# Patient Record
Sex: Male | Born: 1960 | Race: White | Hispanic: No | Smoking: Former smoker
Health system: Southern US, Community
[De-identification: ages and names within clinical notes are randomized; demographics above are authoritative.]

## PROBLEM LIST (undated history)

## (undated) DIAGNOSIS — Q8901 Asplenia (congenital): Secondary | ICD-10-CM

## (undated) DIAGNOSIS — C4370 Malignant melanoma of unspecified lower limb, including hip: Secondary | ICD-10-CM

## (undated) DIAGNOSIS — C169 Malignant neoplasm of stomach, unspecified: Secondary | ICD-10-CM

## (undated) DIAGNOSIS — S0292XA Unspecified fracture of facial bones, initial encounter for closed fracture: Secondary | ICD-10-CM

## (undated) DIAGNOSIS — C4491 Basal cell carcinoma of skin, unspecified: Secondary | ICD-10-CM

## (undated) DIAGNOSIS — C44621 Squamous cell carcinoma of skin of unspecified upper limb, including shoulder: Secondary | ICD-10-CM

## (undated) DIAGNOSIS — N529 Male erectile dysfunction, unspecified: Secondary | ICD-10-CM

## (undated) DIAGNOSIS — K219 Gastro-esophageal reflux disease without esophagitis: Secondary | ICD-10-CM

## (undated) DIAGNOSIS — Z8619 Personal history of other infectious and parasitic diseases: Secondary | ICD-10-CM

## (undated) DIAGNOSIS — T7840XA Allergy, unspecified, initial encounter: Secondary | ICD-10-CM

## (undated) DIAGNOSIS — M1990 Unspecified osteoarthritis, unspecified site: Secondary | ICD-10-CM

## (undated) DIAGNOSIS — C799 Secondary malignant neoplasm of unspecified site: Secondary | ICD-10-CM

## (undated) HISTORY — PX: NASAL SINUS SURGERY: SHX719

## (undated) HISTORY — DX: Unspecified fracture of facial bones, initial encounter for closed fracture: S02.92XA

## (undated) HISTORY — DX: Asplenia (congenital): Q89.01

## (undated) HISTORY — DX: Personal history of other infectious and parasitic diseases: Z86.19

## (undated) HISTORY — DX: Squamous cell carcinoma of skin of unspecified upper limb, including shoulder: C44.621

## (undated) HISTORY — DX: Malignant melanoma of unspecified lower limb, including hip: C43.70

## (undated) HISTORY — PX: ORIF ORBITAL FRACTURE: SHX5312

## (undated) HISTORY — PX: PALATE SURGERY: SHX729

## (undated) HISTORY — DX: Gastro-esophageal reflux disease without esophagitis: K21.9

## (undated) HISTORY — PX: MANDIBLE SURGERY: SHX707

## (undated) HISTORY — DX: Male erectile dysfunction, unspecified: N52.9

## (undated) HISTORY — DX: Basal cell carcinoma of skin, unspecified: C44.91

## (undated) HISTORY — DX: Unspecified osteoarthritis, unspecified site: M19.90

## (undated) HISTORY — DX: Allergy, unspecified, initial encounter: T78.40XA

---

## 1997-12-15 HISTORY — PX: MELANOMA EXCISION WITH SENTINEL LYMPH NODE BIOPSY: SHX5267

## 2005-07-15 ENCOUNTER — Ambulatory Visit: Payer: Self-pay | Admitting: Family Medicine

## 2005-08-29 ENCOUNTER — Ambulatory Visit: Payer: Self-pay | Admitting: Family Medicine

## 2005-10-13 ENCOUNTER — Ambulatory Visit: Payer: Self-pay | Admitting: Family Medicine

## 2005-10-27 ENCOUNTER — Encounter: Admission: RE | Admit: 2005-10-27 | Discharge: 2005-10-27 | Payer: Self-pay | Admitting: Family Medicine

## 2005-12-24 ENCOUNTER — Ambulatory Visit: Payer: Self-pay | Admitting: Family Medicine

## 2006-05-23 ENCOUNTER — Emergency Department (HOSPITAL_COMMUNITY): Admission: EM | Admit: 2006-05-23 | Discharge: 2006-05-23 | Payer: Self-pay | Admitting: Emergency Medicine

## 2006-12-15 HISTORY — PX: SPLENECTOMY, TOTAL: SHX788

## 2007-05-03 ENCOUNTER — Ambulatory Visit: Payer: Self-pay | Admitting: Family Medicine

## 2007-05-11 ENCOUNTER — Ambulatory Visit: Payer: Self-pay | Admitting: Family Medicine

## 2007-05-11 LAB — CONVERTED CEMR LAB
ALT: 20 units/L (ref 0–40)
AST: 22 units/L (ref 0–37)
Albumin: 4 g/dL (ref 3.5–5.2)
Alkaline Phosphatase: 60 units/L (ref 39–117)
BUN: 7 mg/dL (ref 6–23)
Basophils Absolute: 0 10*3/uL (ref 0.0–0.1)
Basophils Relative: 0.5 % (ref 0.0–1.0)
Bilirubin, Direct: 0.1 mg/dL (ref 0.0–0.3)
CO2: 33 meq/L — ABNORMAL HIGH (ref 19–32)
Calcium: 8.6 mg/dL (ref 8.4–10.5)
Chloride: 106 meq/L (ref 96–112)
Cholesterol: 191 mg/dL (ref 0–200)
Creatinine, Ser: 1.1 mg/dL (ref 0.4–1.5)
Eosinophils Absolute: 0.1 10*3/uL (ref 0.0–0.6)
Eosinophils Relative: 1.6 % (ref 0.0–5.0)
GFR calc Af Amer: 94 mL/min
GFR calc non Af Amer: 78 mL/min
Glucose, Bld: 91 mg/dL (ref 70–99)
HCT: 43.7 % (ref 39.0–52.0)
HDL: 45.4 mg/dL (ref >39.0)
Hemoglobin: 15.1 g/dL (ref 13.0–17.0)
LDL Cholesterol: 111 mg/dL — ABNORMAL HIGH (ref 0–99)
Lymphocytes Relative: 42.8 % (ref 12.0–46.0)
MCHC: 34.5 g/dL (ref 30.0–36.0)
MCV: 92.2 fL (ref 78.0–100.0)
Monocytes Absolute: 0.8 10*3/uL — ABNORMAL HIGH (ref 0.2–0.7)
Monocytes Relative: 10.3 % (ref 3.0–11.0)
Neutro Abs: 3.3 10*3/uL (ref 1.4–7.7)
Neutrophils Relative %: 44.8 % (ref 43.0–77.0)
Platelets: 205 10*3/uL (ref 150–400)
Potassium: 3.1 meq/L — ABNORMAL LOW (ref 3.5–5.1)
RBC: 4.74 M/uL (ref 4.22–5.81)
RDW: 12.7 % (ref 11.5–14.6)
Sodium: 143 meq/L (ref 135–145)
TSH: 1.75 microintl units/mL (ref 0.35–5.50)
Total Bilirubin: 1 mg/dL (ref 0.3–1.2)
Total CHOL/HDL Ratio: 4.2
Total Protein: 6.4 g/dL (ref 6.0–8.3)
Triglycerides: 171 mg/dL — ABNORMAL HIGH (ref 0–149)
VLDL: 34 mg/dL (ref 0–40)
WBC: 7.4 10*3/uL (ref 4.5–10.5)

## 2007-05-18 ENCOUNTER — Ambulatory Visit: Payer: Self-pay | Admitting: Family Medicine

## 2007-08-25 ENCOUNTER — Observation Stay (HOSPITAL_COMMUNITY): Admission: EM | Admit: 2007-08-25 | Discharge: 2007-08-26 | Payer: Self-pay | Admitting: *Deleted

## 2007-08-25 ENCOUNTER — Ambulatory Visit: Payer: Self-pay | Admitting: Family Medicine

## 2007-08-25 ENCOUNTER — Ambulatory Visit: Payer: Self-pay | Admitting: Internal Medicine

## 2007-08-25 DIAGNOSIS — R079 Chest pain, unspecified: Secondary | ICD-10-CM | POA: Insufficient documentation

## 2007-09-01 ENCOUNTER — Encounter: Payer: Self-pay | Admitting: Family Medicine

## 2007-09-01 ENCOUNTER — Ambulatory Visit: Payer: Self-pay

## 2007-11-04 ENCOUNTER — Encounter: Admission: RE | Admit: 2007-11-04 | Discharge: 2007-11-16 | Payer: Self-pay | Admitting: Neurology

## 2007-11-09 ENCOUNTER — Ambulatory Visit: Payer: Self-pay | Admitting: Family Medicine

## 2007-11-12 ENCOUNTER — Telehealth: Payer: Self-pay | Admitting: Family Medicine

## 2007-11-15 ENCOUNTER — Telehealth: Payer: Self-pay | Admitting: Family Medicine

## 2007-12-16 HISTORY — PX: CHOLECYSTECTOMY: SHX55

## 2008-02-28 ENCOUNTER — Ambulatory Visit: Payer: Self-pay | Admitting: Internal Medicine

## 2008-02-28 DIAGNOSIS — L259 Unspecified contact dermatitis, unspecified cause: Secondary | ICD-10-CM | POA: Insufficient documentation

## 2008-05-05 ENCOUNTER — Ambulatory Visit: Payer: Self-pay | Admitting: Internal Medicine

## 2008-05-05 LAB — CONVERTED CEMR LAB
ALT: 26 units/L (ref 0–53)
AST: 30 units/L (ref 0–37)
Albumin: 4.6 g/dL (ref 3.5–5.2)
Alkaline Phosphatase: 86 units/L (ref 39–117)
BUN: 6 mg/dL (ref 6–23)
Basophils Absolute: 0.1 10*3/uL (ref 0.0–0.1)
Basophils Relative: 0.7 % (ref 0.0–1.0)
Bilirubin, Direct: 0.1 mg/dL (ref 0.0–0.3)
Blood in Urine, dipstick: NEGATIVE
CO2: 31 meq/L (ref 19–32)
Calcium: 9.7 mg/dL (ref 8.4–10.5)
Chloride: 105 meq/L (ref 96–112)
Cholesterol: 190 mg/dL (ref 0–200)
Creatinine, Ser: 1.1 mg/dL (ref 0.4–1.5)
Eosinophils Absolute: 0.3 10*3/uL (ref 0.0–0.7)
Eosinophils Relative: 4.6 % (ref 0.0–5.0)
GFR calc Af Amer: 94 mL/min
GFR calc non Af Amer: 77 mL/min
Glucose, Bld: 78 mg/dL (ref 70–99)
Glucose, Urine, Semiquant: NEGATIVE
HCT: 47.7 % (ref 39.0–52.0)
HDL: 44.5 mg/dL (ref >39.0)
Hemoglobin: 16.1 g/dL (ref 13.0–17.0)
LDL Cholesterol: 118 mg/dL — ABNORMAL HIGH (ref 0–99)
Lymphocytes Relative: 24.9 % (ref 12.0–46.0)
MCHC: 33.8 g/dL (ref 30.0–36.0)
MCV: 93.7 fL (ref 78.0–100.0)
Monocytes Absolute: 1 10*3/uL (ref 0.1–1.0)
Monocytes Relative: 13.2 % — ABNORMAL HIGH (ref 3.0–12.0)
Neutro Abs: 4.2 10*3/uL (ref 1.4–7.7)
Neutrophils Relative %: 56.6 % (ref 43.0–77.0)
Nitrite: NEGATIVE
Platelets: 293 10*3/uL (ref 150–400)
Potassium: 4 meq/L (ref 3.5–5.1)
RBC: 5.09 M/uL (ref 4.22–5.81)
RDW: 13.6 % (ref 11.5–14.6)
Sodium: 138 meq/L (ref 135–145)
Specific Gravity, Urine: >=1.03
TSH: 1.51 microintl units/mL (ref 0.35–5.50)
Total Bilirubin: 2.9 mg/dL — ABNORMAL HIGH (ref 0.3–1.2)
Total CHOL/HDL Ratio: 4.3
Total Protein: 7.5 g/dL (ref 6.0–8.3)
Triglycerides: 137 mg/dL (ref 0–149)
Urobilinogen, UA: 0.2
VLDL: 27 mg/dL (ref 0–40)
WBC Urine, dipstick: NEGATIVE
WBC: 7.4 10*3/uL (ref 4.5–10.5)
pH: 5

## 2008-05-07 DIAGNOSIS — K1120 Sialoadenitis, unspecified: Secondary | ICD-10-CM | POA: Insufficient documentation

## 2008-05-10 ENCOUNTER — Ambulatory Visit: Payer: Self-pay | Admitting: Family Medicine

## 2008-05-10 DIAGNOSIS — J309 Allergic rhinitis, unspecified: Secondary | ICD-10-CM | POA: Insufficient documentation

## 2008-05-10 DIAGNOSIS — C44621 Squamous cell carcinoma of skin of unspecified upper limb, including shoulder: Secondary | ICD-10-CM | POA: Insufficient documentation

## 2008-05-10 DIAGNOSIS — K8020 Calculus of gallbladder without cholecystitis without obstruction: Secondary | ICD-10-CM | POA: Insufficient documentation

## 2008-05-18 ENCOUNTER — Encounter: Payer: Self-pay | Admitting: Family Medicine

## 2008-05-19 ENCOUNTER — Ambulatory Visit: Payer: Self-pay | Admitting: Family Medicine

## 2008-05-19 DIAGNOSIS — F528 Other sexual dysfunction not due to a substance or known physiological condition: Secondary | ICD-10-CM | POA: Insufficient documentation

## 2008-05-23 ENCOUNTER — Telehealth: Payer: Self-pay | Admitting: Family Medicine

## 2008-09-14 HISTORY — PX: OTHER SURGICAL HISTORY: SHX169

## 2009-01-11 ENCOUNTER — Telehealth: Payer: Self-pay | Admitting: *Deleted

## 2009-09-21 ENCOUNTER — Ambulatory Visit: Payer: Self-pay | Admitting: Family Medicine

## 2009-09-21 DIAGNOSIS — L301 Dyshidrosis [pompholyx]: Secondary | ICD-10-CM | POA: Insufficient documentation

## 2009-09-21 DIAGNOSIS — D179 Benign lipomatous neoplasm, unspecified: Secondary | ICD-10-CM | POA: Insufficient documentation

## 2009-09-21 LAB — CONVERTED CEMR LAB
ALT: 35 units/L (ref 0–53)
AST: 32 units/L (ref 0–37)
Albumin: 4.4 g/dL (ref 3.5–5.2)
Alkaline Phosphatase: 89 units/L (ref 39–117)
BUN: 7 mg/dL (ref 6–23)
Basophils Absolute: 0 10*3/uL (ref 0.0–0.1)
Basophils Relative: 0.4 % (ref 0.0–3.0)
Bilirubin Urine: NEGATIVE
Bilirubin, Direct: 0.2 mg/dL (ref 0.0–0.3)
Blood in Urine, dipstick: NEGATIVE
CO2: 22 meq/L (ref 19–32)
CRP, High Sensitivity: 8.3 — ABNORMAL HIGH (ref 0.00–5.00)
Calcium: 9.8 mg/dL (ref 8.4–10.5)
Chloride: 103 meq/L (ref 96–112)
Cholesterol: 180 mg/dL (ref 0–200)
Creatinine, Ser: 1.2 mg/dL (ref 0.4–1.5)
Eosinophils Absolute: 0.2 10*3/uL (ref 0.0–0.7)
Eosinophils Relative: 3.1 % (ref 0.0–5.0)
GFR calc non Af Amer: 69.3 mL/min (ref >60)
Glucose, Bld: 92 mg/dL (ref 70–99)
Glucose, Urine, Semiquant: NEGATIVE
HCT: 44.7 % (ref 39.0–52.0)
HDL: 42.6 mg/dL (ref >39.00)
Hemoglobin: 15.6 g/dL (ref 13.0–17.0)
Ketones, urine, test strip: NEGATIVE
LDL Cholesterol: 119 mg/dL — ABNORMAL HIGH (ref 0–99)
Lymphocytes Relative: 30.4 % (ref 12.0–46.0)
Lymphs Abs: 2.4 10*3/uL (ref 0.7–4.0)
MCHC: 34.8 g/dL (ref 30.0–36.0)
MCV: 93.2 fL (ref 78.0–100.0)
Monocytes Absolute: 0.9 10*3/uL (ref 0.1–1.0)
Monocytes Relative: 11.5 % (ref 3.0–12.0)
Neutro Abs: 4.4 10*3/uL (ref 1.4–7.7)
Neutrophils Relative %: 54.6 % (ref 43.0–77.0)
Nitrite: NEGATIVE
Platelets: 282 10*3/uL (ref 150.0–400.0)
Potassium: 5.2 meq/L — ABNORMAL HIGH (ref 3.5–5.1)
Protein, U semiquant: NEGATIVE
RBC: 4.79 M/uL (ref 4.22–5.81)
RDW: 12.6 % (ref 11.5–14.6)
Sed Rate: 6 mm/hr (ref 0–22)
Sodium: 139 meq/L (ref 135–145)
Specific Gravity, Urine: 1.02
TSH: 1.21 microintl units/mL (ref 0.35–5.50)
Total Bilirubin: 1.3 mg/dL — ABNORMAL HIGH (ref 0.3–1.2)
Total CHOL/HDL Ratio: 4
Total Protein: 7.3 g/dL (ref 6.0–8.3)
Triglycerides: 93 mg/dL (ref 0.0–149.0)
Urobilinogen, UA: 0.2
VLDL: 18.6 mg/dL (ref 0.0–40.0)
WBC Urine, dipstick: NEGATIVE
WBC: 7.9 10*3/uL (ref 4.5–10.5)
pH: 5

## 2009-09-24 ENCOUNTER — Telehealth: Payer: Self-pay | Admitting: Family Medicine

## 2009-09-25 ENCOUNTER — Encounter: Admission: RE | Admit: 2009-09-25 | Discharge: 2009-09-25 | Payer: Self-pay | Admitting: Orthopedic Surgery

## 2009-09-26 ENCOUNTER — Telehealth (INDEPENDENT_AMBULATORY_CARE_PROVIDER_SITE_OTHER): Payer: Self-pay | Admitting: *Deleted

## 2009-10-23 ENCOUNTER — Ambulatory Visit: Payer: Self-pay | Admitting: Family Medicine

## 2009-10-23 DIAGNOSIS — F411 Generalized anxiety disorder: Secondary | ICD-10-CM | POA: Insufficient documentation

## 2009-10-23 DIAGNOSIS — K2100 Gastro-esophageal reflux disease with esophagitis, without bleeding: Secondary | ICD-10-CM | POA: Insufficient documentation

## 2009-10-23 DIAGNOSIS — F17200 Nicotine dependence, unspecified, uncomplicated: Secondary | ICD-10-CM | POA: Insufficient documentation

## 2009-10-24 ENCOUNTER — Encounter (INDEPENDENT_AMBULATORY_CARE_PROVIDER_SITE_OTHER): Payer: Self-pay | Admitting: *Deleted

## 2010-05-17 ENCOUNTER — Telehealth: Payer: Self-pay | Admitting: Family Medicine

## 2011-01-16 NOTE — Progress Notes (Signed)
Summary: prednisone refill  Phone Note From Pharmacy   Summary of Call: patient would like a refill of prednisone is this okay to fill? Initial call taken by: Kern Reap CMA Duncan Dull),  May 17, 2010 2:22 PM  Follow-up for Phone Call        prednisone 20 mg, dispense 30 tablets, use as directed two refills Follow-up by: Roderick Pee MD,  May 17, 2010 2:42 PM  Additional Follow-up for Phone Call Additional follow up Details #1::        rx sent Additional Follow-up by: Kern Reap CMA Duncan Dull),  May 17, 2010 4:57 PM

## 2011-03-11 ENCOUNTER — Other Ambulatory Visit (INDEPENDENT_AMBULATORY_CARE_PROVIDER_SITE_OTHER): Payer: 59 | Admitting: Family Medicine

## 2011-03-11 DIAGNOSIS — E785 Hyperlipidemia, unspecified: Secondary | ICD-10-CM

## 2011-03-11 DIAGNOSIS — Z0000 Encounter for general adult medical examination without abnormal findings: Secondary | ICD-10-CM

## 2011-03-11 LAB — BASIC METABOLIC PANEL
BUN: 13 mg/dL (ref 6–23)
CO2: 32 mEq/L (ref 19–32)
Calcium: 9.3 mg/dL (ref 8.4–10.5)
Chloride: 105 mEq/L (ref 96–112)
Creatinine, Ser: 0.9 mg/dL (ref 0.4–1.5)
GFR: 92.41 mL/min (ref >60.00)
Glucose, Bld: 85 mg/dL (ref 70–99)
Potassium: 4.3 mEq/L (ref 3.5–5.1)
Sodium: 142 mEq/L (ref 135–145)

## 2011-03-11 LAB — HEPATIC FUNCTION PANEL
AST: 30 U/L (ref 0–37)
Albumin: 4 g/dL (ref 3.5–5.2)
Alkaline Phosphatase: 76 U/L (ref 39–117)
Total Bilirubin: 1.3 mg/dL — ABNORMAL HIGH (ref 0.3–1.2)
Total Protein: 6.9 g/dL (ref 6.0–8.3)

## 2011-03-11 LAB — CBC WITH DIFFERENTIAL/PLATELET
Basophils Absolute: 0 10*3/uL (ref 0.0–0.1)
Basophils Relative: 0.2 % (ref 0.0–3.0)
Eosinophils Absolute: 0.1 10*3/uL (ref 0.0–0.7)
HCT: 44.9 % (ref 39.0–52.0)
Hemoglobin: 15.4 g/dL (ref 13.0–17.0)
Lymphocytes Relative: 37.4 % (ref 12.0–46.0)
Lymphs Abs: 3.5 10*3/uL (ref 0.7–4.0)
MCHC: 34.4 g/dL (ref 30.0–36.0)
MCV: 94.9 fl (ref 78.0–100.0)
Neutro Abs: 4.8 10*3/uL (ref 1.4–7.7)
Neutrophils Relative %: 50.9 % (ref 43.0–77.0)
RBC: 4.73 Mil/uL (ref 4.22–5.81)
RDW: 14.1 % (ref 11.5–14.6)

## 2011-03-11 LAB — POCT URINALYSIS DIPSTICK
Bilirubin, UA: NEGATIVE
Blood, UA: NEGATIVE
Glucose, UA: NEGATIVE
Leukocytes, UA: NEGATIVE
Nitrite, UA: NEGATIVE
Spec Grav, UA: 1.03
Urobilinogen, UA: 0.2
pH, UA: 5.5

## 2011-03-11 LAB — LIPID PANEL
Cholesterol: 210 mg/dL — ABNORMAL HIGH (ref 0–200)
HDL: 60.7 mg/dL (ref >39.00)
Total CHOL/HDL Ratio: 3
VLDL: 21.2 mg/dL (ref 0.0–40.0)

## 2011-03-11 LAB — LDL CHOLESTEROL, DIRECT: Direct LDL: 139.4 mg/dL

## 2011-03-18 ENCOUNTER — Encounter: Payer: Self-pay | Admitting: Family Medicine

## 2011-03-18 ENCOUNTER — Ambulatory Visit (INDEPENDENT_AMBULATORY_CARE_PROVIDER_SITE_OTHER): Payer: 59 | Admitting: Family Medicine

## 2011-03-18 VITALS — BP 120/86 | Temp 98.4°F | Ht 69.75 in | Wt 213.0 lb

## 2011-03-18 DIAGNOSIS — L259 Unspecified contact dermatitis, unspecified cause: Secondary | ICD-10-CM

## 2011-03-18 DIAGNOSIS — F411 Generalized anxiety disorder: Secondary | ICD-10-CM

## 2011-03-18 DIAGNOSIS — Z0000 Encounter for general adult medical examination without abnormal findings: Secondary | ICD-10-CM

## 2011-03-18 DIAGNOSIS — Z23 Encounter for immunization: Secondary | ICD-10-CM

## 2011-03-18 DIAGNOSIS — F17200 Nicotine dependence, unspecified, uncomplicated: Secondary | ICD-10-CM

## 2011-03-18 DIAGNOSIS — F528 Other sexual dysfunction not due to a substance or known physiological condition: Secondary | ICD-10-CM

## 2011-03-18 DIAGNOSIS — K2100 Gastro-esophageal reflux disease with esophagitis, without bleeding: Secondary | ICD-10-CM

## 2011-03-18 DIAGNOSIS — Z136 Encounter for screening for cardiovascular disorders: Secondary | ICD-10-CM

## 2011-03-18 MED ORDER — HALOBETASOL PROPIONATE 0.05 % EX OINT: TOPICAL_OINTMENT | Freq: Two times a day (BID) | CUTANEOUS | Status: DC

## 2011-03-18 MED ORDER — PREDNISONE 20 MG PO TABS: ORAL_TABLET | ORAL | Status: DC

## 2011-03-18 MED ORDER — VARENICLINE TARTRATE 1 MG PO TABS: 1.0000 mg | ORAL_TABLET | Freq: Two times a day (BID) | ORAL | Status: AC

## 2011-03-18 MED ORDER — DIAZEPAM 2 MG PO TABS: 2.0000 mg | ORAL_TABLET | Freq: Two times a day (BID) | ORAL | Status: DC

## 2011-03-18 MED ORDER — ESOMEPRAZOLE MAGNESIUM 20 MG PO CPDR: 20.0000 mg | DELAYED_RELEASE_CAPSULE | Freq: Every day | ORAL | Status: DC

## 2011-03-18 MED ORDER — SILDENAFIL CITRATE 100 MG PO TABS: 100.0000 mg | ORAL_TABLET | Freq: Every day | ORAL | Status: DC | PRN
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
## 2011-03-18 NOTE — Patient Instructions (Signed)
Continued the 10 mg of prednisone, Monday, Wednesday, Friday.  Beginning the chantix program one half tablet daily in the morning.  Switch from the Prilosec which doesn't seem to help to the Nexium 20 mg.  Return in one year, sooner if any problems.  Return sometime in the next two to 4 weeks for removal of the lesion under left forearm

## 2011-04-08 ENCOUNTER — Encounter: Payer: Self-pay | Admitting: Family Medicine

## 2011-04-08 ENCOUNTER — Ambulatory Visit (INDEPENDENT_AMBULATORY_CARE_PROVIDER_SITE_OTHER): Payer: 59 | Admitting: Family Medicine

## 2011-04-08 DIAGNOSIS — L989 Disorder of the skin and subcutaneous tissue, unspecified: Secondary | ICD-10-CM

## 2011-04-08 DIAGNOSIS — L570 Actinic keratosis: Secondary | ICD-10-CM

## 2011-04-08 DIAGNOSIS — C44621 Squamous cell carcinoma of skin of unspecified upper limb, including shoulder: Secondary | ICD-10-CM

## 2011-04-08 NOTE — Patient Instructions (Signed)
Remove the Band-Aid tomorrow.  If in two weeks.  We do not call U. The report to you call me

## 2011-04-08 NOTE — Progress Notes (Signed)
  Subjective:    Patient ID: [name], male    DOB: 1960/12/30, 50 y.o.   MRN: [id number]  HPIScott is a 50 year old male, who comes in today for removal of a lesion on his left hand.  He said a history of squamous cell carcinomas and one.  Melanoma.  The lesion on his left hand measures 10-mm times 10 mm  After informed consent, the area was cleaned with alcohol anesthetized with Xylocaine and the lesion was removed in toto with 3-mm margins.  The base was cauterized.  A Band-Aid was applied.  The lesion was sent for pathologic analysis.  He also had two crusted lesion on his right forehand.  There were anesthetized and cauterized.  These are probably some actinic keratosis.  The just wouldn't heal    Review of Systems    General and dermatologic review of systems otherwise negative Objective:   Physical Exam    Procedure as above    Assessment & Plan:  10 mm x 10 mm lesion, left forehand Path pending

## 2011-04-29 NOTE — H&P (Signed)
NAMECHARLES, [name] NO.:  [id number]   MEDICAL RECORD NO.:  [id number]          PATIENT TYPE:  EMS   LOCATION:  MAJO                         FACILITY:  [facility]   PHYSICIAN:  Bevelyn Buckles. Bensimhon, MDDATE OF BIRTH:  10/31/1961   DATE OF ADMISSION:  08/25/2007  DATE OF DISCHARGE:                              HISTORY & PHYSICAL   PRIMARY CARE PHYSICIAN:  Dr. Kelle Darting.   PRIMARY CARDIOLOGIST:  He is new to [facility], being seen by  Dr. Arvilla Meres.   PATIENT PROFILE:  A 50 year old, married, Caucasian male with history of  tobacco abuse who presents to the ED following an episode of chest  pressure and left arm numbness.   PROBLEM LIST:  1. Chest pain and left arm numbness.  2. History of allergic rhinitis.  3. History of malignant melanoma level III, on the left side, status      post excision in 1999.  4. History of erectile dysfunction.  5. Ongoing tobacco abuse.  6. History of small calcified granuloma of the right middle lobe noted      on chest x-ray in November 2006.   HISTORY OF PRESENT ILLNESS:  A 50 year old, married, Caucasian male  without prior history of CAD.  He does have a history of tobacco abuse.  There is no family history of CAD.  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  Today while showering, he developed a very  mild left chest pain with some burning, associated with left arm  numbness.  The left chest tightness lasted just a few minutes, and was  gone prior to him finishing his shower.  The arm/hand numbness persisted  for approximately 3.5 hours, and was slightly better by shaking his arm.  He decided to present to his primary care [name] for additional  evaluation, and prior to arriving to the office, he was completely  asymptomatic.  At the primary care's office an ECG was performed, which  showed T wave inversion in the AVL, but  otherwise a normal ECG without  acute ST or T changes.  He was sent to the ED for cardiac evaluation,  and he is currently completely asymptomatic.   ALLERGIES:  Cefazolin.   HOME MEDICATIONS:  1. Nasonex 50 mcg spray, 2 sprays each nostril.  2. Zyrtec 10 mg daily.   FAMILY HISTORY:  Mother is age 68, and alive and well.  Father is age  10, and alive and well.  He has a sister who is obese, but otherwise  alive and well.   SOCIAL HISTORY:  He lives in [place] with his wife and 3 children.  He works as a Geophysical data processor.  He has a greater than 25-pack-year  history of tobacco abuse, stating that he has always smoked just over a  pack a day for the past 25 years.  He drinks less than a 6-pack of beer  per week.  He denies any drug use.   REVIEW OF SYSTEMS:  Positive for chest pain, and the left arm and  hand  numbness.  Otherwise, all systems reviewed and negative.   PHYSICAL EXAMINATION:  VITAL SIGNS:  Temperature 98.4, heart rate 56,  respirations 18, blood pressure 136/84, pulse ox 98% on 2 liters.  GENERAL:  Pleasant, white male in no acute distress.  Awake, alert and  oriented x3.  HEENT:  Normal.  NEURO:  Grossly intact and nonfocal.  NECK:  No bruits or JVD.  LUNGS:  Respirations are unlabored , clear to auscultation.  CARDIAC:  Regular S1, S2.  No S3, S4 or murmurs.  ABDOMEN:  Soft, nontender, nondistended.  Bowel sounds present x4.  EXTREMITIES:  Warm, dry, pink.  No clubbing, cyanosis or edema.  Dorsalis pedis and posterior tibialis pulses are 2+ bilaterally.   CLINICAL FINDINGS:  A chest x-ray is pending.  EKG shows sinus  bradycardia at a rate of 53, but normal axis and no acute ST or T  changes.  Lab work is pending.   ASSESSMENT AND PLAN:  1. Chest pressure and left arm numbness:  Plan to observe and cycle      cardiac markers.  He is currently asymptomatic.  His enzymes are      negative.  Plan for discharge in the morning with outpatient      Myoview.   If enzymes are positive, we will plan for inpatient      cardiac catheterization.  We will add aspirin, and hold off on      heparin unless enzymes become abnormal.  2. Tobacco abuse:  Smoking cessation strongly advised.  We will obtain      an inpatient tobacco cessation consult.  3. History of allergic rhinitis:  Continue home medications.      [name], ANP      Bevelyn Buckles. Bensimhon, MD  Electronically Signed    CB/MEDQ  D:  08/25/2007  T:  08/25/2007  Job:  [id number]

## 2011-04-29 NOTE — Discharge Summary (Signed)
[name], [name] NO.:  [id number]   MEDICAL RECORD NO.:  [id number]          PATIENT TYPE:  INP   LOCATION:  2038                         FACILITY:  [facility]   PHYSICIAN:  [name], MDDATE OF BIRTH:  1961-11-24   DATE OF ADMISSION:  08/25/2007  DATE OF DISCHARGE:  08/26/2007                               DISCHARGE SUMMARY   PRIMARY CARE PHYSICIAN:  [name], M.D.   PRIMARY CARDIOLOGIST:  [name], M.D.   DISCHARGE FINAL DIAGNOSIS:  Chest pain.   SECONDARY DIAGNOSES:  1. Left arm numbness.  2. Allergic rhinitis.  3. History of small calcified granuloma of the right middle lobe first      noted on chest x-ray November, 2006.  4. History of level III malignant melanoma of the left thigh status      post excision 1999.  5. History of erectile dysfunction.  6. Ongoing tobacco abuse.   ALLERGIES:  CEFAZOLIN.   PROCEDURES:  None.   HISTORY OF PRESENT ILLNESS:  This is a 50 year old married Caucasian  male without prior history of CAD.  He was in his usual state of health  when showering on the morning of August 25, 2007 when he developed  very mild left chest discomfort and burning with numbness down his left  arm.  The chest discomfort lasted just a couple of minutes and resolved  prior to him being done in the shower, while the numbness persisted for  approximately 3-1/2 hours with slight improvement by shaking his arm.  He presented to his primary care [name], Dr. Tawanna Newman, and by the time he  arrived in the office all symptoms had resolved.  He was sent to the ED  for further evaluation.  In the emergency room he had no additional  chest pain. He was admitted for rule out.   HOSPITAL COURSE:  [name] ruled out for myocardial infarction and his  ECG continues to show no acute changes.  He has had no additional chest  discomfort or arm numbness.  Question if cervical radiculopathy could  potentially be playing a role in  his symptoms yesterday.  To further  rule out ischemia we will obtain an outpatient exercise Myoview in our  office on September 01, 2007 at 7:15 A.M.  He is otherwise being  discharged home today in satisfactory condition.   The patient was counseled on the importance of smoking cessation and  shows no motivation to quit.   DISCHARGE LABORATORY DATA:  Hemoglobin 16.2, hematocrit 48.0, white  blood cell count 7.7, platelet count 215,000.  Sodium 139, potassium  4.0, chloride 100, CO2 32, BUN 8, creatinine 0.96, glucose 113.  Total  bilirubin 2.0, alkaline phosphatase 64, AST 31, ALT 21, calcium 9.1.  Cardiac enzymes negative X3.  Albumin 4.2. Total cholesterol 170,  triglycerides 153, HDL 43, LDL 100.  TSH 0.883.  Chest x-ray shows no  active cardiopulmonary process with possible tiny granuloma in the right  lung, which had been previously noted.   DISPOSITION:  The patient is being discharged home today in good  condition.  FOLLOWUP PLANS AND APPOINTMENTS:  He will follow up in our office on  September 01, 2007 at 7:15 A.M. for an exercise Myoview.  He will then  followup with cardiology p.r.n. dependent upon the results of that test.  He is asked to follow up with Dr. Tawanna Newman as previously scheduled.   DISCHARGE MEDICATIONS:  1. Nasonex 50 mcg two sprays each nostril daily.  2. Zyrtec 10 mg daily.   OUTSTANDING LABORATORY STUDIES:  None.   Duration discharge encounter 40 minutes including physician time.      [name], ANP      Jon Buckles. Bensimhon, MD  Electronically Signed    CB/MEDQ  D:  08/26/2007  T:  08/26/2007  Job:  [id number]   cc:   Jon Gens A. Tawanna Cooler, MD

## 2011-09-26 LAB — PROTIME-INR: INR: 1

## 2011-09-26 LAB — CBC
HCT: 48
Hemoglobin: 16.2
MCHC: 33.7
MCV: 92.7
Platelets: 215
RBC: 5.19
RDW: 13.1
WBC: 7.7

## 2011-09-26 LAB — LIPID PANEL
Cholesterol: 170
HDL: 43

## 2011-09-26 LAB — CK TOTAL AND CKMB (NOT AT ARMC)
CK, MB: 1.6
Relative Index: 0.7
Total CK: 217

## 2011-09-26 LAB — COMPREHENSIVE METABOLIC PANEL
ALT: 21
AST: 31
Albumin: 4.2
Alkaline Phosphatase: 64
BUN: 8
CO2: 32
Calcium: 9.1
Chloride: 100
Creatinine, Ser: 0.96
GFR calc Af Amer: >60
GFR calc non Af Amer: >60
Glucose, Bld: 113 — ABNORMAL HIGH
Potassium: 4
Sodium: 139
Total Bilirubin: 2 — ABNORMAL HIGH
Total Protein: 6.8

## 2011-09-26 LAB — DIFFERENTIAL
Basophils Relative: 1
Lymphs Abs: 2.1
Monocytes Relative: 8
Neutro Abs: 4.4
Neutrophils Relative %: 57

## 2011-09-26 LAB — APTT: aPTT: 28

## 2011-09-26 LAB — CARDIAC PANEL(CRET KIN+CKTOT+MB+TROPI)
Relative Index: 0.8
Troponin I: 0.01
Troponin I: 0.01

## 2012-03-15 ENCOUNTER — Other Ambulatory Visit (INDEPENDENT_AMBULATORY_CARE_PROVIDER_SITE_OTHER): Payer: 59

## 2012-03-15 DIAGNOSIS — Z0000 Encounter for general adult medical examination without abnormal findings: Secondary | ICD-10-CM

## 2012-03-15 DIAGNOSIS — F528 Other sexual dysfunction not due to a substance or known physiological condition: Secondary | ICD-10-CM

## 2012-03-15 LAB — BASIC METABOLIC PANEL
BUN: 12 mg/dL (ref 6–23)
CO2: 31 mEq/L (ref 19–32)
Chloride: 103 mEq/L (ref 96–112)
GFR: 84.62 mL/min (ref >60.00)
Glucose, Bld: 88 mg/dL (ref 70–99)
Potassium: 3.6 mEq/L (ref 3.5–5.1)

## 2012-03-15 LAB — CBC WITH DIFFERENTIAL/PLATELET
Basophils Absolute: 0 10*3/uL (ref 0.0–0.1)
Eosinophils Absolute: 0.1 10*3/uL (ref 0.0–0.7)
HCT: 44.7 % (ref 39.0–52.0)
Lymphs Abs: 3.8 10*3/uL (ref 0.7–4.0)
MCHC: 33.1 g/dL (ref 30.0–36.0)
MCV: 94.4 fl (ref 78.0–100.0)
Monocytes Absolute: 1.1 10*3/uL — ABNORMAL HIGH (ref 0.1–1.0)
Platelets: 262 10*3/uL (ref 150.0–400.0)
RDW: 13.6 % (ref 11.5–14.6)

## 2012-03-15 LAB — POCT URINALYSIS DIPSTICK
Bilirubin, UA: NEGATIVE
Blood, UA: NEGATIVE
Glucose, UA: NEGATIVE
Ketones, UA: NEGATIVE
Protein, UA: NEGATIVE
Spec Grav, UA: 1.02
pH, UA: 6

## 2012-03-15 LAB — LIPID PANEL
Cholesterol: 183 mg/dL (ref 0–200)
LDL Cholesterol: 107 mg/dL — ABNORMAL HIGH (ref 0–99)
Total CHOL/HDL Ratio: 3
VLDL: 17 mg/dL (ref 0.0–40.0)

## 2012-03-15 LAB — TSH: TSH: 0.75 u[IU]/mL (ref 0.35–5.50)

## 2012-03-15 LAB — HEPATIC FUNCTION PANEL: Total Bilirubin: 1.5 mg/dL — ABNORMAL HIGH (ref 0.3–1.2)

## 2012-03-15 NOTE — Progress Notes (Signed)
Addended by: Bonnye Fava on: 03/15/2012 09:02 AM   Modules accepted: Orders

## 2012-03-22 ENCOUNTER — Ambulatory Visit (INDEPENDENT_AMBULATORY_CARE_PROVIDER_SITE_OTHER): Payer: 59 | Admitting: Family Medicine

## 2012-03-22 ENCOUNTER — Encounter: Payer: Self-pay | Admitting: Family Medicine

## 2012-03-22 VITALS — BP 140/90 | Temp 97.5°F | Ht 71.0 in | Wt 222.0 lb

## 2012-03-22 DIAGNOSIS — K2100 Gastro-esophageal reflux disease with esophagitis, without bleeding: Secondary | ICD-10-CM

## 2012-03-22 DIAGNOSIS — F528 Other sexual dysfunction not due to a substance or known physiological condition: Secondary | ICD-10-CM

## 2012-03-22 DIAGNOSIS — Z23 Encounter for immunization: Secondary | ICD-10-CM

## 2012-03-22 DIAGNOSIS — J309 Allergic rhinitis, unspecified: Secondary | ICD-10-CM

## 2012-03-22 DIAGNOSIS — F17200 Nicotine dependence, unspecified, uncomplicated: Secondary | ICD-10-CM

## 2012-03-22 DIAGNOSIS — L259 Unspecified contact dermatitis, unspecified cause: Secondary | ICD-10-CM

## 2012-03-22 DIAGNOSIS — Z85828 Personal history of other malignant neoplasm of skin: Secondary | ICD-10-CM

## 2012-03-22 DIAGNOSIS — L301 Dyshidrosis [pompholyx]: Secondary | ICD-10-CM

## 2012-03-22 MED ORDER — HALOBETASOL PROPIONATE 0.05 % EX OINT: TOPICAL_OINTMENT | Freq: Two times a day (BID) | CUTANEOUS | Status: DC

## 2012-03-22 MED ORDER — SILDENAFIL CITRATE 100 MG PO TABS: 100.0000 mg | ORAL_TABLET | Freq: Every day | ORAL | Status: DC | PRN

## 2012-03-22 MED ORDER — PREDNISONE 20 MG PO TABS: ORAL_TABLET | ORAL | Status: DC

## 2012-03-22 MED ORDER — ESOMEPRAZOLE MAGNESIUM 20 MG PO CPDR: 20.0000 mg | DELAYED_RELEASE_CAPSULE | Freq: Every day | ORAL | Status: DC
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
## 2012-03-22 NOTE — Patient Instructions (Signed)
Continuing your current medications  I would recommend you see a dermatologist because of your history of melanoma  Congratulations on not smoking anymore  Return in one year sooner if any problems

## 2012-04-14 ENCOUNTER — Other Ambulatory Visit: Payer: Self-pay | Admitting: Family Medicine

## 2012-04-19 ENCOUNTER — Ambulatory Visit (INDEPENDENT_AMBULATORY_CARE_PROVIDER_SITE_OTHER): Payer: 59 | Admitting: Family Medicine

## 2012-04-19 DIAGNOSIS — Z0000 Encounter for general adult medical examination without abnormal findings: Secondary | ICD-10-CM

## 2012-04-19 DIAGNOSIS — Z23 Encounter for immunization: Secondary | ICD-10-CM

## 2012-05-07 ENCOUNTER — Other Ambulatory Visit: Payer: Self-pay | Admitting: Family Medicine

## 2012-09-08 ENCOUNTER — Telehealth: Payer: Self-pay | Admitting: Family Medicine

## 2012-09-08 NOTE — Telephone Encounter (Signed)
Pt called and is sch for final hep a b vax on 09/20/12. 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 Pls advise.

## 2012-09-09 NOTE — Telephone Encounter (Signed)
Okay to schedule Hep B 1 week early

## 2012-09-10 NOTE — Telephone Encounter (Signed)
Called pt and rsc vax as noted.

## 2012-09-13 ENCOUNTER — Ambulatory Visit (INDEPENDENT_AMBULATORY_CARE_PROVIDER_SITE_OTHER): Payer: 59 | Admitting: Family Medicine

## 2012-09-13 DIAGNOSIS — Z23 Encounter for immunization: Secondary | ICD-10-CM

## 2012-09-13 DIAGNOSIS — Z0000 Encounter for general adult medical examination without abnormal findings: Secondary | ICD-10-CM

## 2012-09-20 ENCOUNTER — Ambulatory Visit: Payer: 59 | Admitting: Family Medicine

## 2013-01-02 ENCOUNTER — Other Ambulatory Visit: Payer: Self-pay | Admitting: Family Medicine

## 2013-03-25 ENCOUNTER — Other Ambulatory Visit (INDEPENDENT_AMBULATORY_CARE_PROVIDER_SITE_OTHER): Payer: 59

## 2013-03-25 DIAGNOSIS — Z0000 Encounter for general adult medical examination without abnormal findings: Secondary | ICD-10-CM

## 2013-03-25 LAB — CBC WITH DIFFERENTIAL/PLATELET
Basophils Absolute: 0 10*3/uL (ref 0.0–0.1)
Hemoglobin: 14.8 g/dL (ref 13.0–17.0)
Lymphocytes Relative: 25.9 % (ref 12.0–46.0)
Monocytes Relative: 14.7 % — ABNORMAL HIGH (ref 3.0–12.0)
Platelets: 271 10*3/uL (ref 150.0–400.0)
RDW: 14.6 % (ref 11.5–14.6)
WBC: 7.2 10*3/uL (ref 4.5–10.5)

## 2013-03-25 LAB — POCT URINALYSIS DIPSTICK
Bilirubin, UA: NEGATIVE
Blood, UA: NEGATIVE
Glucose, UA: NEGATIVE
Spec Grav, UA: >=1.03

## 2013-03-25 LAB — LIPID PANEL
HDL: 56.8 mg/dL (ref >39.00)
LDL Cholesterol: 120 mg/dL — ABNORMAL HIGH (ref 0–99)
Total CHOL/HDL Ratio: 4
Triglycerides: 114 mg/dL (ref 0.0–149.0)

## 2013-03-25 LAB — HEPATIC FUNCTION PANEL
AST: 29 U/L (ref 0–37)
Alkaline Phosphatase: 71 U/L (ref 39–117)
Total Bilirubin: 1.7 mg/dL — ABNORMAL HIGH (ref 0.3–1.2)

## 2013-03-25 LAB — BASIC METABOLIC PANEL
BUN: 10 mg/dL (ref 6–23)
Calcium: 8.7 mg/dL (ref 8.4–10.5)
GFR: 95.16 mL/min (ref >60.00)
Glucose, Bld: 89 mg/dL (ref 70–99)
Sodium: 136 mEq/L (ref 135–145)

## 2013-03-29 ENCOUNTER — Encounter: Payer: Self-pay | Admitting: Family Medicine

## 2013-03-29 ENCOUNTER — Ambulatory Visit (INDEPENDENT_AMBULATORY_CARE_PROVIDER_SITE_OTHER): Payer: 59 | Admitting: Family Medicine

## 2013-03-29 VITALS — BP 120/84 | Temp 98.2°F | Ht 72.0 in | Wt 197.0 lb

## 2013-03-29 DIAGNOSIS — J309 Allergic rhinitis, unspecified: Secondary | ICD-10-CM

## 2013-03-29 DIAGNOSIS — F17200 Nicotine dependence, unspecified, uncomplicated: Secondary | ICD-10-CM

## 2013-03-29 DIAGNOSIS — K2100 Gastro-esophageal reflux disease with esophagitis, without bleeding: Secondary | ICD-10-CM

## 2013-03-29 DIAGNOSIS — R0602 Shortness of breath: Secondary | ICD-10-CM

## 2013-03-29 DIAGNOSIS — F528 Other sexual dysfunction not due to a substance or known physiological condition: Secondary | ICD-10-CM

## 2013-03-29 DIAGNOSIS — L301 Dyshidrosis [pompholyx]: Secondary | ICD-10-CM

## 2013-03-29 DIAGNOSIS — Z85828 Personal history of other malignant neoplasm of skin: Secondary | ICD-10-CM

## 2013-03-29 DIAGNOSIS — L259 Unspecified contact dermatitis, unspecified cause: Secondary | ICD-10-CM

## 2013-03-29 MED ORDER — HALOBETASOL PROPIONATE 0.05 % EX OINT: TOPICAL_OINTMENT | CUTANEOUS | Status: DC

## 2013-03-29 MED ORDER — ALBUTEROL SULFATE HFA 108 (90 BASE) MCG/ACT IN AERS: INHALATION_SPRAY | RESPIRATORY_TRACT | Status: DC

## 2013-03-29 MED ORDER — PREDNISONE 20 MG PO TABS: ORAL_TABLET | ORAL | Status: DC

## 2013-03-29 MED ORDER — ESOMEPRAZOLE MAGNESIUM 20 MG PO CPDR: DELAYED_RELEASE_CAPSULE | ORAL | Status: DC

## 2013-03-29 MED ORDER — SILDENAFIL CITRATE 100 MG PO TABS: 100.0000 mg | ORAL_TABLET | Freq: Every day | ORAL | Status: DC | PRN
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
## 2013-03-29 NOTE — Patient Instructions (Addendum)
Continue previous medicines  Continue diet and exercise program  Albuterol,,,,,,,,,, 1 puff 15 minutes prior to exercise  Return in one year sooner if any problems

## 2013-05-19 ENCOUNTER — Other Ambulatory Visit: Payer: 59

## 2013-05-26 ENCOUNTER — Encounter: Payer: 59 | Admitting: Family Medicine

## 2013-12-15 HISTORY — PX: OTHER SURGICAL HISTORY: SHX169

## 2014-03-23 ENCOUNTER — Other Ambulatory Visit (INDEPENDENT_AMBULATORY_CARE_PROVIDER_SITE_OTHER): Payer: BC Managed Care – PPO

## 2014-03-23 DIAGNOSIS — Z0000 Encounter for general adult medical examination without abnormal findings: Secondary | ICD-10-CM

## 2014-03-23 LAB — POCT URINALYSIS DIPSTICK
Bilirubin, UA: NEGATIVE
Blood, UA: NEGATIVE
Glucose, UA: NEGATIVE
KETONES UA: NEGATIVE
Leukocytes, UA: NEGATIVE
Nitrite, UA: NEGATIVE
PH UA: 6.5
Protein, UA: NEGATIVE
SPEC GRAV UA: 1.02
Urobilinogen, UA: 0.2

## 2014-03-23 LAB — TSH: TSH: 1.17 u[IU]/mL (ref 0.35–5.50)

## 2014-03-23 LAB — LIPID PANEL
Cholesterol: 162 mg/dL (ref 0–200)
HDL: 45.7 mg/dL (ref >39.00)
LDL CALC: 103 mg/dL — AB (ref 0–99)
Total CHOL/HDL Ratio: 4
Triglycerides: 69 mg/dL (ref 0.0–149.0)
VLDL: 13.8 mg/dL (ref 0.0–40.0)

## 2014-03-23 LAB — BASIC METABOLIC PANEL
BUN: 12 mg/dL (ref 6–23)
CHLORIDE: 99 meq/L (ref 96–112)
CO2: 31 mEq/L (ref 19–32)
Calcium: 9.6 mg/dL (ref 8.4–10.5)
Creatinine, Ser: 0.9 mg/dL (ref 0.4–1.5)
GFR: 99.88 mL/min (ref >60.00)
GLUCOSE: 91 mg/dL (ref 70–99)
POTASSIUM: 4.4 meq/L (ref 3.5–5.1)
SODIUM: 139 meq/L (ref 135–145)

## 2014-03-23 LAB — CBC WITH DIFFERENTIAL/PLATELET
Basophils Absolute: 0 10*3/uL (ref 0.0–0.1)
Basophils Relative: 0.7 % (ref 0.0–3.0)
EOS PCT: 2.9 % (ref 0.0–5.0)
Eosinophils Absolute: 0.2 10*3/uL (ref 0.0–0.7)
HEMATOCRIT: 42.2 % (ref 39.0–52.0)
HEMOGLOBIN: 14 g/dL (ref 13.0–17.0)
LYMPHS ABS: 2.2 10*3/uL (ref 0.7–4.0)
Lymphocytes Relative: 36 % (ref 12.0–46.0)
MCHC: 33.2 g/dL (ref 30.0–36.0)
MCV: 93.3 fl (ref 78.0–100.0)
MONO ABS: 1 10*3/uL (ref 0.1–1.0)
MONOS PCT: 16.1 % — AB (ref 3.0–12.0)
NEUTROS ABS: 2.8 10*3/uL (ref 1.4–7.7)
Neutrophils Relative %: 44.3 % (ref 43.0–77.0)
PLATELETS: 423 10*3/uL — AB (ref 150.0–400.0)
RBC: 4.52 Mil/uL (ref 4.22–5.81)
RDW: 13.6 % (ref 11.5–14.6)
WBC: 6.2 10*3/uL (ref 4.5–10.5)

## 2014-03-23 LAB — HEPATIC FUNCTION PANEL
ALBUMIN: 4 g/dL (ref 3.5–5.2)
ALK PHOS: 94 U/L (ref 39–117)
ALT: 19 U/L (ref 0–53)
AST: 27 U/L (ref 0–37)
BILIRUBIN DIRECT: 0.2 mg/dL (ref 0.0–0.3)
Total Bilirubin: 1.1 mg/dL (ref 0.3–1.2)
Total Protein: 6.8 g/dL (ref 6.0–8.3)

## 2014-03-23 LAB — PSA: PSA: 0.91 ng/mL (ref 0.10–4.00)

## 2014-03-30 ENCOUNTER — Encounter: Payer: Self-pay | Admitting: Family Medicine

## 2014-03-30 ENCOUNTER — Ambulatory Visit (INDEPENDENT_AMBULATORY_CARE_PROVIDER_SITE_OTHER): Payer: BC Managed Care – PPO | Admitting: Family Medicine

## 2014-03-30 VITALS — BP 110/80 | Temp 97.9°F | Ht 70.0 in | Wt 194.0 lb

## 2014-03-30 DIAGNOSIS — K2100 Gastro-esophageal reflux disease with esophagitis, without bleeding: Secondary | ICD-10-CM

## 2014-03-30 DIAGNOSIS — Z85828 Personal history of other malignant neoplasm of skin: Secondary | ICD-10-CM

## 2014-03-30 DIAGNOSIS — F528 Other sexual dysfunction not due to a substance or known physiological condition: Secondary | ICD-10-CM

## 2014-03-30 DIAGNOSIS — Z0000 Encounter for general adult medical examination without abnormal findings: Secondary | ICD-10-CM

## 2014-03-30 DIAGNOSIS — Z7189 Other specified counseling: Secondary | ICD-10-CM | POA: Insufficient documentation

## 2014-03-30 DIAGNOSIS — L259 Unspecified contact dermatitis, unspecified cause: Secondary | ICD-10-CM

## 2014-03-30 MED ORDER — ESOMEPRAZOLE MAGNESIUM 20 MG PO CPDR: DELAYED_RELEASE_CAPSULE | ORAL | Status: DC

## 2014-03-30 MED ORDER — HALOBETASOL PROPIONATE 0.05 % EX OINT: TOPICAL_OINTMENT | CUTANEOUS | Status: DC

## 2014-03-30 MED ORDER — SILDENAFIL CITRATE 100 MG PO TABS: 100.0000 mg | ORAL_TABLET | Freq: Every day | ORAL | Status: DC | PRN

## 2014-03-30 MED ORDER — PREDNISONE 20 MG PO TABS: ORAL_TABLET | ORAL | Status: DC

## 2014-03-30 NOTE — Progress Notes (Signed)
Pre visit review using our clinic review tool, if applicable. No additional management support is needed unless otherwise documented below in the visit note. 

## 2014-03-30 NOTE — Patient Instructions (Signed)
I would recommend you motor round on something that has 4 wheels in the future  Zyrtec 10 mg each bedtime Nexium 20 mg daily  Prednisone 10 mg Monday Wednesday Friday along with Ultravate and udder cream small amounts twice daily for your eczema  Viagra 100 mg.......... [email or web address].
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
## 2014-04-07 ENCOUNTER — Telehealth: Payer: Self-pay | Admitting: Family Medicine

## 2014-04-07 ENCOUNTER — Ambulatory Visit (INDEPENDENT_AMBULATORY_CARE_PROVIDER_SITE_OTHER): Payer: BC Managed Care – PPO | Admitting: Family Medicine

## 2014-04-07 ENCOUNTER — Encounter: Payer: Self-pay | Admitting: Family Medicine

## 2014-04-07 VITALS — BP 126/80 | HR 60 | Temp 98.5°F | Wt 192.0 lb

## 2014-04-07 DIAGNOSIS — W57XXXA Bitten or stung by nonvenomous insect and other nonvenomous arthropods, initial encounter: Secondary | ICD-10-CM

## 2014-04-07 DIAGNOSIS — M2550 Pain in unspecified joint: Secondary | ICD-10-CM

## 2014-04-07 DIAGNOSIS — T148 Other injury of unspecified body region: Secondary | ICD-10-CM

## 2014-04-07 LAB — SEDIMENTATION RATE: Sed Rate: 6 mm/hr (ref 0–22)

## 2014-04-07 MED ORDER — DOXYCYCLINE HYCLATE 100 MG PO CAPS: 100.0000 mg | ORAL_CAPSULE | Freq: Two times a day (BID) | ORAL | Status: DC
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
## 2014-04-07 NOTE — Patient Instructions (Signed)
If you have fever, confusion, neck stiffness, extreme headache, loss of coordination or focal weakness seek medical treatment immediately.   Lyme Disease You may have been bitten by a tick and are to watch for the development of Lyme Disease. Lyme Disease is an infection that is caused by a bacteria The bacteria causing this disease is named Borreilia burgdorferi. If a tick is infected with this bacteria and then bites you, then Lyme Disease may occur. These ticks are carried by deer and rodents such as rabbits and mice and infest grassy as well as forested areas. Fortunately most tick bites do not cause Lyme Disease.  Lyme Disease is easier to prevent than to treat. First, covering your legs with clothing when walking in areas where ticks are possibly abundant will prevent their attachment because ticks tend to stay within inches of the ground. Second, using insecticides containing DEET can be applied on skin or clothing. Last, because it takes about 12 to 24 hours for the tick to transmit the disease after attachment to the human host, you should inspect your body for ticks twice a day when you are in areas where Lyme Disease is common. You must look thoroughly when searching for ticks. The Ixodes tick that carries Lyme Disease is very small. It is around the size of a sesame seed (picture of tick is not actual size). Removal is best done by grasping the tick by the head and pulling it out. Do not to squeeze the body of the tick. This could inject the infecting bacteria into the bite site. Wash the area of the bite with an antiseptic solution after removal.  Lyme Disease is a disease that may affect many body systems. Because of the small size of the biting tick, most people do not notice being bitten. The first sign of an infection is usually a round red rash that extends out from the center of the tick bite. The center of the lesion may be blood colored (hemorrhagic) or have tiny blisters (vesicular). Most  lesions have bright red outer borders and partial central clearing. This rash may extend out many inches in diameter, and multiple lesions may be present. Other symptoms such as fatigue, headaches, chills and fever, general achiness and swelling of lymph glands may also occur. If this first stage of the disease is left untreated, these symptoms may gradually resolve by themselves, or progressive symptoms may occur because of spread of infection to other areas of the body.  Follow up with your caregiver to have testing and treatment if you have a tick bite and you develop any of the above complaints. Your caregiver may recommend preventative (prophylactic) medications which kill bacteria (antibiotics). Once a diagnosis of Lyme Disease is made, antibiotic treatment is highly likely to cure the disease. Effective treatment of late stage Lyme Disease may require longer courses of antibiotic therapy.  MAKE SURE YOU:   Understand these instructions.  Will watch your condition.  Will get help right away if you are not doing well or get worse. Document Released: 03/09/2001 Document Revised: 02/23/2012 Document Reviewed: 05/11/2009 [facility] Patient Information 2014 [place], [place].

## 2014-04-07 NOTE — Telephone Encounter (Signed)
Noted  

## 2014-04-07 NOTE — Progress Notes (Signed)
Pre visit review using our clinic review tool, if applicable. No additional management support is needed unless otherwise documented below in the visit note. 

## 2014-04-10 LAB — B. BURGDORFI ANTIBODIES: B burgdorferi Ab IgG+IgM: 0.13 {ISR}

## 2014-04-10 LAB — CYCLIC CITRUL PEPTIDE ANTIBODY, IGG: Cyclic Citrullin Peptide Ab: <2 U/mL (ref 0.0–5.0)

## 2014-04-10 LAB — ANA: Anti Nuclear Antibody(ANA): NEGATIVE

## 2014-05-01 ENCOUNTER — Encounter: Payer: Self-pay | Admitting: Family Medicine

## 2014-07-25 ENCOUNTER — Encounter: Payer: Self-pay | Admitting: Family Medicine

## 2014-07-25 ENCOUNTER — Telehealth: Payer: Self-pay | Admitting: Family Medicine

## 2014-07-25 ENCOUNTER — Ambulatory Visit (INDEPENDENT_AMBULATORY_CARE_PROVIDER_SITE_OTHER): Payer: BC Managed Care – PPO | Admitting: Family Medicine

## 2014-07-25 VITALS — BP 110/80 | Temp 97.9°F | Wt 204.0 lb

## 2014-07-25 DIAGNOSIS — T7840XA Allergy, unspecified, initial encounter: Secondary | ICD-10-CM

## 2014-07-25 NOTE — Telephone Encounter (Signed)
Patient Information:  Caller Name: Jatavis  Phone: [phone]  Patient: [name]  Gender: Male  DOB: 31-Aug-1961  Age: 53 Years  PCP: Stevie Kern [facility])  Office Follow Up:  Does the office need to follow up with this patient?: No  Instructions For The Office: N/A  RN Note:  Stung on Saturday, fine on Sunday through out the day with some swelling Sunday evening,and today it is swollen shut. Upper eyelid is swollen, and corner of eye next to nose is swollen from bee sting to eyebrow and rt temporal area. Improvement in itching with Benadryl, no improvement noted with swelling.  Is using eyepack to assist with swelling.  No appointments available, Spoke with office staff and appointment scheduled for patient to be seen at 14:30 for evaluation by Dr. Sherren Mocha.  Patient made aware.  Also noted to staff that patient has another appointment to be at at 4:00 and will need to be out of the office at 15:30 in order to arrive on time.  Symptoms  Reason For Call & Symptoms: Stung by yellow jackets to right forearm, right eyebrow, and right temporal  Reviewed Health History In EMR: Yes  Reviewed Medications In EMR: Yes  Reviewed Allergies In EMR: Yes  Reviewed Surgeries / Procedures: Yes  Date of Onset of Symptoms: 07/22/2014  Treatments Tried: Beandryl 50 mg last taken at 0400 07/25/14  Treatments Tried Worked: No  Guideline(s) Used:  Clinical biochemist  Disposition Per Guideline:   See Today in Office  Reason For Disposition Reached:   Red or very tender (to touch) area, and started over 24 hours after the sting  Advice Given:  Apply Cold to the Area for Pain - Cold Pack Method:  Wrap a bag of ice in a towel (or use a bag of frozen vegetables such as peas).  Apply this cold pack to the area of the sting for 10-20 minutes.  Hydrocortisone Cream for Itching:  Hydrocortisone cream applied to the sting area 4 times a day can also help reduce itching. Use it for a couple days until the  itch is mild.  Call Back If:  You become worse.  Patient Will Follow Care Advice:  YES  Appointment Scheduled:  07/25/2014 14:30:00 Appointment Scheduled Provider:  Stevie Kern [facility])

## 2014-07-25 NOTE — Progress Notes (Signed)
   Subjective:    Patient ID: [name], male    DOB: 07/23/1961, 53 y.o.   MRN: [id number]  HPI [name] is a 53 year old male who comes in today for evaluation of allergic reaction from bee stings  On Saturday he gets on the right side of his face by yellow jackets x3. Did maintain usual that day her Sunday Monday woke up and his right eye was swollen. Since that time she's been taking Benadryl and using ice. He took some prednisone last week because of his severe hand eczema   Review of Systems Review of systems negative no airway problems    Objective:   Physical Exam  Well-developed well-nourished male no acute distress vital signs stable he is afebrile examination he shows other eczema of the upper and lower eyelids consistent with a local allergic reaction      Assessment & Plan:  Localized allergic reaction........... Ice.......... Benadryl each bedtime......... short course of prednisone

## 2014-07-25 NOTE — Progress Notes (Signed)
Pre visit review using our clinic review tool, if applicable. No additional management support is needed unless otherwise documented below in the visit note. 

## 2014-07-25 NOTE — Patient Instructions (Signed)
Prednisone 20 mg........Marland Kitchen 1 tab x3 days, a half a tab x3 days, then stop  Ice when necessary  1 Benadryl.......... only at bedtime ...Marland KitchenMarland KitchenMarland Kitchen

## 2014-07-25 NOTE — Telephone Encounter (Signed)
Rachel--FYI.

## 2015-03-23 ENCOUNTER — Telehealth: Payer: Self-pay | Admitting: Family Medicine

## 2015-03-23 NOTE — Telephone Encounter (Signed)
Pt need some paperwork filled out and need to have a physical before 04/15/15 . Can he be scheduled some where

## 2015-03-29 NOTE — Telephone Encounter (Signed)
Okay to schedule patient for forms only on an acute/same day

## 2015-03-29 NOTE — Telephone Encounter (Signed)
lmovm to c/b and schedule appt for forms

## 2015-04-03 ENCOUNTER — Other Ambulatory Visit (INDEPENDENT_AMBULATORY_CARE_PROVIDER_SITE_OTHER): Payer: BLUE CROSS/BLUE SHIELD

## 2015-04-03 DIAGNOSIS — Z0000 Encounter for general adult medical examination without abnormal findings: Secondary | ICD-10-CM | POA: Diagnosis not present

## 2015-04-03 LAB — POCT URINALYSIS DIPSTICK
Bilirubin, UA: NEGATIVE
Blood, UA: NEGATIVE
GLUCOSE UA: NEGATIVE
KETONES UA: NEGATIVE
Leukocytes, UA: NEGATIVE
Nitrite, UA: NEGATIVE
Protein, UA: NEGATIVE
Spec Grav, UA: 1.02
Urobilinogen, UA: 0.2
pH, UA: 5.5

## 2015-04-03 LAB — CBC WITH DIFFERENTIAL/PLATELET
Basophils Absolute: 0 10*3/uL (ref 0.0–0.1)
Basophils Relative: 0.2 % (ref 0.0–3.0)
EOS PCT: 0.1 % (ref 0.0–5.0)
Eosinophils Absolute: 0 10*3/uL (ref 0.0–0.7)
HCT: 44.5 % (ref 39.0–52.0)
HEMOGLOBIN: 15.2 g/dL (ref 13.0–17.0)
LYMPHS ABS: 2.1 10*3/uL (ref 0.7–4.0)
Lymphocytes Relative: 24.9 % (ref 12.0–46.0)
MCHC: 34.2 g/dL (ref 30.0–36.0)
MCV: 91.7 fl (ref 78.0–100.0)
MONO ABS: 0.7 10*3/uL (ref 0.1–1.0)
MONOS PCT: 8.8 % (ref 3.0–12.0)
NEUTROS ABS: 5.4 10*3/uL (ref 1.4–7.7)
Neutrophils Relative %: 66 % (ref 43.0–77.0)
Platelets: 255 10*3/uL (ref 150.0–400.0)
RBC: 4.86 Mil/uL (ref 4.22–5.81)
RDW: 14 % (ref 11.5–15.5)
WBC: 8.2 10*3/uL (ref 4.0–10.5)

## 2015-04-03 LAB — BASIC METABOLIC PANEL
BUN: 11 mg/dL (ref 6–23)
CHLORIDE: 105 meq/L (ref 96–112)
CO2: 31 meq/L (ref 19–32)
Calcium: 9.6 mg/dL (ref 8.4–10.5)
Creatinine, Ser: 0.99 mg/dL (ref 0.40–1.50)
GFR: 84.55 mL/min (ref >60.00)
Glucose, Bld: 101 mg/dL — ABNORMAL HIGH (ref 70–99)
POTASSIUM: 4.9 meq/L (ref 3.5–5.1)
SODIUM: 142 meq/L (ref 135–145)

## 2015-04-03 LAB — TSH: TSH: 0.46 u[IU]/mL (ref 0.35–4.50)

## 2015-04-03 LAB — HEPATIC FUNCTION PANEL
ALK PHOS: 80 U/L (ref 39–117)
ALT: 17 U/L (ref 0–53)
AST: 20 U/L (ref 0–37)
Albumin: 4.3 g/dL (ref 3.5–5.2)
BILIRUBIN DIRECT: 0.3 mg/dL (ref 0.0–0.3)
TOTAL PROTEIN: 6.7 g/dL (ref 6.0–8.3)
Total Bilirubin: 1.7 mg/dL — ABNORMAL HIGH (ref 0.2–1.2)

## 2015-04-03 LAB — LIPID PANEL
Cholesterol: 175 mg/dL (ref 0–200)
HDL: 54.5 mg/dL (ref >39.00)
LDL CALC: 107 mg/dL — AB (ref 0–99)
NONHDL: 120.5
Total CHOL/HDL Ratio: 3
Triglycerides: 69 mg/dL (ref 0.0–149.0)
VLDL: 13.8 mg/dL (ref 0.0–40.0)

## 2015-04-03 LAB — PSA: PSA: 0.78 ng/mL (ref 0.10–4.00)

## 2015-04-10 ENCOUNTER — Ambulatory Visit (INDEPENDENT_AMBULATORY_CARE_PROVIDER_SITE_OTHER): Payer: BLUE CROSS/BLUE SHIELD | Admitting: Family Medicine

## 2015-04-10 VITALS — BP 120/84 | Temp 98.3°F | Ht 70.5 in | Wt 204.0 lb

## 2015-04-10 DIAGNOSIS — Z0000 Encounter for general adult medical examination without abnormal findings: Secondary | ICD-10-CM

## 2015-04-10 DIAGNOSIS — L301 Dyshidrosis [pompholyx]: Secondary | ICD-10-CM

## 2015-04-10 DIAGNOSIS — Z85828 Personal history of other malignant neoplasm of skin: Secondary | ICD-10-CM | POA: Diagnosis not present

## 2015-04-10 DIAGNOSIS — F528 Other sexual dysfunction not due to a substance or known physiological condition: Secondary | ICD-10-CM | POA: Diagnosis not present

## 2015-04-10 DIAGNOSIS — K2100 Gastro-esophageal reflux disease with esophagitis, without bleeding: Secondary | ICD-10-CM

## 2015-04-10 DIAGNOSIS — H01139 Eczematous dermatitis of unspecified eye, unspecified eyelid: Secondary | ICD-10-CM

## 2015-04-10 MED ORDER — SILDENAFIL CITRATE 100 MG PO TABS: 100.0000 mg | ORAL_TABLET | Freq: Every day | ORAL | Status: DC | PRN

## 2015-04-10 MED ORDER — ESOMEPRAZOLE MAGNESIUM 20 MG PO CPDR: DELAYED_RELEASE_CAPSULE | ORAL | Status: AC

## 2015-04-10 MED ORDER — PREDNISONE 20 MG PO TABS: ORAL_TABLET | ORAL | Status: DC

## 2015-04-10 MED ORDER — HALOBETASOL PROPIONATE 0.05 % EX OINT: TOPICAL_OINTMENT | CUTANEOUS | Status: DC

## 2015-04-10 NOTE — Patient Instructions (Signed)
Continue current medications  Follow-up in one year for general physical examination sooner if any problems  Rachel's extension is [phone]  [employer].......... is our new adult Designer, jewellery

## 2015-04-10 NOTE — Progress Notes (Signed)
Pre visit review using our clinic review tool, if applicable. No additional management support is needed unless otherwise documented below in the visit note. 
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
## 2015-05-03 ENCOUNTER — Ambulatory Visit: Payer: BLUE CROSS/BLUE SHIELD | Admitting: Adult Health

## 2015-05-03 ENCOUNTER — Ambulatory Visit: Payer: BLUE CROSS/BLUE SHIELD | Admitting: Family Medicine

## 2015-05-03 ENCOUNTER — Encounter: Payer: Self-pay | Admitting: *Deleted

## 2015-05-03 ENCOUNTER — Emergency Department
Admission: EM | Admit: 2015-05-03 | Discharge: 2015-05-03 | Disposition: A | Discharge status: Home / Self Care | Payer: BLUE CROSS/BLUE SHIELD | Source: Home / Self Care | Attending: Emergency Medicine | Admitting: Emergency Medicine

## 2015-05-03 ENCOUNTER — Emergency Department (INDEPENDENT_AMBULATORY_CARE_PROVIDER_SITE_OTHER): Payer: BLUE CROSS/BLUE SHIELD

## 2015-05-03 DIAGNOSIS — M25531 Pain in right wrist: Secondary | ICD-10-CM

## 2015-05-03 DIAGNOSIS — M778 Other enthesopathies, not elsewhere classified: Secondary | ICD-10-CM

## 2015-05-03 NOTE — ED Notes (Signed)
Pt c/o 5-6 days of medial right wrist pain. H/o surgery with blots and screws.

## 2015-05-03 NOTE — ED Provider Notes (Signed)
CSN: [id number]     Arrival date & time 05/03/15  1104 History   First MD Initiated Contact with Patient 05/03/15 1215     Chief Complaint  Patient presents with  . Wrist Pain   (Consider location/radiation/quality/duration/timing/severity/associated sxs/prior Treatment) Patient is a 54 y.o. male presenting with wrist pain. The history is provided by the patient. No language interpreter was used.  Wrist Pain This is a new problem. The current episode started in the past 7 days. The problem occurs constantly. The problem has been gradually worsening. Associated symptoms include joint swelling and myalgias. Nothing aggravates the symptoms. He has tried nothing for the symptoms. The treatment provided moderate relief.    Past Medical History  Diagnosis Date  . Squamous cell carcinoma, arm     left  . Melanoma of thigh     left  . Facial fracture    Past Surgical History  Procedure Laterality Date  . Fractured jaw Bilateral   . Splenectomy, total    . Cholecystectomy    . Right arm  09/14/08    x2  . Lymph removal      groin  . Mandible surgery    . Orif orbital fracture    . Zygomtic    . Nasal sinus surgery    . Palate surgery     History reviewed. No pertinent family history. History  Substance Use Topics  . Smoking status: Former Smoker -- 1.00 packs/day    Quit date: 12/07/2011  . Smokeless tobacco: Not on file  . Alcohol Use: No    Review of Systems  Musculoskeletal: Positive for myalgias and joint swelling.  All other systems reviewed and are negative.   Allergies  Cephalexin; Other; Latex; and Tape  Home Medications   Prior to Admission medications   Medication Sig Start Date End Date Taking? Authorizing Provider  albuterol (PROVENTIL HFA;VENTOLIN HFA) 108 (90 BASE) MCG/ACT inhaler 1 puff 15 minutes prior to exercise Patient not taking: Reported on 04/10/2015 03/29/13   Dorena Cookey, MD  cetirizine (ZYRTEC) 10 MG tablet Take 10 mg by mouth daily.       Historical Provider, MD  esomeprazole (NEXIUM) 20 MG capsule TAKE ONE CAPSULE BY MOUTH EVERY DAY 04/10/15   Dorena Cookey, MD  flintstones complete (FLINTSTONES) 60 MG chewable tablet Chew 1 tablet by mouth daily.    Historical Provider, MD  halobetasol (ULTRAVATE) 0.05 % ointment APPLY TO AFFECTED AREA TWICE A DAY 04/10/15   Dorena Cookey, MD  predniSONE (DELTASONE) 20 MG tablet ONE HALF TABLET Laurine Blazer, FRIDAY 04/10/15   Dorena Cookey, MD  sildenafil (VIAGRA) 100 MG tablet Take 1 tablet (100 mg total) by mouth daily as needed. 04/10/15   Dorena Cookey, MD   BP 123/75 mmHg  Pulse 65  Resp 14  Ht 5' 10.5" (1.791 m)  Wt 206 lb (93.441 kg)  BMI 29.13 kg/m2  SpO2 97% Physical Exam  Constitutional: He is oriented to person, place, and time. He appears well-developed and well-nourished.  Musculoskeletal: He exhibits tenderness.  Tender approx 2 inches above wrist at incision line nv and ns intact  Neurological: He is alert and oriented to person, place, and time. He has normal reflexes.  Skin: Skin is warm.  Psychiatric: He has a normal mood and affect.  Nursing note and vitals reviewed. Pt is worried about his screws backing out.  Pt had surgery by an Orthopaedist in [place].    ED Course  Procedures (including  critical care time) Labs Review Labs Reviewed - No data to display  Imaging Review No results found.   MDM  Pt wants xrays with bone stressed like wood to see if screws are stripped.   (I advised pt that he needs to see his Orthopaedist for recheck of his concerns.  Screws and plate appear flush with the bone)   1. Tendonitis of wrist, right    Wear splint Ibuprofen AVS    Fransico Meadow, PA-C 05/04/15 1039

## 2015-05-03 NOTE — Discharge Instructions (Signed)
Wrist Sprain with Rehab A sprain is an injury in which a ligament that maintains the proper alignment of a joint is partially or completely torn. The ligaments of the wrist are susceptible to sprains. Sprains are classified into three categories. Grade 1 sprains cause pain, but the tendon is not lengthened. Grade 2 sprains include a lengthened ligament because the ligament is stretched or partially ruptured. With grade 2 sprains there is still function, although the function may be diminished. Grade 3 sprains are characterized by a complete tear of the tendon or muscle, and function is usually impaired. SYMPTOMS   Pain tenderness, inflammation, and/or bruising (contusion) of the injury.  A "pop" or tear felt and/or heard at the time of injury.  Decreased wrist function. CAUSES  A wrist sprain occurs when a force is placed on one or more ligaments that is greater than it/they can withstand. Common mechanisms of injury include:  Catching a ball with you hands.  Repetitive and/ or strenuous extension or flexion of the wrist. RISK INCREASES WITH:  Previous wrist injury.  Contact sports (boxing or wrestling).  Activities in which falling is common.  Poor strength and flexibility.  Improperly fitted or padded protective equipment. PREVENTION  Warm up and stretch properly before activity.  Allow for adequate recovery between workouts.  Maintain physical fitness:  Strength, flexibility, and endurance.  Cardiovascular fitness.  Protect the wrist joint by limiting its motion with the use of taping, braces, or splints.  Protect the wrist after injury for 6 to 12 months. PROGNOSIS  The prognosis for wrist sprains depends on the degree of injury. Grade 1 sprains require 2 to 6 weeks of treatment. Grade 2 sprains require 6 to 8 weeks of treatment, and grade 3 sprains require up to 12 weeks.  RELATED COMPLICATIONS   Prolonged healing time, if improperly treated or  re-injured.  Recurrent symptoms that result in a chronic problem.  Injury to nearby structures (bone, cartilage, nerves, or tendons).  Arthritis of the wrist.  Inability to compete in athletics at a high level.  Wrist stiffness or weakness.  Progression to a complete rupture of the ligament. TREATMENT  Treatment initially involves resting from any activities that aggravate the symptoms, and the use of ice and medications to help reduce pain and inflammation. Your caregiver may recommend immobilizing the wrist for a period of time in order to reduce stress on the ligament and allow for healing. After immobilization it is important to perform strengthening and stretching exercises to help regain strength and a full range of motion. These exercises may be completed at home or with a therapist. Surgery is not usually required for wrist sprains, unless the ligament has been ruptured (grade 3 sprain). MEDICATION   If pain medication is necessary, then nonsteroidal anti-inflammatory medications, such as aspirin and ibuprofen, or other minor pain relievers, such as acetaminophen, are often recommended.  Do not take pain medication for 7 days before surgery.  Prescription pain relievers may be given if deemed necessary by your caregiver. Use only as directed and only as much as you need. HEAT AND COLD  Cold treatment (icing) relieves pain and reduces inflammation. Cold treatment should be applied for 10 to 15 minutes every 2 to 3 hours for inflammation and pain and immediately after any activity that aggravates your symptoms. Use ice packs or massage the area with a piece of ice (ice massage).  Heat treatment may be used prior to performing the stretching and strengthening activities prescribed by your  caregiver, physical therapist, or athletic trainer. Use a heat pack or soak your injury in warm water. SEEK MEDICAL CARE IF:  Treatment seems to offer no benefit, or the condition worsens.  Any  medications produce adverse side effects. EXERCISES RANGE OF MOTION (ROM) AND STRETCHING EXERCISES - Wrist Sprain  These exercises may help you when beginning to rehabilitate your injury. Your symptoms may resolve with or without further involvement from your physician, physical therapist or athletic trainer. While completing these exercises, remember:   Restoring tissue flexibility helps normal motion to return to the joints. This allows healthier, less painful movement and activity.  An effective stretch should be held for at least 30 seconds.  A stretch should never be painful. You should only feel a gentle lengthening or release in the stretched tissue. RANGE OF MOTION - Wrist Flexion, Active-Assisted  Extend your right / left elbow with your fingers pointing down.*  Gently pull the back of your hand towards you until you feel a gentle stretch on the top of your forearm.  Hold this position for __________ seconds. Repeat __________ times. Complete this exercise __________ times per day.  *If directed by your physician, physical therapist or athletic trainer, complete this stretch with your elbow bent rather than extended. RANGE OF MOTION - Wrist Extension, Active-Assisted  Extend your right / left elbow and turn your palm upwards.*  Gently pull your palm/fingertips back so your wrist extends and your fingers point more toward the ground.  You should feel a gentle stretch on the inside of your forearm.  Hold this position for __________ seconds. Repeat __________ times. Complete this exercise __________ times per day. *If directed by your physician, physical therapist or athletic trainer, complete this stretch with your elbow bent, rather than extended. RANGE OF MOTION - Supination, Active  Stand or sit with your elbows at your side. Bend your right / left elbow to 90 degrees.  Turn your palm upward until you feel a gentle stretch on the inside of your forearm.  Hold this  position for __________ seconds. Slowly release and return to the starting position. Repeat __________ times. Complete this stretch __________ times per day.  RANGE OF MOTION - Pronation, Active  Stand or sit with your elbows at your side. Bend your right / left elbow to 90 degrees.  Turn your palm downward until you feel a gentle stretch on the top of your forearm.  Hold this position for __________ seconds. Slowly release and return to the starting position. Repeat __________ times. Complete this stretch __________ times per day.  STRETCH - Wrist Flexion  Place the back of your right / left hand on a tabletop leaving your elbow slightly bent. Your fingers should point away from your body.  Gently press the back of your hand down onto the table by straightening your elbow. You should feel a stretch on the top of your forearm.  Hold this position for __________ seconds. Repeat __________ times. Complete this stretch __________ times per day.  STRETCH - Wrist Extension  Place your right / left fingertips on a tabletop leaving your elbow slightly bent. Your fingers should point backwards.  Gently press your fingers and palm down onto the table by straightening your elbow. You should feel a stretch on the inside of your forearm.  Hold this position for __________ seconds. Repeat __________ times. Complete this stretch __________ times per day.  STRENGTHENING EXERCISES - Wrist Sprain These exercises may help you when beginning to rehabilitate your injury.  They may resolve your symptoms with or without further involvement from your physician, physical therapist or athletic trainer. While completing these exercises, remember:   Muscles can gain both the endurance and the strength needed for everyday activities through controlled exercises.  Complete these exercises as instructed by your physician, physical therapist or athletic trainer. Progress with the resistance and repetition exercises  only as your caregiver advises. STRENGTH - Wrist Flexors  Sit with your right / left forearm palm-up and fully supported. Your elbow should be resting below the height of your shoulder. Allow your wrist to extend over the edge of the surface.  Loosely holding a __________ weight or a piece of rubber exercise band/tubing, slowly curl your hand up toward your forearm.  Hold this position for __________ seconds. Slowly lower the wrist back to the starting position in a controlled manner. Repeat __________ times. Complete this exercise __________ times per day.  STRENGTH - Wrist Extensors  Sit with your right / left forearm palm-down and fully supported. Your elbow should be resting below the height of your shoulder. Allow your wrist to extend over the edge of the surface.  Loosely holding a __________ weight or a piece of rubber exercise band/tubing, slowly curl your hand up toward your forearm.  Hold this position for __________ seconds. Slowly lower the wrist back to the starting position in a controlled manner. Repeat __________ times. Complete this exercise __________ times per day.  STRENGTH - Ulnar Deviators  Stand with a ____________________ weight in your right / left hand, or sit holding on to the rubber exercise band/tubing with your opposite arm supported.  Move your wrist so that your pinkie travels toward your forearm and your thumb moves away from your forearm.  Hold this position for __________ seconds and then slowly lower the wrist back to the starting position. Repeat __________ times. Complete this exercise __________ times per day STRENGTH - Radial Deviators  Stand with a ____________________ weight in your  right / left hand, or sit holding on to the rubber exercise band/tubing with your arm supported.  Raise your hand upward in front of you or pull up on the rubber tubing.  Hold this position for __________ seconds and then slowly lower the wrist back to the  starting position. Repeat __________ times. Complete this exercise __________ times per day. STRENGTH - Forearm Supinators  Sit with your right / left forearm supported on a table, keeping your elbow below shoulder height. Rest your hand over the edge, palm down.  Gently grip a hammer or a soup ladle.  Without moving your elbow, slowly turn your palm and hand upward to a "thumbs-up" position.  Hold this position for __________ seconds. Slowly return to the starting position. Repeat __________ times. Complete this exercise __________ times per day.  STRENGTH - Forearm Pronators  Sit with your right / left forearm supported on a table, keeping your elbow below shoulder height. Rest your hand over the edge, palm up.  Gently grip a hammer or a soup ladle.  Without moving your elbow, slowly turn your palm and hand upward to a "thumbs-up" position.  Hold this position for __________ seconds. Slowly return to the starting position. Repeat __________ times. Complete this exercise __________ times per day.  STRENGTH - Grip  Grasp a tennis ball, a dense sponge, or a large, rolled sock in your hand.  Squeeze as hard as you can without increasing any pain.  Hold this position for __________ seconds. Release your grip slowly.  Repeat __________ times. Complete this exercise __________ times per day.  Document Released: 12/01/2005 Document Revised: 02/23/2012 Document Reviewed: 03/15/2009 [facility] Patient Information 2015 [place], [place]. This information is not intended to replace advice given to you by your health care provider. Make sure you discuss any questions you have with your health care provider.

## 2015-07-15 ENCOUNTER — Other Ambulatory Visit: Payer: Self-pay | Admitting: Family Medicine

## 2016-04-09 ENCOUNTER — Telehealth: Payer: Self-pay | Admitting: Family Medicine

## 2016-04-09 ENCOUNTER — Encounter: Payer: Self-pay | Admitting: Family Medicine

## 2016-04-09 ENCOUNTER — Ambulatory Visit (INDEPENDENT_AMBULATORY_CARE_PROVIDER_SITE_OTHER): Payer: BLUE CROSS/BLUE SHIELD | Admitting: Family Medicine

## 2016-04-09 VITALS — BP 112/76 | HR 63 | Temp 98.3°F | Resp 20 | Ht 71.0 in | Wt 191.8 lb

## 2016-04-09 DIAGNOSIS — Z23 Encounter for immunization: Secondary | ICD-10-CM

## 2016-04-09 DIAGNOSIS — K2100 Gastro-esophageal reflux disease with esophagitis, without bleeding: Secondary | ICD-10-CM

## 2016-04-09 DIAGNOSIS — Z9081 Acquired absence of spleen: Secondary | ICD-10-CM | POA: Insufficient documentation

## 2016-04-09 DIAGNOSIS — C44629 Squamous cell carcinoma of skin of left upper limb, including shoulder: Secondary | ICD-10-CM

## 2016-04-09 DIAGNOSIS — F528 Other sexual dysfunction not due to a substance or known physiological condition: Secondary | ICD-10-CM

## 2016-04-09 DIAGNOSIS — Z13220 Encounter for screening for lipoid disorders: Secondary | ICD-10-CM

## 2016-04-09 DIAGNOSIS — H01139 Eczematous dermatitis of unspecified eye, unspecified eyelid: Secondary | ICD-10-CM | POA: Diagnosis not present

## 2016-04-09 DIAGNOSIS — E663 Overweight: Secondary | ICD-10-CM | POA: Insufficient documentation

## 2016-04-09 DIAGNOSIS — Z1329 Encounter for screening for other suspected endocrine disorder: Secondary | ICD-10-CM

## 2016-04-09 DIAGNOSIS — Z130 Encounter for screening for diseases of the blood and blood-forming organs and certain disorders involving the immune mechanism: Secondary | ICD-10-CM

## 2016-04-09 DIAGNOSIS — Z0000 Encounter for general adult medical examination without abnormal findings: Secondary | ICD-10-CM | POA: Diagnosis not present

## 2016-04-09 DIAGNOSIS — C439 Malignant melanoma of skin, unspecified: Secondary | ICD-10-CM | POA: Insufficient documentation

## 2016-04-09 DIAGNOSIS — C4372 Malignant melanoma of left lower limb, including hip: Secondary | ICD-10-CM

## 2016-04-09 DIAGNOSIS — Z6826 Body mass index (BMI) 26.0-26.9, adult: Secondary | ICD-10-CM

## 2016-04-09 DIAGNOSIS — Z1211 Encounter for screening for malignant neoplasm of colon: Secondary | ICD-10-CM

## 2016-04-09 LAB — CBC WITH DIFFERENTIAL/PLATELET
Basophils Absolute: 0 10*3/uL (ref 0.0–0.1)
Basophils Relative: 0.4 % (ref 0.0–3.0)
Eosinophils Absolute: 0.2 10*3/uL (ref 0.0–0.7)
Eosinophils Relative: 2.5 % (ref 0.0–5.0)
HCT: 45.9 % (ref 39.0–52.0)
HEMOGLOBIN: 15.6 g/dL (ref 13.0–17.0)
LYMPHS ABS: 1.8 10*3/uL (ref 0.7–4.0)
Lymphocytes Relative: 23.3 % (ref 12.0–46.0)
MCHC: 33.9 g/dL (ref 30.0–36.0)
MCV: 93 fl (ref 78.0–100.0)
MONO ABS: 0.8 10*3/uL (ref 0.1–1.0)
MONOS PCT: 11 % (ref 3.0–12.0)
NEUTROS ABS: 4.8 10*3/uL (ref 1.4–7.7)
Neutrophils Relative %: 62.8 % (ref 43.0–77.0)
Platelets: 293 10*3/uL (ref 150.0–400.0)
RBC: 4.94 Mil/uL (ref 4.22–5.81)
RDW: 14.2 % (ref 11.5–15.5)
WBC: 7.7 10*3/uL (ref 4.0–10.5)

## 2016-04-09 LAB — COMPREHENSIVE METABOLIC PANEL
ALBUMIN: 4.3 g/dL (ref 3.5–5.2)
ALT: 20 U/L (ref 0–53)
AST: 19 U/L (ref 0–37)
Alkaline Phosphatase: 76 U/L (ref 39–117)
BILIRUBIN TOTAL: 1.1 mg/dL (ref 0.2–1.2)
BUN: 12 mg/dL (ref 6–23)
CHLORIDE: 103 meq/L (ref 96–112)
CO2: 32 meq/L (ref 19–32)
CREATININE: 0.85 mg/dL (ref 0.40–1.50)
Calcium: 9.3 mg/dL (ref 8.4–10.5)
GFR: 100.42 mL/min (ref >60.00)
Glucose, Bld: 91 mg/dL (ref 70–99)
Potassium: 4.5 mEq/L (ref 3.5–5.1)
SODIUM: 142 meq/L (ref 135–145)
Total Protein: 6.9 g/dL (ref 6.0–8.3)

## 2016-04-09 LAB — LIPID PANEL
CHOL/HDL RATIO: 3
Cholesterol: 178 mg/dL (ref 0–200)
HDL: 64.7 mg/dL (ref >39.00)
LDL CALC: 97 mg/dL (ref 0–99)
NonHDL: 113.69
Triglycerides: 82 mg/dL (ref 0.0–149.0)
VLDL: 16.4 mg/dL (ref 0.0–40.0)

## 2016-04-09 LAB — TSH: TSH: 0.56 u[IU]/mL (ref 0.35–4.50)

## 2016-04-09 MED ORDER — SILDENAFIL CITRATE 100 MG PO TABS: 100.0000 mg | ORAL_TABLET | Freq: Every day | ORAL | Status: DC | PRN

## 2016-04-09 MED ORDER — HALOBETASOL PROPIONATE 0.05 % EX OINT: TOPICAL_OINTMENT | CUTANEOUS | Status: DC

## 2016-04-09 MED ORDER — PREDNISONE 20 MG PO TABS: ORAL_TABLET | ORAL | Status: DC

## 2016-04-09 NOTE — Patient Instructions (Addendum)

## 2016-04-09 NOTE — Telephone Encounter (Signed)
Please call pt: all his labs are normal.  Results for orders placed or performed in visit on 04/09/16 (from the past 72 hour(s))  CBC w/Diff     Status: None   Collection Time: 04/09/16  9:54 AM  Result Value Ref Range   WBC 7.7 4.0 - 10.5 K/uL   RBC 4.94 4.22 - 5.81 Mil/uL   Hemoglobin 15.6 13.0 - 17.0 g/dL   HCT 45.9 39.0 - 52.0 %   MCV 93.0 78.0 - 100.0 fl   MCHC 33.9 30.0 - 36.0 g/dL   RDW 14.2 11.5 - 15.5 %   Platelets 293.0 150.0 - 400.0 K/uL   Neutrophils Relative % 62.8 43.0 - 77.0 %   Lymphocytes Relative 23.3 12.0 - 46.0 %   Monocytes Relative 11.0 3.0 - 12.0 %   Eosinophils Relative 2.5 0.0 - 5.0 %   Basophils Relative 0.4 0.0 - 3.0 %   Neutro Abs 4.8 1.4 - 7.7 K/uL   Lymphs Abs 1.8 0.7 - 4.0 K/uL   Monocytes Absolute 0.8 0.1 - 1.0 K/uL   Eosinophils Absolute 0.2 0.0 - 0.7 K/uL   Basophils Absolute 0.0 0.0 - 0.1 K/uL  Comprehensive metabolic panel     Status: None   Collection Time: 04/09/16  9:54 AM  Result Value Ref Range   Sodium 142 135 - 145 mEq/L   Potassium 4.5 3.5 - 5.1 mEq/L   Chloride 103 96 - 112 mEq/L   CO2 32 19 - 32 mEq/L   Glucose, Bld 91 70 - 99 mg/dL   BUN 12 6 - 23 mg/dL   Creatinine, Ser 0.85 0.40 - 1.50 mg/dL   Total Bilirubin 1.1 0.2 - 1.2 mg/dL   Alkaline Phosphatase 76 39 - 117 U/L   AST 19 0 - 37 U/L   ALT 20 0 - 53 U/L   Total Protein 6.9 6.0 - 8.3 g/dL   Albumin 4.3 3.5 - 5.2 g/dL   Calcium 9.3 8.4 - 10.5 mg/dL   GFR 100.42 >60.00 mL/min  Lipid panel     Status: None   Collection Time: 04/09/16  9:54 AM  Result Value Ref Range   Cholesterol 178 0 - 200 mg/dL    Comment: ATP III Classification       Desirable:  < 200 mg/dL               Borderline High:  200 - 239 mg/dL          High:  > = 240 mg/dL   Triglycerides 82.0 0.0 - 149.0 mg/dL    Comment: Normal:  <150 mg/dLBorderline High:  150 - 199 mg/dL   HDL 64.70 >39.00 mg/dL   VLDL 16.4 0.0 - 40.0 mg/dL   LDL Cholesterol 97 0 - 99 mg/dL   Total CHOL/HDL Ratio 3     Comment:                 Men          Women1/2 Average Risk     3.4          3.3Average Risk          5.0          4.42X Average Risk          9.6          7.13X Average Risk          15.0          11.0  NonHDL 113.69     Comment: NOTE:  Non-HDL goal should be 30 mg/dL higher than patient's LDL goal (i.e. LDL goal of < 70 mg/dL, would have non-HDL goal of < 100 mg/dL)  TSH     Status: None   Collection Time: 04/09/16  9:54 AM  Result Value Ref Range   TSH 0.56 0.35 - 4.50 uIU/mL

## 2016-04-09 NOTE — Progress Notes (Signed)
Patient ID: [name], male   DOB: 1961/10/03, 55 y.o.   MRN: [id number]      Patient ID: [name], male  DOB: Feb 07, 1961, 55 y.o.   MRN: [id number]  Subjective:  [name] is a 55 y.o. male present for establishment of care with need of CPE. All past medical history, surgical history, allergies, family history, immunizations, medications and social history were obtained and updated  in the electronic medical record today. All recent labs, ED visits and hospitalizations within the last year were reviewed.  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 PSA: (0.78) 04/03/2015. Pt declines screening this year after discussion. Offer yearly.  Assistive device: None Oxygen use: None  Patient has a Dental home. Hospitalizations/ED visits: reviewed visit 05/03/2015   Immunization History  Administered Date(s) Administered  . Hep A / Hep B 03/22/2012, 04/19/2012, 09/13/2012  . Influenza Split 06/08/2012  . Influenza Whole 09/01/2012  . Meningococcal Polysaccharide 10/23/2009  . Pneumococcal Polysaccharide-23 03/18/2011, 04/09/2016  . Td 05/18/2007     Melanoma: pt has a history of melanoma with sentinel node biopsy and possible interferon??? In 1999. Of note, he also has had squamous and basal cell skin cancers removed. He has not had a follow up with dermatology in over 2 years. He has never had an ophthalmology exam. Her dermatologist at that time were Dr. Leilani Merl 1999- 2000 Dr. Layla Barter (surgeon). He also say Dr. Beather Arbour?, which was an melanoma specialist at that time at [facility].   Past Medical History  Diagnosis Date  . Squamous cell carcinoma, arm     left  . Melanoma of thigh ([place])     left  . Facial  fracture ([place])   . Allergy   . Arthritis   . History of chicken pox   . Basal cell carcinoma   . Erectile dysfunction   . GERD (gastroesophageal reflux disease)    Allergies  Allergen Reactions  . Cephalexin Other (See Comments)    "Sore throat" REACTION: SORE THROAT  . Other     Bee sting  . Latex Rash  . Tape Rash   Past Surgical History  Procedure Laterality Date  . 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 Cholecystectomy  2009  . Right arm  09/14/08    x2 plates/screws  . Mandible surgery    . Orif orbital fracture    . Zygomtic Left 2015    fracture/plate-screw  . Nasal sinus surgery    . Palate surgery    . Melanoma excision with sentinel lymph node biopsy  1999   Family History  Problem Relation Age of Onset  . Arthritis Mother   . Arthritis Father   . Hearing loss Father   . Pancreatic cancer Maternal Grandfather   . Pancreatic cancer Paternal Grandfather    Social History   Social History  . Marital Status: Divorced    Spouse Name: N/A  . Number of Children: N/A  . Years of Education: N/A   Occupational History  . Not on file.   Social History Main Topics  . Smoking status: Former Smoker -- 1.00 packs/day    Quit date: 12/07/2011  . Smokeless tobacco: Never Used  . Alcohol Use: Yes  . Drug Use:  No  . Sexual Activity: Yes    Birth Control/ Protection: Condom   Other Topics Concern  . Not on file   Social History Narrative   Divorced, 1 son (Rylan).   2 years of college.   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    Former smoker (quit 12/07/2011), no drug use, 1-2 drinks a week.   Drinks caffeine, takes a daily vitamin   Wears his seatbelt, smoke detector in the home, firearms in the home.    Exercises > 3x a day   Feels safe in relationships     Medication List       This list is accurate as of: 04/09/16  1:43 PM.  Always use your most recent med list.               albuterol 108 (90 Base) MCG/ACT inhaler  Commonly  known as:  PROVENTIL HFA;VENTOLIN HFA  1 puff 15 minutes prior to exercise     cetirizine 10 MG tablet  Commonly known as:  ZYRTEC  Take 10 mg by mouth daily.     esomeprazole 20 MG capsule  Commonly known as:  NEXIUM  TAKE ONE CAPSULE BY MOUTH EVERY DAY     flintstones complete 60 MG chewable tablet  Chew 1 tablet by mouth daily.     halobetasol 0.05 % ointment  Commonly known as:  ULTRAVATE  APPLY TO AFFECTED AREA TWICE A DAY     predniSONE 20 MG tablet  Commonly known as:  DELTASONE  TAKE 1/2 TABLET ON MONDAY, WEDNESDAY, AND FRIDAY     sildenafil 100 MG tablet  Commonly known as:  VIAGRA  Take 1 tablet (100 mg total) by mouth daily as needed.        ROS: Negative, with the exception of above mentioned in HPI  Objective: BP 112/76 mmHg  Pulse 63  Temp(Src) 98.3 F (36.8 C) (Oral)  Resp 20  Ht 5\' 11"  (1.803 m)  Wt 191 lb 12 oz (86.977 kg)  BMI 26.76 kg/m2  SpO2 98% Gen: Afebrile. No acute distress. Nontoxic in appearance, well-developed, well-nourished, male, very pleasnt male.  HENT: AT. [place]. Bilateral TM visualized and normal in appearance, normal external auditory canal. MMM, no oral lesions, Good dentition. Bilateral nares without erythema or swelling. Throat without  erythema, ulcerations or exudates. No Cough on exam, No hoarseness on exam. Eyes:Pupils Equal Round Reactive to light, Extraocular movements intact,  Conjunctiva without redness, discharge or icterus. Neck/lymp/endocrine: Supple,No lymphadenopathy, No thyromegaly CV: RRR no murmur appreciated, no edema, +2/4 P posterior tibialis pulses. no carotid bruits. No JVD. Chest: CTAB, no wheeze, rhonchi or crackles. Normal Respiratory effort. Good Air movement. Abd: Soft. flat. NTND. BS present. No  Masses palpated. No hepatosplenomegaly. No rebound tenderness or guarding. Skin: no rashes, purpura or petechiae. Warm and well-perfused. Skin intact. Neuro/Msk: Normal gait. PERLA. EOMi. Alert. Oriented x3.   Cranial nerves II through XII intact. Muscle strength 5/5 UE/LE extremity. DTRs equal bilaterally. Psych: Normal affect, dress and demeanor. Normal speech. Normal thought content and judgment.  Assessment/plan: [name] is a 55 y.o. male present for annual exam and establish with a new doctor (within [place]). Encounter for Preventive health care examination/Blood tests for routine general physical examination:  Body mass index is 26.76 kg/(m^2). Patient was encouraged to exercise greater than 150 minutes a week. Patient was encouraged to choose a diet filled with fresh fruits and vegetables, and lean meats. AVS provided to patient today for  education/recommendation on gender specific health and safety maintenance. - CBC w/Diff - Comprehensive metabolic panel - Lipid panel - TSH - Ambulatory referral to Dermatology - halobetasol (ULTRAVATE) 0.05 % ointment; APPLY TO AFFECTED AREA TWICE A DAY  Dispense: 50 g; Refill: 3 - sildenafil (VIAGRA) 100 MG tablet; Take 1 tablet (100 mg total) by mouth daily as needed.  Dispense: 10 tablet; Refill: 6 - predniSONE (DELTASONE) 20 MG tablet; TAKE 1/2 TABLET ON MONDAY, WEDNESDAY, AND FRIDAY  Dispense: 50 tablet; Refill: 1 - Ambulatory referral to Gastroenterology - Pneumococcal polysaccharide vaccine 23-valent greater than or equal to 2yo subcutaneous/IM  Lipid screening/Screening cholesterol level - Lipid panel  Screening for thyroid disorder - TSH  Eczematous dermatitis of eyelid, unspecified laterality - halobetasol (ULTRAVATE) 0.05 % ointment; APPLY TO AFFECTED AREA TWICE A DAY  Dispense: 50 g; Refill: 3 - predniSONE (DELTASONE) 20 MG tablet; TAKE 1/2 TABLET ON MONDAY, WEDNESDAY, AND FRIDAY  Dispense: 50 tablet; Refill: 1  ERECTILE DYSFUNCTION, MILD - sildenafil (VIAGRA) 100 MG tablet; Take 1 tablet (100 mg total) by mouth daily as needed.  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  - requested all records pertaining to his skin cancers: melanoma-sentinel node/interferon?, squamous cell, basal cell.   Screening for deficiency anemia - CBC w/Diff  H/O splenectomy - Pneumococcal polysaccharide vaccine 23-valent greater than or equal to 2yo subcutaneous/IM  ESOPHAGITIS, REFLUX - Continue nexium PRN    Return in about 1 year (around 04/09/2017) for CPE. Unless labs warrant closer follow up.   Electronically signed by: Howard Pouch, DO [place]

## 2016-04-10 ENCOUNTER — Telehealth: Payer: Self-pay | Admitting: Family Medicine

## 2016-04-10 ENCOUNTER — Other Ambulatory Visit: Payer: Self-pay | Admitting: Family Medicine

## 2016-04-10 DIAGNOSIS — Z9081 Acquired absence of spleen: Secondary | ICD-10-CM

## 2016-04-10 DIAGNOSIS — Z13220 Encounter for screening for lipoid disorders: Secondary | ICD-10-CM

## 2016-04-10 DIAGNOSIS — Z1329 Encounter for screening for other suspected endocrine disorder: Secondary | ICD-10-CM

## 2016-04-10 DIAGNOSIS — Z6826 Body mass index (BMI) 26.0-26.9, adult: Secondary | ICD-10-CM

## 2016-04-10 DIAGNOSIS — Z130 Encounter for screening for diseases of the blood and blood-forming organs and certain disorders involving the immune mechanism: Secondary | ICD-10-CM

## 2016-04-10 DIAGNOSIS — Z125 Encounter for screening for malignant neoplasm of prostate: Secondary | ICD-10-CM | POA: Insufficient documentation

## 2016-04-10 DIAGNOSIS — Z0000 Encounter for general adult medical examination without abnormal findings: Secondary | ICD-10-CM

## 2016-04-10 DIAGNOSIS — C4372 Malignant melanoma of left lower limb, including hip: Secondary | ICD-10-CM

## 2016-04-10 NOTE — Telephone Encounter (Signed)
Left message for patient to return call.

## 2016-04-10 NOTE — Telephone Encounter (Signed)
Please call pt: - I have placed future orders in for his physical next year. Please have him schedule a lab appt a few days prior to his OV for this, so we will have all results. - There are two other issues I need to discuss:   1. CDC recommends we offer HIV and Hep C screening once in a life time. He has not had these completed. If he would like to be tested I can add this to next years labs as well, but needed to ask prior to collection.     - yes, he had immunizations to A and C... No immunization for C    - Some insurance do charge a small fee, most cover completely 2. In review of records his meningococcal was completed in 2010. 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 He should nake a nurse appt to have this vaccination completed at his earliest convenience.    - Just FYI: It is also recommended he have the PNA vaccination (series) every 5 years. We did update one of these yesterday for him. He will still need the second (prevnar) next year. And his flu shot yearly.   If he desires the HIV/Hep C screen, please ordered under HIV and HEP C screening dx code;

## 2016-04-10 NOTE — Telephone Encounter (Signed)
-----   Message from [name] sent at 04/09/2016 11:12 AM EDT ----- Can patient come in the week before his physical in 2018 and have his blood drawn so that he can talk to you about his results at his appt? Thanks

## 2016-04-10 NOTE — Telephone Encounter (Signed)
Spoke with patient reviewed lab results. 

## 2016-04-15 NOTE — Telephone Encounter (Signed)
Spoke with patient he declines testing for HIV/Hep C. He states he will call next week to set up Nurse visit for meningococcal vaccine. He will call prior to his CPE appt to schedule lab visit.

## 2016-07-30 ENCOUNTER — Encounter: Payer: Self-pay | Admitting: Family Medicine

## 2016-08-19 ENCOUNTER — Encounter: Payer: Self-pay | Admitting: Family Medicine

## 2017-04-09 ENCOUNTER — Encounter: Payer: Self-pay | Admitting: Family Medicine

## 2017-04-09 ENCOUNTER — Encounter: Payer: BLUE CROSS/BLUE SHIELD | Admitting: Family Medicine

## 2017-04-09 DIAGNOSIS — Q8901 Asplenia (congenital): Secondary | ICD-10-CM | POA: Insufficient documentation

## 2017-04-17 ENCOUNTER — Ambulatory Visit (INDEPENDENT_AMBULATORY_CARE_PROVIDER_SITE_OTHER): Payer: 59 | Admitting: Family Medicine

## 2017-04-17 ENCOUNTER — Encounter: Payer: Self-pay | Admitting: Family Medicine

## 2017-04-17 VITALS — BP 112/76 | HR 56 | Temp 98.1°F | Resp 20 | Ht 71.0 in | Wt 207.0 lb

## 2017-04-17 DIAGNOSIS — Z6828 Body mass index (BMI) 28.0-28.9, adult: Secondary | ICD-10-CM | POA: Diagnosis not present

## 2017-04-17 DIAGNOSIS — Z1211 Encounter for screening for malignant neoplasm of colon: Secondary | ICD-10-CM | POA: Diagnosis not present

## 2017-04-17 DIAGNOSIS — K2100 Gastro-esophageal reflux disease with esophagitis, without bleeding: Secondary | ICD-10-CM

## 2017-04-17 DIAGNOSIS — Z13220 Encounter for screening for lipoid disorders: Secondary | ICD-10-CM

## 2017-04-17 DIAGNOSIS — Z23 Encounter for immunization: Secondary | ICD-10-CM | POA: Diagnosis not present

## 2017-04-17 DIAGNOSIS — Z131 Encounter for screening for diabetes mellitus: Secondary | ICD-10-CM | POA: Diagnosis not present

## 2017-04-17 DIAGNOSIS — F528 Other sexual dysfunction not due to a substance or known physiological condition: Secondary | ICD-10-CM | POA: Diagnosis not present

## 2017-04-17 DIAGNOSIS — Q8901 Asplenia (congenital): Secondary | ICD-10-CM | POA: Diagnosis not present

## 2017-04-17 DIAGNOSIS — Z1159 Encounter for screening for other viral diseases: Secondary | ICD-10-CM | POA: Diagnosis not present

## 2017-04-17 DIAGNOSIS — Z0000 Encounter for general adult medical examination without abnormal findings: Secondary | ICD-10-CM | POA: Diagnosis not present

## 2017-04-17 DIAGNOSIS — Z114 Encounter for screening for human immunodeficiency virus [HIV]: Secondary | ICD-10-CM

## 2017-04-17 DIAGNOSIS — H01139 Eczematous dermatitis of unspecified eye, unspecified eyelid: Secondary | ICD-10-CM

## 2017-04-17 DIAGNOSIS — Z125 Encounter for screening for malignant neoplasm of prostate: Secondary | ICD-10-CM

## 2017-04-17 DIAGNOSIS — Z130 Encounter for screening for diseases of the blood and blood-forming organs and certain disorders involving the immune mechanism: Secondary | ICD-10-CM | POA: Diagnosis not present

## 2017-04-17 LAB — CBC WITH DIFFERENTIAL/PLATELET
BASOS PCT: 1.2 % (ref 0.0–3.0)
Basophils Absolute: 0.1 10*3/uL (ref 0.0–0.1)
EOS PCT: 3.2 % (ref 0.0–5.0)
Eosinophils Absolute: 0.2 10*3/uL (ref 0.0–0.7)
HEMATOCRIT: 45.1 % (ref 39.0–52.0)
HEMOGLOBIN: 15.2 g/dL (ref 13.0–17.0)
LYMPHS PCT: 30.1 % (ref 12.0–46.0)
Lymphs Abs: 1.7 10*3/uL (ref 0.7–4.0)
MCHC: 33.7 g/dL (ref 30.0–36.0)
MCV: 92.9 fl (ref 78.0–100.0)
MONOS PCT: 12.8 % — AB (ref 3.0–12.0)
Monocytes Absolute: 0.7 10*3/uL (ref 0.1–1.0)
NEUTROS ABS: 3 10*3/uL (ref 1.4–7.7)
Neutrophils Relative %: 52.7 % (ref 43.0–77.0)
PLATELETS: 318 10*3/uL (ref 150.0–400.0)
RBC: 4.86 Mil/uL (ref 4.22–5.81)
RDW: 13.8 % (ref 11.5–15.5)
WBC: 5.7 10*3/uL (ref 4.0–10.5)

## 2017-04-17 LAB — LIPID PANEL
CHOL/HDL RATIO: 3
CHOLESTEROL: 183 mg/dL (ref 0–200)
HDL: 53.6 mg/dL (ref >39.00)
LDL CALC: 109 mg/dL — AB (ref 0–99)
NonHDL: 129.04
TRIGLYCERIDES: 101 mg/dL (ref 0.0–149.0)
VLDL: 20.2 mg/dL (ref 0.0–40.0)

## 2017-04-17 LAB — COMPREHENSIVE METABOLIC PANEL
ALBUMIN: 4.4 g/dL (ref 3.5–5.2)
ALT: 20 U/L (ref 0–53)
AST: 24 U/L (ref 0–37)
Alkaline Phosphatase: 78 U/L (ref 39–117)
BILIRUBIN TOTAL: 1.5 mg/dL — AB (ref 0.2–1.2)
BUN: 10 mg/dL (ref 6–23)
CALCIUM: 9.8 mg/dL (ref 8.4–10.5)
CHLORIDE: 103 meq/L (ref 96–112)
CO2: 32 meq/L (ref 19–32)
Creatinine, Ser: 0.95 mg/dL (ref 0.40–1.50)
GFR: 87.98 mL/min (ref >60.00)
Glucose, Bld: 103 mg/dL — ABNORMAL HIGH (ref 70–99)
Potassium: 5.3 mEq/L — ABNORMAL HIGH (ref 3.5–5.1)
Sodium: 140 mEq/L (ref 135–145)
Total Protein: 7.3 g/dL (ref 6.0–8.3)

## 2017-04-17 LAB — HEMOGLOBIN A1C: Hgb A1c MFr Bld: 5.6 % (ref 4.6–6.5)

## 2017-04-17 LAB — PSA: PSA: 2.87 ng/mL (ref 0.10–4.00)

## 2017-04-17 MED ORDER — SILDENAFIL CITRATE 100 MG PO TABS: 100.0000 mg | ORAL_TABLET | Freq: Every day | ORAL | 6 refills | Status: DC | PRN

## 2017-04-17 NOTE — Patient Instructions (Signed)
 Health Maintenance, Male A healthy lifestyle and preventive care is important for your health and wellness. Ask your health care provider about what schedule of regular examinations is right for you. What should I know about weight and diet?  Eat a Healthy Diet  Eat plenty of vegetables, fruits, whole grains, low-fat dairy products, and lean protein.  Do not eat a lot of foods high in solid fats, added sugars, or salt. Maintain a Healthy Weight  Regular exercise can help you achieve or maintain a healthy weight. You should:  Do at least 150 minutes of exercise each week. The exercise should increase your heart rate and make you sweat (moderate-intensity exercise).  Do strength-training exercises at least twice a week. Watch Your Levels of Cholesterol and Blood Lipids  Have your blood tested for lipids and cholesterol every 5 years starting at 54 years of age. If you are at high risk for heart disease, you should start having your blood tested when you are 54 years old. You may need to have your cholesterol levels checked more often if:  Your lipid or cholesterol levels are high.  You are older than 54 years of age.  You are at high risk for heart disease. What should I know about cancer screening? Many types of cancers can be detected early and may often be prevented. Lung Cancer  You should be screened every year for lung cancer if:  You are a current smoker who has smoked for at least 30 years.  You are a former smoker who has quit within the past 15 years.  Talk to your health care provider about your screening options, when you should start screening, and how often you should be screened. Colorectal Cancer  Routine colorectal cancer screening usually begins at 54 years of age and should be repeated every 5-10 years until you are 54 years old. You may need to be screened more often if early forms of precancerous polyps or small growths are found. Your health care provider  may recommend screening at an earlier age if you have risk factors for colon cancer.  Your health care provider may recommend using home test kits to check for hidden blood in the stool.  A small camera at the end of a tube can be used to examine your colon (sigmoidoscopy or colonoscopy). This checks for the earliest forms of colorectal cancer. Prostate and Testicular Cancer  Depending on your age and overall health, your health care provider may do certain tests to screen for prostate and testicular cancer.  Talk to your health care provider about any symptoms or concerns you have about testicular or prostate cancer. Skin Cancer  Check your skin from head to toe regularly.  Tell your health care provider about any new moles or changes in moles, especially if:  There is a change in a mole's size, shape, or color.  You have a mole that is larger than a pencil eraser.  Always use sunscreen. Apply sunscreen liberally and repeat throughout the day.  Protect yourself by wearing long sleeves, pants, a wide-brimmed hat, and sunglasses when outside. What should I know about heart disease, diabetes, and high blood pressure?  If you are 18-39 years of age, have your blood pressure checked every 3-5 years. If you are 40 years of age or older, have your blood pressure checked every year. You should have your blood pressure measured twice-once when you are at a hospital or clinic, and once when you are not at   hospital or clinic. Record the average of the two measurements. To check your blood pressure when you are not at a hospital or clinic, you can use:  An automated blood pressure machine at a pharmacy.  A home blood pressure monitor.  Talk to your health care provider about your target blood pressure.  If you are between 56-54 years old, ask your health care provider if you should take aspirin to prevent heart disease.  Have regular diabetes screenings by checking your fasting blood sugar  level.  If you are at a normal weight and have a low risk for diabetes, have this test once every three years after the age of 82.  If you are overweight and have a high risk for diabetes, consider being tested at a younger age or more often.  A one-time screening for abdominal aortic aneurysm (AAA) by ultrasound is recommended for men aged 8-75 years who are current or former smokers. What should I know about preventing infection? Hepatitis B  If you have a higher risk for hepatitis B, you should be screened for this virus. Talk with your health care provider to find out if you are at risk for hepatitis B infection. Hepatitis C  Blood testing is recommended for:  Everyone born from [birth year] through 1963.  Anyone with known risk factors for hepatitis C. Sexually Transmitted Diseases (STDs)  You should be screened each year for STDs including gonorrhea and chlamydia if:  You are sexually active and are younger than 54 years of age.  You are older than 54 years of age and your health care provider tells you that you are at risk for this type of infection.  Your sexual activity has changed since you were last screened and you are at an increased risk for chlamydia or gonorrhea. Ask your health care provider if you are at risk.  Talk with your health care provider about whether you are at high risk of being infected with HIV. Your health care provider may recommend a prescription medicine to help prevent HIV infection. What else can I do?  Schedule regular health, dental, and eye exams.  Stay current with your vaccines (immunizations).  Do not use any tobacco products, such as cigarettes, chewing tobacco, and e-cigarettes. If you need help quitting, ask your health care provider.  Limit alcohol intake to no more than 2 drinks per day. One drink equals 12 ounces of beer, 5 ounces of wine, or 1 ounces of hard liquor.  Do not use street drugs.  Do not share needles.  Ask your health  care provider for help if you need support or information about quitting drugs.  Tell your health care provider if you often feel depressed.  Tell your health care provider if you have ever been abused or do not feel safe at home. This information is not intended to replace advice given to you by your health care provider. Make sure you discuss any questions you have with your health care provider. Document Released: 05/29/2008 Document Revised: 07/30/2016 Document Reviewed: 09/04/2015 Elsevier Interactive Patient Education  2017 [place].   Please help [place] help you:  We are honored you have chosen [facility] for your Primary Care home. Below you will find basic instructions that you may need to access in the future. Please help [place] help you by reading the instructions, which cover many of the frequent questions we experience.   Prescription refills and request:  -In order to allow more efficient response time,  please call your pharmacy for all refills. They will forward the request electronically to [place]. This allows for the quickest possible response. Request left on a nurse line can take longer to refill, since these are checked as time allows between office patients and other phone calls.  - refill request can take up to 3-5 working days to complete.  - If request is sent electronically and request is appropiate, it is usually completed in 1-2 business days.  - all patients will need to be seen routinely for all chronic medical conditions requiring prescription medications (see follow-up below). If you are overdue for follow up on your condition, you will be asked to make an appointment and we will call in enough medication to cover you until your appointment (up to 30 days).  - all controlled substances will require a face to face visit to request/refill.  - if you desire your prescriptions to go through a new pharmacy, and have an active script at original pharmacy, you will need to  call your pharmacy and have scripts transferred to new pharmacy. This is completed between the pharmacy locations and not by your provider.    Results: If any images or labs were ordered, it can take up to 1 week to get results depending on the test ordered and the lab/facility running and resulting the test. - Normal or stable results, which do not need further discussion, may be released to your mychart immediately with attached note to you. A call may not be generated for normal results. Please make certain to sign up for mychart. If you have questions on how to activate your mychart you can call the front office.  - If your results need further discussion, our office will attempt to contact you via phone, and if unable to reach you after 2 attempts, we will release your abnormal result to your mychart with instructions.  - All results will be automatically released in mychart after 1 week.  - Your provider will provide you with explanation and instruction on all relevant material in your results. Please keep in mind, results and labs may appear confusing or abnormal to the untrained eye, but it does not mean they are actually abnormal for you personally. If you have any questions about your results that are not covered, or you desire more detailed explanation than what was provided, you should make an appointment with your provider to do so.   Our office handles many outgoing and incoming calls daily. If we have not contacted you within 1 week about your results, please check your mychart to see if there is a message first and if not, then contact our office.  In helping with this matter, you help decrease call volume, and therefore allow [place] to be able to respond to patients needs more efficiently.   Acute office visits (sick visit):  An acute visit is intended for a new problem and are scheduled in shorter time slots to allow schedule openings for patients with new problems. This is the appropriate  visit to discuss a new problem. In order to provide you with excellent quality medical care with proper time for you to explain your problem, have an exam and receive treatment with instructions, these appointments should be limited to one new problem per visit. If you experience a new problem, in which you desire to be addressed, please make an acute office visit, we save openings on the schedule to accommodate you. Please do not save your new problem  for any other type of visit, let us take care of it properly and quickly for you.   Follow up visits:  Depending on your condition(s) your provider will need to see you routinely in order to provide you with quality care and prescribe medication(s). Most chronic conditions (Example: hypertension, Diabetes, depression/anxiety... etc), require visits a couple times a year. Your provider will instruct you on proper follow up for your personal medical conditions and history. Please make certain to make follow up appointments for your condition as instructed. Failing to do so could result in lapse in your medication treatment/refills. If you request a refill, and are overdue to be seen on a condition, we will always provide you with a 30 day script (once) to allow you time to schedule.    Medicare wellness (well visit): - we have a wonderful Nurse Maudie Mercury), that will meet with you and provide you will yearly medicare wellness visits. These visits should occur yearly (can not be scheduled less than 1 calendar year apart) and cover preventive health, immunizations, advance directives and screenings you are entitled to yearly through your medicare benefits. Do not miss out on your entitled benefits, this is when medicare will pay for these benefits to be ordered for you.  These are strongly encouraged by your provider and is the appropriate type of visit to make certain you are up to date with all preventive health benefits. If you have not had your medicare wellness exam  in the last 12 months, please make certain to schedule one by calling the office and schedule your medicare wellness with Maudie Mercury as soon as possible.   Yearly physical (well visit):  - Adults are recommended to be seen yearly for physicals. Check with your insurance and date of your last physical, most insurances require one calendar year between physicals. Physicals include all preventive health topics, screenings, medical exam and labs that are appropriate for gender/age and history. You may have fasting labs needed at this visit. This is a well visit (not a sick visit), new problems should not be covered during this visit (see acute visit).  - Pediatric patients are seen more frequently when they are younger. Your provider will advise you on well child visit timing that is appropriate for your their age. - This is not a medicare wellness visit. Medicare wellness exams do not have an exam portion to the visit. Some medicare companies allow for a physical, some do not allow a yearly physical. If your medicare allows a yearly physical you can schedule the medicare wellness with our nurse Maudie Mercury and have your physical with your provider after, on the same day. Please check with insurance for your full benefits.   Late Policy/No Shows:  - all new patients should arrive 15-30 minutes earlier than appointment to allow [place] time  to  obtain all personal demographics,  insurance information and for you to complete office paperwork. - All established patients should arrive 10-15 minutes earlier than appointment time to update all information and be checked in .  - In our best efforts to run on time, if you are late for your appointment you will be asked to either reschedule or if able, we will work you back into the schedule. There will be a wait time to work you back in the schedule,  depending on availability.  - If you are unable to make it to your appointment as scheduled, please call 24 hours ahead of time to allow  [place] to fill  the time slot with someone else who needs to be seen. If you do not cancel your appointment ahead of time, you may be charged a no show fee.

## 2017-04-17 NOTE — Progress Notes (Signed)
Patient ID: [name], male  DOB: 1961/03/02, 56 y.o.   MRN: [id number] Patient Care Team    Relationship Specialty Notifications Start End  Ma Hillock, DO PCP - General Family Medicine  04/09/16     Chief Complaint  Patient presents with  . Annual Exam    Subjective:  [name] is a 56 y.o. male present for CPE. All past medical history, surgical history, allergies, family history, immunizations, medications and social history were updated in the electronic medical record today. All recent labs, ED visits and hospitalizations within the last year were reviewed.  ED: would like refills of Viagra, unchanged urinary stream. Tolerating medicine without side effects, denies chest pain, SOB or dizziness with use.  GERD: he has restarted nexium 20 mg OTC. He reports he noticed when he gained back a few pounds the GERD returned. He is ok with using OTC.   Health maintenance:  Colonoscopy: No screening as of yet, he is ok with trying this year now. Immunizations: tdap 12/15/2013, Flu 2015, PNA23 (2012 and 2017), meningococcal 2010-splenectomy : repeat meningococcal today last time able to get menveo 2/2 to age (we do not carry other Menatrca) Infectious disease screening: HIV and Hep C indicated, Pt agreeable to testing today DEXA:Had been on prednisone long term at one time, consider early screen.  PSA: (0.78) 04/03/2015. Pt declines screening this year after discussion. Offer yearly.  PSA:  Lab Results  Component Value Date   PSA 0.78 04/03/2015   PSA 0.91 03/23/2014  , pt was counseled on prostate cancer screenings.  Assistive device: None Oxygen use: None Patient has a Dental home. Hospitalizations/ED visits: reviewed  Depression screen [facility] 2/9 04/17/2017  Decreased Interest 0  Down, Depressed, Hopeless 0  PHQ - 2 Score 0   No flowsheet data found.   Current Exercise Habits: The patient does not participate in regular exercise at present Exercise limited by:  None identified Fall Risk  04/17/2017  Falls in the past year? No    Immunization History  Administered Date(s) Administered  . Hep A / Hep B 03/22/2012, 04/19/2012, 09/13/2012  . Influenza Split 06/08/2012  . Influenza Whole 09/01/2012  . Meningococcal Mcv4o 04/17/2017  . Meningococcal Polysaccharide 10/23/2009  . Pneumococcal Conjugate-13 04/17/2017  . Pneumococcal Polysaccharide-23 03/18/2011, 04/09/2016  . Td 05/18/2007     Past Medical History:  Diagnosis Date  . Allergy   . Arthritis   . Asplenia   . Basal cell carcinoma   . Erectile dysfunction   . Facial fracture ([place])   . GERD (gastroesophageal reflux disease)   . History of chicken pox   . Melanoma of thigh ([place])    left  . Squamous cell carcinoma, arm    left   Allergies  Allergen Reactions  . Cephalexin Other (See Comments)    "Sore throat" REACTION: SORE THROAT  . Other     Bee sting  . Latex Rash  . Tape Rash   Past Surgical History:  Procedure Laterality Date  . CHOLECYSTECTOMY  2009  . MANDIBLE SURGERY    . MELANOMA EXCISION WITH SENTINEL LYMPH NODE BIOPSY  1999  . NASAL SINUS SURGERY    . ORIF ORBITAL FRACTURE    . PALATE SURGERY    . right arm  09/14/08   x2 plates/screws  . SPLENECTOMY, TOTAL  2008   trauma  . zygomtic Left 2015   fracture/plate-screw   Family History  Problem Relation Age of Onset  .  Arthritis Mother   . Arthritis Father   . Hearing loss Father   . Pancreatic cancer Maternal Grandfather   . Pancreatic cancer Paternal Grandfather    Social History   Social History  . Marital status: Divorced    Spouse name: N/A  . Number of children: N/A  . Years of education: N/A   Occupational History  . Not on file.   Social History Main Topics  . Smoking status: Former Smoker    Packs/day: 1.00    Quit date: 12/07/2011  . Smokeless tobacco: Never Used  . Alcohol use Yes  . Drug use: No  . Sexual activity: Yes    Birth control/ protection: Condom   Other Topics  Concern  . Not on file   Social History Narrative   Divorced, 1 son (Rylan).   2 years of college.   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    Former smoker (quit 12/07/2011), no drug use, 1-2 drinks a week.   Drinks caffeine, takes a daily vitamin   Wears his seatbelt, smoke detector in the home, firearms in the home.    Exercises > 3x a day   Feels safe in relationships   Allergies as of 04/17/2017      Reactions   Cephalexin Other (See Comments)   "Sore throat" REACTION: SORE THROAT   Other    Bee sting   Latex Rash   Tape Rash      Medication List       Accurate as of 04/17/17 10:17 AM. Always use your most recent med list.          albuterol 108 (90 Base) MCG/ACT inhaler Commonly known as:  PROVENTIL HFA;VENTOLIN HFA 1 puff 15 minutes prior to exercise   cetirizine 10 MG tablet Commonly known as:  ZYRTEC Take 10 mg by mouth daily.   esomeprazole 20 MG capsule Commonly known as:  NEXIUM TAKE ONE CAPSULE BY MOUTH EVERY DAY   flintstones complete 60 MG chewable tablet Chew 1 tablet by mouth daily.   halobetasol 0.05 % ointment Commonly known as:  ULTRAVATE APPLY TO AFFECTED AREA TWICE A DAY   predniSONE 20 MG tablet Commonly known as:  DELTASONE TAKE 1/2 TABLET ON MONDAY, WEDNESDAY, AND FRIDAY   sildenafil 100 MG tablet Commonly known as:  VIAGRA Take 1 tablet (100 mg total) by mouth daily as needed.      All past medical history, surgical history, allergies, family history, immunizations andmedications were updated in the EMR today and reviewed under the history and medication portions of their EMR.     No results found for this or any previous visit (from the past 2160 hour(s)).  Dg Wrist Complete Right  Result Date: 05/03/2015 CLINICAL DATA:  56 year old male with a history of right medial wrist pain five days after lifting weights. EXAM: RIGHT WRIST - COMPLETE 3+ VIEW COMPARISON:  None. FINDINGS: Surgical changes of prior plate screw  fixation of distal ulnar fracture. No perihardware fracture. No acute fracture line identified. Carpal bones maintain alignment. No significant soft tissue swelling.  No radiopaque foreign body. Degenerative changes of the first carpometacarpal joint. Vascular calcifications are present. IMPRESSION: Negative for acute bony abnormality. Surgical changes of prior plate screw fixation of distal ulna, incompletely imaged. Degenerative changes of the first carpometacarpal joint. Signed, Dulcy Fanny. Earleen Newport, DO [facility] Electronically Signed   By: Corrie Mckusick D.O.   On: 05/03/2015 12:38  ROS: 14 pt review of systems performed and negative (unless mentioned in an HPI)  Objective: BP 112/76 (BP Location: Left Arm, Patient Position: Sitting, Cuff Size: Large)   Pulse (!) 56   Temp 98.1 F (36.7 C)   Resp 20   Ht '5\' 11"'$  (1.803 m)   Wt 207 lb (93.9 kg)   SpO2 97%   BMI 28.87 kg/m  Gen: Afebrile. No acute distress. Nontoxic in appearance, well-developed, well-nourished,  Mildly overweight male.  HENT: AT. [place]. Bilateral TM visualized and normal in appearance, normal external auditory canal. MMM, no oral lesions, adequate dentition. Bilateral nares within normal limits. Throat without erythema, ulcerations or exudates. no Cough on exam, no hoarseness on exam. Eyes:Pupils Equal Round Reactive to light, Extraocular movements intact,  Conjunctiva without redness, discharge or icterus. Neck/lymp/endocrine: Supple,no lymphadenopathy, no thyromegaly CV: RRR no murmur, no edema, +2/4 P posterior tibialis pulses. no carotid bruits. No JVD. Chest: CTAB, no wheeze, rhonchi or crackles. normal Respiratory effort. good Air movement. Abd: Soft. flat. NTND. BS present. no Masses palpated. No hepatosplenomegaly. No rebound tenderness or guarding. Skin: no rashes, purpura or petechiae. Warm and well-perfused. Skin intact. Neuro/Msk:  Normal gait. PERLA. EOMi.  Alert. Oriented x3.  Cranial nerves II through XII intact. Muscle strength 5/5 upper/lower extremity. DTRs equal bilaterally. Psych: Normal affect, dress and demeanor. Normal speech. Normal thought content and judgment.  No exam data present  Assessment/plan: [name] is a 56 y.o. male present for CPE Screening for deficiency anemia - CBC w/Diff Prostate cancer screening - PSA Screening for diabetes mellitus - HgB A1c Lipid screening - Lipid panel ESOPHAGITIS, REFLUX - cot OTC nexium PRN. Lowest dose possible. Weight loss. Avoid food triggers.  Colon cancer screening - Ambulatory referral to Gastroenterology Asplenia - Pneumococcal conjugate vaccine 13-valent IM - MENINGOCOCCAL MCV4O BMI 28.0-28.9,adult Diet and exercise counseling provided.  - Comp Met (CMET) - Lipid panel - HgB A1c ERECTILE DYSFUNCTION, MILD - Continue sildenafil (VIAGRA) 100 MG tablet; Take 1 tablet (100 mg total) by mouth daily as needed.  Dispense: 10 tablet; Refill: 6 Encounter for screening for HIV Pt agreeable to testing today - HIV antibody Encounter for hepatitis C screening test for low risk patient Pt agreeable to testing today - Hepatitis C Antibody Need for 23-polyvalent pneumococcal polysaccharide vaccine - Pneumococcal conjugate vaccine 13-valent  Immunization due - Pneumococcal conjugate vaccine 13-valent IM - MENINGOCOCCAL MCV4O Encounter for preventive health examination Patient was encouraged to exercise greater than 150 minutes a week. Patient was encouraged to choose a diet filled with fresh fruits and vegetables, and lean meats. AVS provided to patient today for education/recommendation on gender specific health and safety maintenance. Colonoscopy: No screening as of yet, he is ok with trying this year now. Referral placed again.   Immunizations: tdap 12/15/2013, Flu 2015, PNA23 (2012 and 2017), meningococcal 2010-splenectomy : repeat meningococcal today last time able to get  menveo 2/2 to age (we do not carry other Menatrca)- prevnar 13 today Infectious disease screening: HIV and Hep C indicated, Pt agreeable to testing today DEXA:Had been on prednisone long term at one time, consider early screen.  Return in about 1 year (around 04/17/2018) for CPE.  Note is dictated utilizing voice recognition software. Although note has been proof read prior to signing, occasional typographical errors still can be missed. If any questions arise, please do not hesitate to call for verification.  Electronically signed by: Howard Pouch, DO [place]

## 2017-04-18 LAB — HEPATITIS C ANTIBODY: HCV AB: NEGATIVE

## 2017-04-18 LAB — HIV ANTIBODY (ROUTINE TESTING W REFLEX): HIV: NONREACTIVE

## 2017-04-20 ENCOUNTER — Telehealth: Payer: Self-pay | Admitting: Family Medicine

## 2017-04-20 NOTE — Telephone Encounter (Signed)
Detailed message left on voice mail. Okay per DPR. 

## 2017-04-20 NOTE — Telephone Encounter (Signed)
Please call pt: - his labs are stable.  - CPE in one year unless needed sooner.

## 2017-06-11 ENCOUNTER — Telehealth: Payer: Self-pay | Admitting: Family Medicine

## 2017-06-11 ENCOUNTER — Other Ambulatory Visit: Payer: Self-pay | Admitting: *Deleted

## 2017-06-11 DIAGNOSIS — F528 Other sexual dysfunction not due to a substance or known physiological condition: Secondary | ICD-10-CM

## 2017-06-11 MED ORDER — SILDENAFIL CITRATE 100 MG PO TABS
100.0000 mg | ORAL_TABLET | Freq: Every day | ORAL | 4 refills | Status: DC | PRN
Start: 2017-06-11 — End: 2017-06-12

## 2017-06-11 NOTE — Telephone Encounter (Signed)
Patient states that he has to have a new RX for viagra but it has to only be the generic.  Can this be sent to [facility] in [place].

## 2017-06-11 NOTE — Telephone Encounter (Signed)
Patient called requesting his Viagra Rx be sent to [facility] in [facility] . His current Rx was sent to [facility] . Reprinted Rx for Dr Raoul Pitch to sign . Called and cancelled Rx at [facility]. Rx faxed to [facility].

## 2017-06-11 NOTE — Telephone Encounter (Signed)
New Rx sent . Called [facility] and cancelled previous Rx.

## 2017-06-12 ENCOUNTER — Other Ambulatory Visit: Payer: Self-pay | Admitting: *Deleted

## 2017-06-12 DIAGNOSIS — F528 Other sexual dysfunction not due to a substance or known physiological condition: Secondary | ICD-10-CM

## 2017-06-12 MED ORDER — SILDENAFIL CITRATE 20 MG PO TABS: ORAL_TABLET | ORAL | 4 refills | Status: DC

## 2017-06-12 NOTE — Addendum Note (Signed)
Addended by: Leota Jacobsen on: 06/12/2017 01:51 PM   Modules accepted: Orders

## 2017-06-18 ENCOUNTER — Encounter: Payer: Self-pay | Admitting: Family Medicine

## 2018-01-15 ENCOUNTER — Ambulatory Visit: Payer: 59 | Admitting: Family Medicine

## 2018-01-15 ENCOUNTER — Encounter: Payer: Self-pay | Admitting: Family Medicine

## 2018-01-15 VITALS — BP 125/80 | HR 64 | Temp 98.0°F | Resp 20 | Wt 206.8 lb

## 2018-01-15 DIAGNOSIS — L989 Disorder of the skin and subcutaneous tissue, unspecified: Secondary | ICD-10-CM | POA: Diagnosis not present

## 2018-01-15 DIAGNOSIS — Z85820 Personal history of malignant melanoma of skin: Secondary | ICD-10-CM | POA: Diagnosis not present

## 2018-01-15 NOTE — Patient Instructions (Signed)
I have placed a urgent referral to dermatology to have this taken care of correctly. They will call to schedule you.    Melanoma Melanoma is a form of skin cancer that begins in melanocytes. Melanocytes are the skin cells that produce pigment. Melanoma starts as a mole on the skin and can spread to other parts of the body. If found early, many cases of melanoma are curable. What are the causes? The exact cause is unknown. What increases the risk?  Spending a lot of time in the sun, under a sunlamp, or in a tanning booth.  Having sunburn that blisters. The more blistering sunburns you have, the higher your risk of melanoma.  Spending time in parts of the world with more intense sunlight.  Living in a hot, sunny climate.  Having fair skin that does not tan easily.  Having had melanoma before.  Having a family history of melanoma.  Having more than 100 skin moles. What are the signs or symptoms?  ABCDE changes in a mole. ABCDE stands for: ? Asymmetry. This means the mole has an irregular shape. It is not round or oval. ? Border. This means the mole has an irregular or bumpy border. ? Color. This means the mole has multiple colors in it, including brown, black, blue, red, or tan. ? Diameter. This means the mole is more than 0.2 in (6 mm) across. ? Evolving. This refers to any unusual changes or symptoms in the mole, such as pain, itching, stinging, sensitivity, or bleeding.  A new mole.  Swollen lymph nodes.  Shortness of breath.  Bone pain.  Headache.  Seizures.  Visual problems. How is this diagnosed? Your health care provider will take a tissue sample from the mole to examine under a microscope (biopsy). The biopsy will reveal whether melanoma has spread to deeper layers of the skin. Your health care provider will also order tests, including:  Blood tests.  Chest X-rays.  A CT scan.  A bone scan.  How is this treated? You will have surgery to remove the  cancer. Your lymph nodes may also be removed during the surgery. If melanoma has spread to other organs, such as the liver, lungs, bone, or brain, you will need additional treatment. How is this prevented? Melanoma may come back (recur) after it has been treated. Your risk of recurrence is higher if you had thick or ulcerated tumors or patches of tumors. Generally, the more advanced your melanoma, the more likely it will recur. You can help prevent melanoma from recurring by staying out of the sun, especially during peak midafternoon hours. When you are outdoors or in the sun:  Wear long sleeves, a hat, and sunglasses that block UV light when possible.  Apply a sunscreen with an SPF of 30 or higher regularly.  Take these precautions on cloudy days and in the winter, even if you will be outdoors for only a short period of time. Contact a health care provider if:  You notice any new growths or changes in your skin.  You have had melanoma removed and you notice a new growth in the same location. This information is not intended to replace advice given to you by your health care provider. Make sure you discuss any questions you have with your health care provider. Document Released: 12/01/2005 Document Revised: 05/08/2016 Document Reviewed: 01/25/2014 Elsevier Interactive Patient Education  [employer].

## 2018-01-15 NOTE — Progress Notes (Signed)
[name] , [birth year], 57 y.o., male MRN: [id number] Patient Care Team    Relationship Specialty Notifications Start End  Ma Hillock, DO PCP - General Family Medicine  04/09/16     Chief Complaint  Patient presents with  . finger lesion    left middle finger     Subjective: Pt presents for an OV with complaints of lesion left middle finger of 6 months duration. Significant medical h/o melanoma of thigh with wide excision and interferon treatment.  Associated symptoms include dark pigmented, irregular bordered lesion. Last skin check over a year ago with Dr. Ronnald Ramp dermatology.   Depression screen [facility] 2/9 01/15/2018 04/17/2017  Decreased Interest 0 0  Down, Depressed, Hopeless 0 0  PHQ - 2 Score 0 0    Allergies  Allergen Reactions  . Cephalexin Other (See Comments)    "Sore throat" REACTION: SORE THROAT  . Other     Bee sting  . Latex Rash  . Tape Rash   Social History   Tobacco Use  . Smoking status: Former Smoker    Packs/day: 1.00    Last attempt to quit: 12/07/2011    Years since quitting: 6.1  . Smokeless tobacco: Never Used  Substance Use Topics  . Alcohol use: Yes   Past Medical History:  Diagnosis Date  . Allergy   . Arthritis   . Asplenia   . Basal cell carcinoma   . Erectile dysfunction   . Facial fracture ([place])   . GERD (gastroesophageal reflux disease)   . History of chicken pox   . Melanoma of thigh ([place])    left  . Squamous cell carcinoma, arm    left   Past Surgical History:  Procedure Laterality Date  . CHOLECYSTECTOMY  2009  . MANDIBLE SURGERY    . MELANOMA EXCISION WITH SENTINEL LYMPH NODE BIOPSY  1999  . NASAL SINUS SURGERY    . ORIF ORBITAL FRACTURE    . PALATE SURGERY    . right arm  09/14/08   x2 plates/screws  . SPLENECTOMY, TOTAL  2008   trauma  . zygomtic Left 2015   fracture/plate-screw   Family History  Problem Relation Age of Onset  . Arthritis Mother   . Arthritis Father   . Hearing loss Father   .  Pancreatic cancer Maternal Grandfather   . Pancreatic cancer Paternal Grandfather    Allergies as of 01/15/2018      Reactions   Cephalexin Other (See Comments)   "Sore throat" REACTION: SORE THROAT   Other    Bee sting   Latex Rash   Tape Rash      Medication List        Accurate as of 01/15/18  8:32 AM. Always use your most recent med list.          cetirizine 10 MG tablet Commonly known as:  ZYRTEC Take 10 mg by mouth daily.   esomeprazole 20 MG capsule Commonly known as:  NEXIUM TAKE ONE CAPSULE BY MOUTH EVERY DAY   flintstones complete 60 MG chewable tablet Chew 1 tablet by mouth daily.   halobetasol 0.05 % ointment Commonly known as:  ULTRAVATE APPLY TO AFFECTED AREA TWICE A DAY   predniSONE 20 MG tablet Commonly known as:  DELTASONE TAKE 1/2 TABLET ON MONDAY, WEDNESDAY, AND FRIDAY   sildenafil 20 MG tablet Commonly known as:  REVATIO 1-5 tabs PO prior to sexually activity.       All past medical  history, surgical history, allergies, family history, immunizations andmedications were updated in the EMR today and reviewed under the history and medication portions of their EMR.     ROS: Negative, with the exception of above mentioned in HPI   Objective:  BP 125/80 (BP Location: Right Arm, Patient Position: Sitting, Cuff Size: Large)   Pulse 64   Temp 98 F (36.7 C)   Resp 20   Wt 206 lb 12 oz (93.8 kg)   SpO2 97%   BMI 28.84 kg/m  Body mass index is 28.84 kg/m. Gen: Afebrile. No acute distress. Nontoxic in appearance, well developed, well nourished.  Skin: ~ 5x4 mm dark raised irregular border lesion left 3rd proximal phalange.  No rashes, purpura or petechiae.    No exam data present No results found. No results found for this or any previous visit (from the past 24 hour(s)).  Assessment/Plan: [name] is a 57 y.o. male present for OV for  Skin lesion Personal  history of melanoma - pt reports lesion is consistent to prior look of  his thigh melanoma. Current lesion present for at least 6 months.  - He is established with derm, will refer urgently to get biopsy completed.  - Ambulatory referral to Dermatology   Reviewed expectations re: course of current medical issues.  Discussed self-management of symptoms.  Outlined signs and symptoms indicating need for more acute intervention.  Patient verbalized understanding and all questions were answered.  Patient received an After-Visit Summary.    No orders of the defined types were placed in this encounter.    Note is dictated utilizing voice recognition software. Although note has been proof read prior to signing, occasional typographical errors still can be missed. If any questions arise, please do not hesitate to call for verification.   electronically signed by:  Howard Pouch, DO  [place]

## 2018-04-20 ENCOUNTER — Ambulatory Visit (INDEPENDENT_AMBULATORY_CARE_PROVIDER_SITE_OTHER): Payer: 59 | Admitting: Family Medicine

## 2018-04-20 ENCOUNTER — Encounter: Payer: Self-pay | Admitting: Family Medicine

## 2018-04-20 VITALS — BP 116/73 | HR 55 | Temp 98.4°F | Resp 20 | Ht 71.0 in | Wt 212.0 lb

## 2018-04-20 DIAGNOSIS — Z1211 Encounter for screening for malignant neoplasm of colon: Secondary | ICD-10-CM | POA: Diagnosis not present

## 2018-04-20 DIAGNOSIS — Z9081 Acquired absence of spleen: Secondary | ICD-10-CM

## 2018-04-20 DIAGNOSIS — Z79899 Other long term (current) drug therapy: Secondary | ICD-10-CM

## 2018-04-20 DIAGNOSIS — K5901 Slow transit constipation: Secondary | ICD-10-CM

## 2018-04-20 DIAGNOSIS — Z131 Encounter for screening for diabetes mellitus: Secondary | ICD-10-CM | POA: Diagnosis not present

## 2018-04-20 DIAGNOSIS — Q8901 Asplenia (congenital): Secondary | ICD-10-CM | POA: Diagnosis not present

## 2018-04-20 DIAGNOSIS — Z125 Encounter for screening for malignant neoplasm of prostate: Secondary | ICD-10-CM

## 2018-04-20 DIAGNOSIS — H01139 Eczematous dermatitis of unspecified eye, unspecified eyelid: Secondary | ICD-10-CM

## 2018-04-20 DIAGNOSIS — E663 Overweight: Secondary | ICD-10-CM | POA: Diagnosis not present

## 2018-04-20 DIAGNOSIS — F528 Other sexual dysfunction not due to a substance or known physiological condition: Secondary | ICD-10-CM | POA: Diagnosis not present

## 2018-04-20 DIAGNOSIS — Z0000 Encounter for general adult medical examination without abnormal findings: Secondary | ICD-10-CM | POA: Diagnosis not present

## 2018-04-20 DIAGNOSIS — Z130 Encounter for screening for diseases of the blood and blood-forming organs and certain disorders involving the immune mechanism: Secondary | ICD-10-CM | POA: Diagnosis not present

## 2018-04-20 LAB — COMPREHENSIVE METABOLIC PANEL
ALK PHOS: 89 U/L (ref 39–117)
ALT: 27 U/L (ref 0–53)
AST: 26 U/L (ref 0–37)
Albumin: 4 g/dL (ref 3.5–5.2)
BILIRUBIN TOTAL: 1.2 mg/dL (ref 0.2–1.2)
BUN: 12 mg/dL (ref 6–23)
CALCIUM: 9.2 mg/dL (ref 8.4–10.5)
CO2: 32 mEq/L (ref 19–32)
CREATININE: 0.83 mg/dL (ref 0.40–1.50)
Chloride: 103 mEq/L (ref 96–112)
GFR: 102.43 mL/min (ref >60.00)
Glucose, Bld: 106 mg/dL — ABNORMAL HIGH (ref 70–99)
Potassium: 4.9 mEq/L (ref 3.5–5.1)
SODIUM: 141 meq/L (ref 135–145)
Total Protein: 6.8 g/dL (ref 6.0–8.3)

## 2018-04-20 LAB — CBC WITH DIFFERENTIAL/PLATELET
Basophils Absolute: 0.1 10*3/uL (ref 0.0–0.1)
Basophils Relative: 1.4 % (ref 0.0–3.0)
Eosinophils Absolute: 0.4 10*3/uL (ref 0.0–0.7)
Eosinophils Relative: 4.9 % (ref 0.0–5.0)
HEMATOCRIT: 43.1 % (ref 39.0–52.0)
Hemoglobin: 14.5 g/dL (ref 13.0–17.0)
LYMPHS PCT: 22.2 % (ref 12.0–46.0)
Lymphs Abs: 1.8 10*3/uL (ref 0.7–4.0)
MCHC: 33.7 g/dL (ref 30.0–36.0)
MCV: 92.6 fl (ref 78.0–100.0)
Monocytes Absolute: 1 10*3/uL (ref 0.1–1.0)
Monocytes Relative: 12.2 % — ABNORMAL HIGH (ref 3.0–12.0)
NEUTROS ABS: 4.8 10*3/uL (ref 1.4–7.7)
Neutrophils Relative %: 59.3 % (ref 43.0–77.0)
PLATELETS: 309 10*3/uL (ref 150.0–400.0)
RBC: 4.66 Mil/uL (ref 4.22–5.81)
RDW: 13.9 % (ref 11.5–15.5)
WBC: 8.1 10*3/uL (ref 4.0–10.5)

## 2018-04-20 LAB — LIPID PANEL
CHOLESTEROL: 152 mg/dL (ref 0–200)
HDL: 42.5 mg/dL (ref >39.00)
LDL Cholesterol: 89 mg/dL (ref 0–99)
NONHDL: 109.61
Total CHOL/HDL Ratio: 4
Triglycerides: 101 mg/dL (ref 0.0–149.0)
VLDL: 20.2 mg/dL (ref 0.0–40.0)

## 2018-04-20 LAB — HEMOGLOBIN A1C: HEMOGLOBIN A1C: 5.7 % (ref 4.6–6.5)

## 2018-04-20 LAB — TSH: TSH: 0.83 u[IU]/mL (ref 0.35–4.50)

## 2018-04-20 LAB — PSA: PSA: 0.91 ng/mL (ref 0.10–4.00)

## 2018-04-20 MED ORDER — HALOBETASOL PROPIONATE 0.05 % EX OINT: TOPICAL_OINTMENT | CUTANEOUS | 3 refills | Status: DC

## 2018-04-20 MED ORDER — SILDENAFIL CITRATE 20 MG PO TABS: ORAL_TABLET | ORAL | 4 refills | Status: DC

## 2018-04-20 NOTE — Progress Notes (Signed)
Patient ID: [name], male  DOB: 20-Jun-1961, 57 y.o.   MRN: [id number] Patient Care Team    Relationship Specialty Notifications Start End  Ma Hillock, DO PCP - General Family Medicine  04/09/16     Chief Complaint  Patient presents with  . Annual Exam    Subjective:  [name] is a 57 y.o. male present for CPE. All past medical history, surgical history, allergies, family history, immunizations, medications and social history were updated in the electronic medical record today. All recent labs, ED visits and hospitalizations within the last year were reviewed.  ED: wants refills of Viagra, unchanged urinary stream. Tolerating medicine without side effects, denies chest pain, SOB or dizziness with use.   Health maintenance:  Colonoscopy: No screening as of yet, ordered placed last year and he did not schedule, he is willing to try again.    Immunizations: tdap 12/15/2013, Flu 2015, PNA23 (2012 and 2017), meningococcal 2010-splenectomy : repeat meningococcal today last time able to get menveo 2/2 to age (we do not carry other Menatrca) Infectious disease screening: HIV and Hep C completed DEXA:Had been on prednisone long term at one time, consider early screen.  PSA: (0.78) 04/03/2015. Lab Results  Component Value Date   PSA 2.87 04/17/2017   PSA 0.78 04/03/2015   PSA 0.91 03/23/2014  , pt was counseled on prostate cancer screenings.  Assistive device: none Oxygen XLK:GMWN Patient has a Dental home. Hospitalizations/ED visits: reviewed  Depression screen [facility] 2/9 04/20/2018 01/15/2018 04/17/2017  Decreased Interest 0 0 0  Down, Depressed, Hopeless 0 0 0  PHQ - 2 Score 0 0 0   No flowsheet data found.   Current Exercise Habits: Home exercise routine, Type of exercise: strength training/weights, Time (Minutes): 30, Frequency (Times/Week): 3, Weekly Exercise (Minutes/Week): 90, Intensity: Mild   Fall Risk  04/20/2018 04/17/2017  Falls in the past year? No No     Immunization History  Administered Date(s) Administered  . Hep A / Hep B 03/22/2012, 04/19/2012, 09/13/2012  . Influenza Split 06/08/2012  . Influenza Whole 09/01/2012  . Meningococcal Mcv4o 04/17/2017  . Meningococcal Polysaccharide 10/23/2009  . Pneumococcal Conjugate-13 04/17/2017  . Pneumococcal Polysaccharide-23 03/18/2011, 04/09/2016  . Td 05/18/2007     Past Medical History:  Diagnosis Date  . Allergy   . Arthritis   . Asplenia   . Basal cell carcinoma   . Erectile dysfunction   . Facial fracture ([place])   . GERD (gastroesophageal reflux disease)   . History of chicken pox   . Melanoma of thigh ([place])    left  . Squamous cell carcinoma, arm    left   Allergies  Allergen Reactions  . Cephalexin Other (See Comments)    "Sore throat" REACTION: SORE THROAT  . Other     Bee sting  . Latex Rash  . Tape Rash   Past Surgical History:  Procedure Laterality Date  . CHOLECYSTECTOMY  2009  . MANDIBLE SURGERY    . MELANOMA EXCISION WITH SENTINEL LYMPH NODE BIOPSY  1999  . NASAL SINUS SURGERY    . ORIF ORBITAL FRACTURE    . PALATE SURGERY    . right arm  09/14/08   x2 plates/screws  . SPLENECTOMY, TOTAL  2008   trauma  . zygomtic Left 2015   fracture/plate-screw   Family History  Problem Relation Age of Onset  . Arthritis Mother   . Arthritis Father   . Hearing loss Father   .  Pancreatic cancer Maternal Grandfather   . Pancreatic cancer Paternal Grandfather    Social History   Socioeconomic History  . Marital status: Divorced    Spouse name: Not on file  . Number of children: Not on file  . Years of education: Not on file  . Highest education level: Not on file  Occupational History  . Not on file  Social Needs  . Financial resource strain: Not on file  . Food insecurity:    Worry: Not on file    Inability: Not on file  . Transportation needs:    Medical: Not on file    Non-medical: Not on file  Tobacco Use  . Smoking status: Former Smoker     Packs/day: 1.00    Last attempt to quit: 12/07/2011    Years since quitting: 6.3  . Smokeless tobacco: Never Used  Substance and Sexual Activity  . Alcohol use: Yes  . Drug use: No  . Sexual activity: Yes    Birth control/protection: Condom  Lifestyle  . Physical activity:    Days per week: Not on file    Minutes per session: Not on file  . Stress: Not on file  Relationships  . Social connections:    Talks on phone: Not on file    Gets together: Not on file    Attends religious service: Not on file    Active member of club or organization: Not on file    Attends meetings of clubs or organizations: Not on file    Relationship status: Not on file  . Intimate partner violence:    Fear of current or ex partner: Not on file    Emotionally abused: Not on file    Physically abused: Not on file    Forced sexual activity: Not on file  Other Topics Concern  . Not on file  Social History Narrative   Divorced, 1 son (Rylan).   2 years of college.   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    Former smoker (quit 12/07/2011), no drug use, 1-2 drinks a week.   Drinks caffeine, takes a daily vitamin   Wears his seatbelt, smoke detector in the home, firearms in the home.    Exercises > 3x a day   Feels safe in relationships   Allergies as of 04/20/2018      Reactions   Cephalexin Other (See Comments)   "Sore throat" REACTION: SORE THROAT   Other    Bee sting   Latex Rash   Tape Rash      Medication List        Accurate as of 04/20/18  8:27 AM. Always use your most recent med list.          cetirizine 10 MG tablet Commonly known as:  ZYRTEC Take 10 mg by mouth daily.   esomeprazole 20 MG capsule Commonly known as:  NEXIUM TAKE ONE CAPSULE BY MOUTH EVERY DAY   flintstones complete 60 MG chewable tablet Chew 1 tablet by mouth daily.   halobetasol 0.05 % ointment Commonly known as:  ULTRAVATE APPLY TO AFFECTED AREA TWICE A DAY   predniSONE 20 MG  tablet Commonly known as:  DELTASONE TAKE 1/2 TABLET ON MONDAY, WEDNESDAY, AND FRIDAY   sildenafil 20 MG tablet Commonly known as:  REVATIO 1-5 tabs PO prior to sexually activity.      All past medical history, surgical history, allergies, family history, immunizations andmedications were updated in the EMR today and reviewed  under the history and medication portions of their EMR.     No results found for this or any previous visit (from the past 2160 hour(s)).  ROS: 14 pt review of systems performed and negative (unless mentioned in an HPI)  Objective: BP 116/73 (BP Location: Right Arm, Patient Position: Sitting, Cuff Size: Large)   Pulse (!) 55   Temp 98.4 F (36.9 C)   Resp 20   Ht '5\' 11"'$  (1.803 m)   Wt 212 lb (96.2 kg)   SpO2 97%   BMI 29.57 kg/m  Gen: Afebrile. No acute distress. Nontoxic in appearance, well-developed, well-nourished,  Overweight, pleasant caucasian male.  HENT: AT. [place]. Bilateral TM visualized and normal in appearance, normal external auditory canal. MMM, no oral lesions, adequate dentition. Bilateral nares within normal limits. Throat without erythema, ulcerations or exudates. no Cough on exam, no hoarseness on exam. Eyes:Pupils Equal Round Reactive to light, Extraocular movements intact,  Conjunctiva without redness, discharge or icterus. Neck/lymp/endocrine: Supple,no lymphadenopathy, no thyromegaly CV: RRR no murmur, no edema, +2/4 P posterior tibialis pulses. no carotid bruits. No JVD. Chest: CTAB, no wheeze, rhonchi or crackles. normal Respiratory effort. good Air movement. Abd: Soft. flat. NTND. BS present. no Masses palpated. No hepatosplenomegaly. No rebound tenderness or guarding. Skin: no rashes, purpura or petechiae. Warm and well-perfused. Skin intact. Neuro/Msk:  Normal gait. PERLA. EOMi. Alert. Oriented x3.  Cranial nerves II through XII intact. Muscle strength 5/5 upper/lower extremity. DTRs equal bilaterally. Psych: Normal affect, dress and  demeanor. Normal speech. Normal thought content and judgment.  No exam data present  Assessment/plan: WILLIA LAMPERT is a 57 y.o. male present for CPE Encounter for preventive health examination Patient was encouraged to exercise greater than 150 minutes a week. Patient was encouraged to choose a diet filled with fresh fruits and vegetables, and lean meats. AVS provided to patient today for education/recommendation on gender specific health and safety maintenance. Colonoscopy: No screening as of yet, ordered placed last year and he did not schedule, he is willing to try again.    Immunizations: tdap 12/15/2013, Flu 2015, PNA23 (2012 and 2017), meningococcal 2010-splenectomy : repeat meningococcal today last time able to get menveo 2/2 to age (we do not carry other Menatrca) Infectious disease screening: HIV and Hep C completed DEXA:Had been on prednisone long term at one time, consider early screen.  Prostate cancer screening - PSA Encounter for long-term current use of medication - Comp Met (CMET) Screening for diabetes mellitus - HgB A1c Screening for deficiency anemia - CBC w/Diff - halobetasol (ULTRAVATE) 0.05 % ointment; APPLY TO AFFECTED AREA TWICE A DAY  Dispense: 50 g; Refill: 3 Overweight (BMI 25.0-29.9) Diet and routine exercise.  - Lipid panel Colon cancer screening - Ambulatory referral to Gastroenterology Eczematous dermatitis of eyelid, unspecified laterality - halobetasol (ULTRAVATE) 0.05 % ointment; APPLY TO AFFECTED AREA TWICE A DAY  Dispense: 50 g; Refill: 3 ERECTILE DYSFUNCTION, MILD Refills on revatio today. H/O slenectomy/Asplenia - immunizations UTD.  - PNA23 every 5 years (due 2023) - menveo up until 26 only (last 2018)--> then ? Slow transit constipation - probiotics and miralax daily discussed.  - TSH   No follow-ups on file.  Note is dictated utilizing voice recognition software. Although note has been proof read prior to signing, occasional  typographical errors still can be missed. If any questions arise, please do not hesitate to call for verification.  Electronically signed by: Howard Pouch, DO [place]

## 2018-04-20 NOTE — Patient Instructions (Addendum)
Try probiotics (ALIGN) daily.  Miralax 1/1-1 cap a day in 8 ounces of water until stools are normal.   Health Maintenance, Male A healthy lifestyle and preventive care is important for your health and wellness. Ask your health care provider about what schedule of regular examinations is right for you. What should I know about weight and diet? Eat a Healthy Diet  Eat plenty of vegetables, fruits, whole grains, low-fat dairy products, and lean protein.  Do not eat a lot of foods high in solid fats, added sugars, or salt.  Maintain a Healthy Weight Regular exercise can help you achieve or maintain a healthy weight. You should:  Do at least 150 minutes of exercise each week. The exercise should increase your heart rate and make you sweat (moderate-intensity exercise).  Do strength-training exercises at least twice a week.  Watch Your Levels of Cholesterol and Blood Lipids  Have your blood tested for lipids and cholesterol every 5 years starting at 55 years of age. If you are at high risk for heart disease, you should start having your blood tested when you are 55 years old. You may need to have your cholesterol levels checked more often if: ? Your lipid or cholesterol levels are high. ? You are older than 55 years of age. ? You are at high risk for heart disease.  What should I know about cancer screening? Many types of cancers can be detected early and may often be prevented. Lung Cancer  You should be screened every year for lung cancer if: ? You are a current smoker who has smoked for at least 30 years. ? You are a former smoker who has quit within the past 15 years.  Talk to your health care provider about your screening options, when you should start screening, and how often you should be screened.  Colorectal Cancer  Routine colorectal cancer screening usually begins at 55 years of age and should be repeated every 5-10 years until you are 55 years old. You may need to be  screened more often if early forms of precancerous polyps or small growths are found. Your health care provider may recommend screening at an earlier age if you have risk factors for colon cancer.  Your health care provider may recommend using home test kits to check for hidden blood in the stool.  A small camera at the end of a tube can be used to examine your colon (sigmoidoscopy or colonoscopy). This checks for the earliest forms of colorectal cancer.  Prostate and Testicular Cancer  Depending on your age and overall health, your health care provider may do certain tests to screen for prostate and testicular cancer.  Talk to your health care provider about any symptoms or concerns you have about testicular or prostate cancer.  Skin Cancer  Check your skin from head to toe regularly.  Tell your health care provider about any new moles or changes in moles, especially if: ? There is a change in a mole's size, shape, or color. ? You have a mole that is larger than a pencil eraser.  Always use sunscreen. Apply sunscreen liberally and repeat throughout the day.  Protect yourself by wearing long sleeves, pants, a wide-brimmed hat, and sunglasses when outside.  What should I know about heart disease, diabetes, and high blood pressure?  If you are 76-21 years of age, have your blood pressure checked every 3-5 years. If you are 43 years of age or older, have your blood pressure checked  every year. You should have your blood pressure measured twice-once when you are at a hospital or clinic, and once when you are not at a hospital or clinic. Record the average of the two measurements. To check your blood pressure when you are not at a hospital or clinic, you can use: ? An automated blood pressure machine at a pharmacy. ? A home blood pressure monitor.  Talk to your health care provider about your target blood pressure.  If you are between 40-29 years old, ask your health care provider if you  should take aspirin to prevent heart disease.  Have regular diabetes screenings by checking your fasting blood sugar level. ? If you are at a normal weight and have a low risk for diabetes, have this test once every three years after the age of 53. ? If you are overweight and have a high risk for diabetes, consider being tested at a younger age or more often.  A one-time screening for abdominal aortic aneurysm (AAA) by ultrasound is recommended for men aged 11-75 years who are current or former smokers. What should I know about preventing infection? Hepatitis B If you have a higher risk for hepatitis B, you should be screened for this virus. Talk with your health care provider to find out if you are at risk for hepatitis B infection. Hepatitis C Blood testing is recommended for:  Everyone born from [birth year] through 1963.  Anyone with known risk factors for hepatitis C.  Sexually Transmitted Diseases (STDs)  You should be screened each year for STDs including gonorrhea and chlamydia if: ? You are sexually active and are younger than 55 years of age. ? You are older than 55 years of age and your health care provider tells you that you are at risk for this type of infection. ? Your sexual activity has changed since you were last screened and you are at an increased risk for chlamydia or gonorrhea. Ask your health care provider if you are at risk.  Talk with your health care provider about whether you are at high risk of being infected with HIV. Your health care provider may recommend a prescription medicine to help prevent HIV infection.  What else can I do?  Schedule regular health, dental, and eye exams.  Stay current with your vaccines (immunizations).  Do not use any tobacco products, such as cigarettes, chewing tobacco, and e-cigarettes. If you need help quitting, ask your health care provider.  Limit alcohol intake to no more than 2 drinks per day. One drink equals 12 ounces of beer,  5 ounces of wine, or 1 ounces of hard liquor.  Do not use street drugs.  Do not share needles.  Ask your health care provider for help if you need support or information about quitting drugs.  Tell your health care provider if you often feel depressed.  Tell your health care provider if you have ever been abused or do not feel safe at home. This information is not intended to replace advice given to you by your health care provider. Make sure you discuss any questions you have with your health care provider. Document Released: 05/29/2008 Document Revised: 07/30/2016 Document Reviewed: 09/04/2015 Elsevier Interactive Patient Education  [employer].

## 2018-05-17 ENCOUNTER — Telehealth: Payer: Self-pay | Admitting: Family Medicine

## 2018-05-17 NOTE — Telephone Encounter (Signed)
The referral was already placed.  If it is still open. It can go to whoever he desires

## 2018-05-17 NOTE — Telephone Encounter (Signed)
Copied from [place] [id number]. Topic: Referral - Request >> May 17, 2018 11:58 AM Boyd Kerbs wrote: Reason for CRM: .  Pt. Is requesting referral for Dr. Carlean Purl - GI at [facility] - regarding stomach issues.  He spoke to Dr. Raoul Pitch about this the first of May

## 2018-05-17 NOTE — Telephone Encounter (Signed)
Called patient back & gave him the phone # to schedule appt

## 2018-05-19 ENCOUNTER — Emergency Department (HOSPITAL_BASED_OUTPATIENT_CLINIC_OR_DEPARTMENT_OTHER): Payer: 59

## 2018-05-19 ENCOUNTER — Emergency Department (HOSPITAL_BASED_OUTPATIENT_CLINIC_OR_DEPARTMENT_OTHER)
Admission: EM | Admit: 2018-05-19 | Discharge: 2018-05-19 | Disposition: A | Discharge status: Home / Self Care | Payer: 59 | Attending: Emergency Medicine | Admitting: Emergency Medicine

## 2018-05-19 ENCOUNTER — Telehealth: Payer: Self-pay | Admitting: Gastroenterology

## 2018-05-19 ENCOUNTER — Other Ambulatory Visit: Payer: Self-pay

## 2018-05-19 ENCOUNTER — Encounter (HOSPITAL_BASED_OUTPATIENT_CLINIC_OR_DEPARTMENT_OTHER): Payer: Self-pay | Admitting: *Deleted

## 2018-05-19 DIAGNOSIS — R1900 Intra-abdominal and pelvic swelling, mass and lump, unspecified site: Secondary | ICD-10-CM | POA: Diagnosis not present

## 2018-05-19 DIAGNOSIS — Z79899 Other long term (current) drug therapy: Secondary | ICD-10-CM | POA: Insufficient documentation

## 2018-05-19 DIAGNOSIS — Z91040 Latex allergy status: Secondary | ICD-10-CM | POA: Insufficient documentation

## 2018-05-19 DIAGNOSIS — Z87891 Personal history of nicotine dependence: Secondary | ICD-10-CM | POA: Diagnosis not present

## 2018-05-19 DIAGNOSIS — R1030 Lower abdominal pain, unspecified: Secondary | ICD-10-CM | POA: Diagnosis present

## 2018-05-19 DIAGNOSIS — Z9049 Acquired absence of other specified parts of digestive tract: Secondary | ICD-10-CM | POA: Insufficient documentation

## 2018-05-19 DIAGNOSIS — R112 Nausea with vomiting, unspecified: Secondary | ICD-10-CM | POA: Diagnosis not present

## 2018-05-19 DIAGNOSIS — Z85828 Personal history of other malignant neoplasm of skin: Secondary | ICD-10-CM | POA: Diagnosis not present

## 2018-05-19 LAB — CBC WITH DIFFERENTIAL/PLATELET
BASOS ABS: 0 10*3/uL (ref 0.0–0.1)
Basophils Relative: 0 %
EOS PCT: 0 %
Eosinophils Absolute: 0 10*3/uL (ref 0.0–0.7)
HEMATOCRIT: 42.9 % (ref 39.0–52.0)
Hemoglobin: 14.7 g/dL (ref 13.0–17.0)
LYMPHS ABS: 0.9 10*3/uL (ref 0.7–4.0)
LYMPHS PCT: 7 %
MCH: 30.6 pg (ref 26.0–34.0)
MCHC: 34.3 g/dL (ref 30.0–36.0)
MCV: 89.4 fL (ref 78.0–100.0)
MONOS PCT: 8 %
Monocytes Absolute: 0.9 10*3/uL (ref 0.1–1.0)
Neutro Abs: 10.3 10*3/uL — ABNORMAL HIGH (ref 1.7–7.7)
Neutrophils Relative %: 85 %
PLATELETS: 378 10*3/uL (ref 150–400)
RBC: 4.8 MIL/uL (ref 4.22–5.81)
RDW: 13 % (ref 11.5–15.5)
WBC: 12.2 10*3/uL — ABNORMAL HIGH (ref 4.0–10.5)

## 2018-05-19 LAB — URINALYSIS, ROUTINE W REFLEX MICROSCOPIC
Bilirubin Urine: NEGATIVE
GLUCOSE, UA: NEGATIVE mg/dL
HGB URINE DIPSTICK: NEGATIVE
KETONES UR: 15 mg/dL — AB
LEUKOCYTES UA: NEGATIVE
Nitrite: NEGATIVE
PH: 7 (ref 5.0–8.0)
Protein, ur: NEGATIVE mg/dL
Specific Gravity, Urine: <1.005 — ABNORMAL LOW (ref 1.005–1.030)

## 2018-05-19 LAB — COMPREHENSIVE METABOLIC PANEL
ALBUMIN: 4.2 g/dL (ref 3.5–5.0)
ALT: 48 U/L (ref 17–63)
ANION GAP: 11 (ref 5–15)
AST: 49 U/L — AB (ref 15–41)
Alkaline Phosphatase: 123 U/L (ref 38–126)
BILIRUBIN TOTAL: 1.4 mg/dL — AB (ref 0.3–1.2)
BUN: 15 mg/dL (ref 6–20)
CHLORIDE: 100 mmol/L — AB (ref 101–111)
CO2: 28 mmol/L (ref 22–32)
Calcium: 8.9 mg/dL (ref 8.9–10.3)
Creatinine, Ser: 0.92 mg/dL (ref 0.61–1.24)
GFR calc Af Amer: >60 mL/min (ref >60)
GFR calc non Af Amer: >60 mL/min (ref >60)
GLUCOSE: 131 mg/dL — AB (ref 65–99)
Potassium: 3.9 mmol/L (ref 3.5–5.1)
SODIUM: 139 mmol/L (ref 135–145)
TOTAL PROTEIN: 8 g/dL (ref 6.5–8.1)

## 2018-05-19 LAB — LIPASE, BLOOD: Lipase: 37 U/L (ref 11–51)

## 2018-05-19 MED ORDER — IOPAMIDOL (ISOVUE-300) INJECTION 61%
30.0000 mL | Freq: Once | INTRAVENOUS | Status: AC | PRN
  Administered 2018-05-19: 15 mL via ORAL

## 2018-05-19 MED ORDER — ONDANSETRON HCL 4 MG/2ML IJ SOLN
4.0000 mg | Freq: Once | INTRAMUSCULAR | Status: AC
  Administered 2018-05-19: 4 mg via INTRAVENOUS
  Filled 2018-05-19: qty 2

## 2018-05-19 MED ORDER — ONDANSETRON 4 MG PO TBDP: 4.0000 mg | ORAL_TABLET | Freq: Three times a day (TID) | ORAL | 0 refills | Status: DC | PRN

## 2018-05-19 MED ORDER — IOPAMIDOL (ISOVUE-300) INJECTION 61%
100.0000 mL | Freq: Once | INTRAVENOUS | Status: AC | PRN
  Administered 2018-05-19: 100 mL via INTRAVENOUS
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
## 2018-05-19 NOTE — Discharge Instructions (Addendum)
You had a CT scan of your abdomen today that showed multiple masses concerning for cancer. It is very important that you follow up with gastroenterology. Get rechecked immediately if you vomit blood, have severe pain or new concerning symptoms.

## 2018-05-19 NOTE — ED Triage Notes (Signed)
Pt has had abdominal pain for months, last night he started vomiting dark brown coffee ground type vomit.

## 2018-05-19 NOTE — Telephone Encounter (Signed)
Patient states he has to take care of some things today and will try to be admitted tomorrow 05/20/18 at [facility]. States he was told to coordinate with [place] to be admitted at [facility]. Best call back # [phone]

## 2018-05-19 NOTE — Telephone Encounter (Signed)
Amy,    This patient is scheduled to see you on 6/19 for abdominal pain.  He came to [facility] ED with pain and vomiting.  He was concerned there may have been blood in vomitus, ED said no vomiting in ED and photo patient brought did not appear to be blood. Hgb  and renal function normal.  CT shows findings worrisome for malignancy, possibly from distal pancreas with adjacent gastric wall thickening, peritoneal changes and mild ascites.    I asked ED doc Ralene Bathe to offer patient admission, but he declined.  Please see this patient in clinic ASAP and plan EGD, EUS or both as seems appropriate.

## 2018-05-19 NOTE — Telephone Encounter (Signed)
-----   Message from Quintella Reichert, MD sent at 05/19/2018  2:51 PM EDT ----- Regarding: ED patient follow up Re ED patient with abdominal mass concerning for pancreatic cancer - he declined admission and will follow up in the office.

## 2018-05-20 NOTE — Telephone Encounter (Signed)
Contacted the patient. Agrees to 05/26/18 at 3 pm arrive at 2:45 pm. Confirmed the address for him also.

## 2018-05-20 NOTE — Telephone Encounter (Signed)
It's all fine.  Please get him in sooner than the 6/19 appointment with any APP or doc.

## 2018-05-20 NOTE — Telephone Encounter (Signed)
Spoke with the patient. He understands he is to come to the already scheduled appointment. He will go to the ED if the vomiting recurs, but he is not to report to the ED with an expectation of GI directly admitting.  He was very apologetic for declining to take the advise of the ED provider. 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 His personal obligations made him feel he could not follow the recommendations at the time.

## 2018-05-20 NOTE — Telephone Encounter (Signed)
There is some misunderstanding, since my advice to ED doc did not include a delayed admission to the hospital through our office. The patient needs to be seen in clinic by AE (with whom he was originally scheduled) this week or early next week.  If AE is out of office in that time frame, any APP or doc is fine.

## 2018-05-24 ENCOUNTER — Encounter: Payer: Self-pay | Admitting: Family Medicine

## 2018-05-24 ENCOUNTER — Telehealth: Payer: Self-pay

## 2018-05-24 ENCOUNTER — Ambulatory Visit: Payer: 59 | Admitting: Family Medicine

## 2018-05-24 VITALS — BP 119/79 | HR 77 | Temp 98.7°F | Resp 20 | Ht 71.5 in | Wt 195.0 lb

## 2018-05-24 DIAGNOSIS — K869 Disease of pancreas, unspecified: Secondary | ICD-10-CM | POA: Diagnosis not present

## 2018-05-24 DIAGNOSIS — K8689 Other specified diseases of pancreas: Secondary | ICD-10-CM | POA: Insufficient documentation

## 2018-05-24 DIAGNOSIS — K319 Disease of stomach and duodenum, unspecified: Secondary | ICD-10-CM | POA: Diagnosis not present

## 2018-05-24 DIAGNOSIS — R1084 Generalized abdominal pain: Secondary | ICD-10-CM | POA: Diagnosis not present

## 2018-05-24 DIAGNOSIS — K3189 Other diseases of stomach and duodenum: Secondary | ICD-10-CM

## 2018-05-24 MED ORDER — OXYCODONE HCL 5 MG PO TABS: 5.0000 mg | ORAL_TABLET | Freq: Four times a day (QID) | ORAL | 0 refills | Status: DC | PRN

## 2018-05-24 MED ORDER — ONDANSETRON 4 MG PO TBDP: 4.0000 mg | ORAL_TABLET | Freq: Three times a day (TID) | ORAL | 1 refills | Status: DC | PRN

## 2018-05-24 NOTE — Patient Instructions (Addendum)
I have called in oxycodone plain (no tylenol).  Start miralax 1 cap every 12 hours with 8 ounces of water each time. Once BM occurs can back down to once daily.   Please go to ED if pain worsening or you do not have a BM in 2 days. Especially if you stop passing gas, then go immediately.   Please have your GI send [place] the records so we can follow along.      t
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
## 2018-05-24 NOTE — Telephone Encounter (Addendum)
Patient "walk in" with complaint of abdominal pain, worsening x 1 week. Patient has appointment with GI on 05/26/18. Patient unable to sleep d/t pain, requesting prescription for sleep aid and/or pain medication. Recent ER visit 05/19/18 for abdominal pain/nausea/vomiting, "Abdominal CT does demonstrate multiple masses concerning for metastatic cancer, possible pancreatic versus gastric". Family H/O pancreatic cancer (maternal/paternal grandfathers). Patient was unable to stay for admission during ER visit d/t other obligations at that time. Patient would like to discuss findings with PCP, scheduled for appointment today (05/24/18) at 3:15pm. Advised to go back to ER with severe pain, verbalized understanding.   BP 130/80

## 2018-05-26 ENCOUNTER — Ambulatory Visit: Payer: 59 | Admitting: Physician Assistant

## 2018-05-26 ENCOUNTER — Other Ambulatory Visit (INDEPENDENT_AMBULATORY_CARE_PROVIDER_SITE_OTHER): Payer: 59

## 2018-05-26 ENCOUNTER — Encounter: Payer: Self-pay | Admitting: Physician Assistant

## 2018-05-26 VITALS — BP 124/80 | HR 76 | Ht 71.0 in | Wt 197.0 lb

## 2018-05-26 DIAGNOSIS — R634 Abnormal weight loss: Secondary | ICD-10-CM

## 2018-05-26 DIAGNOSIS — R1013 Epigastric pain: Secondary | ICD-10-CM

## 2018-05-26 DIAGNOSIS — R110 Nausea: Secondary | ICD-10-CM

## 2018-05-26 DIAGNOSIS — R935 Abnormal findings on diagnostic imaging of other abdominal regions, including retroperitoneum: Secondary | ICD-10-CM

## 2018-05-26 LAB — COMPREHENSIVE METABOLIC PANEL
ALBUMIN: 4.4 g/dL (ref 3.5–5.2)
ALK PHOS: 418 U/L — AB (ref 39–117)
ALT: 296 U/L — ABNORMAL HIGH (ref 0–53)
AST: 245 U/L — ABNORMAL HIGH (ref 0–37)
BILIRUBIN TOTAL: 8.3 mg/dL — AB (ref 0.2–1.2)
BUN: 10 mg/dL (ref 6–23)
CALCIUM: 9.8 mg/dL (ref 8.4–10.5)
CHLORIDE: 92 meq/L — AB (ref 96–112)
CO2: 34 mEq/L — ABNORMAL HIGH (ref 19–32)
Creatinine, Ser: 0.94 mg/dL (ref 0.40–1.50)
GFR: 88.69 mL/min (ref >60.00)
Glucose, Bld: 121 mg/dL — ABNORMAL HIGH (ref 70–99)
Potassium: 4 mEq/L (ref 3.5–5.1)
Sodium: 135 mEq/L (ref 135–145)
TOTAL PROTEIN: 8.1 g/dL (ref 6.0–8.3)

## 2018-05-26 LAB — CBC WITH DIFFERENTIAL/PLATELET
BASOS ABS: 0 10*3/uL (ref 0.0–0.1)
BASOS PCT: 0.4 % (ref 0.0–3.0)
EOS ABS: 0.1 10*3/uL (ref 0.0–0.7)
Eosinophils Relative: 1.6 % (ref 0.0–5.0)
HEMATOCRIT: 41.7 % (ref 39.0–52.0)
HEMOGLOBIN: 14.2 g/dL (ref 13.0–17.0)
LYMPHS PCT: 15.1 % (ref 12.0–46.0)
Lymphs Abs: 1.3 10*3/uL (ref 0.7–4.0)
MCHC: 34.1 g/dL (ref 30.0–36.0)
MCV: 90 fl (ref 78.0–100.0)
Monocytes Absolute: 1.3 10*3/uL — ABNORMAL HIGH (ref 0.1–1.0)
Monocytes Relative: 15.3 % — ABNORMAL HIGH (ref 3.0–12.0)
Neutro Abs: 5.7 10*3/uL (ref 1.4–7.7)
Neutrophils Relative %: 67.6 % (ref 43.0–77.0)
Platelets: 383 10*3/uL (ref 150.0–400.0)
RBC: 4.63 Mil/uL (ref 4.22–5.81)
RDW: 13 % (ref 11.5–15.5)
WBC: 8.5 10*3/uL (ref 4.0–10.5)

## 2018-05-26 LAB — LIPASE: Lipase: 30 U/L (ref 11.0–59.0)

## 2018-05-26 LAB — PROTIME-INR
INR: 1.2 ratio — AB (ref 0.8–1.0)
Prothrombin Time: 13.9 s — ABNORMAL HIGH (ref 9.6–13.1)

## 2018-05-26 MED ORDER — ONDANSETRON 4 MG PO TBDP: 4.0000 mg | ORAL_TABLET | Freq: Three times a day (TID) | ORAL | 1 refills | Status: DC | PRN

## 2018-05-26 NOTE — Patient Instructions (Signed)
Continue the Zofran 4 mg ODT every 8 hours.  Continue Oxycodone every 6 hours as needed for pain.   Eat frequent small meals, protein shakes, and soups, yogart and soft foods.  We will call you with lab results.   You have been scheduled for an endoscopy. Please follow written instructions given to you at your visit today. If you use inhalers (even only as needed), please bring them with you on the day of your procedure.  If you are age 55 or younger, your body mass index should be between 19-25. Your Body mass index is 27.48 kg/m. If this is out of the aformentioned range listed, please consider follow up with your Primary Care Provider.
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
## 2018-05-27 ENCOUNTER — Other Ambulatory Visit: Payer: Self-pay

## 2018-05-27 ENCOUNTER — Inpatient Hospital Stay (HOSPITAL_COMMUNITY): Payer: 59

## 2018-05-27 ENCOUNTER — Encounter (HOSPITAL_COMMUNITY): Payer: Self-pay

## 2018-05-27 ENCOUNTER — Inpatient Hospital Stay (HOSPITAL_COMMUNITY)
Admission: AD | Admit: 2018-05-27 | Discharge: 2018-06-04 | DRG: 981 | Disposition: A | Discharge status: Home / Self Care | Payer: 59 | Attending: Internal Medicine | Admitting: Internal Medicine

## 2018-05-27 DIAGNOSIS — R197 Diarrhea, unspecified: Secondary | ICD-10-CM | POA: Diagnosis not present

## 2018-05-27 DIAGNOSIS — K8689 Other specified diseases of pancreas: Secondary | ICD-10-CM

## 2018-05-27 DIAGNOSIS — J309 Allergic rhinitis, unspecified: Secondary | ICD-10-CM | POA: Diagnosis present

## 2018-05-27 DIAGNOSIS — C799 Secondary malignant neoplasm of unspecified site: Secondary | ICD-10-CM | POA: Diagnosis not present

## 2018-05-27 DIAGNOSIS — R188 Other ascites: Secondary | ICD-10-CM | POA: Diagnosis present

## 2018-05-27 DIAGNOSIS — R7989 Other specified abnormal findings of blood chemistry: Secondary | ICD-10-CM

## 2018-05-27 DIAGNOSIS — R933 Abnormal findings on diagnostic imaging of other parts of digestive tract: Secondary | ICD-10-CM

## 2018-05-27 DIAGNOSIS — C169 Malignant neoplasm of stomach, unspecified: Secondary | ICD-10-CM | POA: Diagnosis present

## 2018-05-27 DIAGNOSIS — G4700 Insomnia, unspecified: Secondary | ICD-10-CM | POA: Diagnosis present

## 2018-05-27 DIAGNOSIS — Z91030 Bee allergy status: Secondary | ICD-10-CM | POA: Diagnosis not present

## 2018-05-27 DIAGNOSIS — C259 Malignant neoplasm of pancreas, unspecified: Secondary | ICD-10-CM | POA: Diagnosis present

## 2018-05-27 DIAGNOSIS — Z7189 Other specified counseling: Secondary | ICD-10-CM

## 2018-05-27 DIAGNOSIS — K838 Other specified diseases of biliary tract: Secondary | ICD-10-CM

## 2018-05-27 DIAGNOSIS — R748 Abnormal levels of other serum enzymes: Secondary | ICD-10-CM

## 2018-05-27 DIAGNOSIS — Z91040 Latex allergy status: Secondary | ICD-10-CM | POA: Diagnosis not present

## 2018-05-27 DIAGNOSIS — Z91048 Other nonmedicinal substance allergy status: Secondary | ICD-10-CM | POA: Diagnosis not present

## 2018-05-27 DIAGNOSIS — K831 Obstruction of bile duct: Secondary | ICD-10-CM | POA: Diagnosis present

## 2018-05-27 DIAGNOSIS — R945 Abnormal results of liver function studies: Secondary | ICD-10-CM | POA: Diagnosis not present

## 2018-05-27 DIAGNOSIS — C762 Malignant neoplasm of abdomen: Secondary | ICD-10-CM | POA: Diagnosis not present

## 2018-05-27 DIAGNOSIS — Z881 Allergy status to other antibiotic agents status: Secondary | ICD-10-CM

## 2018-05-27 DIAGNOSIS — K219 Gastro-esophageal reflux disease without esophagitis: Secondary | ICD-10-CM | POA: Diagnosis present

## 2018-05-27 DIAGNOSIS — C801 Malignant (primary) neoplasm, unspecified: Secondary | ICD-10-CM

## 2018-05-27 DIAGNOSIS — Z85820 Personal history of malignant melanoma of skin: Secondary | ICD-10-CM

## 2018-05-27 DIAGNOSIS — K5909 Other constipation: Secondary | ICD-10-CM | POA: Diagnosis present

## 2018-05-27 DIAGNOSIS — C786 Secondary malignant neoplasm of retroperitoneum and peritoneum: Secondary | ICD-10-CM | POA: Diagnosis present

## 2018-05-27 DIAGNOSIS — C800 Disseminated malignant neoplasm, unspecified: Secondary | ICD-10-CM | POA: Diagnosis present

## 2018-05-27 DIAGNOSIS — B3781 Candidal esophagitis: Secondary | ICD-10-CM | POA: Diagnosis present

## 2018-05-27 DIAGNOSIS — K3189 Other diseases of stomach and duodenum: Secondary | ICD-10-CM

## 2018-05-27 DIAGNOSIS — Z87891 Personal history of nicotine dependence: Secondary | ICD-10-CM | POA: Diagnosis not present

## 2018-05-27 DIAGNOSIS — Q8901 Asplenia (congenital): Secondary | ICD-10-CM

## 2018-05-27 DIAGNOSIS — K869 Disease of pancreas, unspecified: Secondary | ICD-10-CM | POA: Diagnosis not present

## 2018-05-27 DIAGNOSIS — R17 Unspecified jaundice: Secondary | ICD-10-CM | POA: Diagnosis not present

## 2018-05-27 DIAGNOSIS — Z79899 Other long term (current) drug therapy: Secondary | ICD-10-CM | POA: Diagnosis not present

## 2018-05-27 DIAGNOSIS — Z9081 Acquired absence of spleen: Secondary | ICD-10-CM | POA: Diagnosis not present

## 2018-05-27 DIAGNOSIS — R0609 Other forms of dyspnea: Secondary | ICD-10-CM | POA: Diagnosis not present

## 2018-05-27 HISTORY — DX: Secondary malignant neoplasm of unspecified site: C79.9

## 2018-05-27 HISTORY — DX: Malignant neoplasm of stomach, unspecified: C16.9

## 2018-05-27 LAB — CBC WITH DIFFERENTIAL/PLATELET
BASOS ABS: 0 10*3/uL (ref 0.0–0.1)
Basophils Relative: 0 %
EOS ABS: 0.2 10*3/uL (ref 0.0–0.7)
EOS PCT: 2 %
HCT: 38.9 % — ABNORMAL LOW (ref 39.0–52.0)
Hemoglobin: 13.1 g/dL (ref 13.0–17.0)
LYMPHS ABS: 1 10*3/uL (ref 0.7–4.0)
Lymphocytes Relative: 10 %
MCH: 30.9 pg (ref 26.0–34.0)
MCHC: 33.7 g/dL (ref 30.0–36.0)
MCV: 91.7 fL (ref 78.0–100.0)
Monocytes Absolute: 1.6 10*3/uL — ABNORMAL HIGH (ref 0.1–1.0)
Monocytes Relative: 17 %
Neutro Abs: 6.4 10*3/uL (ref 1.7–7.7)
Neutrophils Relative %: 71 %
PLATELETS: 406 10*3/uL — AB (ref 150–400)
RBC: 4.24 MIL/uL (ref 4.22–5.81)
RDW: 13.2 % (ref 11.5–15.5)
WBC: 9.1 10*3/uL (ref 4.0–10.5)

## 2018-05-27 LAB — COMPREHENSIVE METABOLIC PANEL
ALK PHOS: 354 U/L — AB (ref 38–126)
ALT: 263 U/L — AB (ref 17–63)
AST: 222 U/L — AB (ref 15–41)
Albumin: 3.5 g/dL (ref 3.5–5.0)
Anion gap: 11 (ref 5–15)
BILIRUBIN TOTAL: 9.8 mg/dL — AB (ref 0.3–1.2)
BUN: 9 mg/dL (ref 6–20)
CALCIUM: 8.8 mg/dL — AB (ref 8.9–10.3)
CO2: 32 mmol/L (ref 22–32)
CREATININE: 0.74 mg/dL (ref 0.61–1.24)
Chloride: 94 mmol/L — ABNORMAL LOW (ref 101–111)
GFR calc Af Amer: >60 mL/min (ref >60)
GFR calc non Af Amer: >60 mL/min (ref >60)
Glucose, Bld: 117 mg/dL — ABNORMAL HIGH (ref 65–99)
Potassium: 3.9 mmol/L (ref 3.5–5.1)
Sodium: 137 mmol/L (ref 135–145)
TOTAL PROTEIN: 7.5 g/dL (ref 6.5–8.1)

## 2018-05-27 LAB — LIPASE, BLOOD: LIPASE: 32 U/L (ref 11–51)

## 2018-05-27 MED ORDER — ENOXAPARIN SODIUM 40 MG/0.4ML ~~LOC~~ SOLN
40.0000 mg | SUBCUTANEOUS | Status: AC
  Administered 2018-05-27 – 2018-05-29 (×3): 40 mg via SUBCUTANEOUS
  Filled 2018-05-27 (×3): qty 0.4

## 2018-05-27 MED ORDER — IOPAMIDOL (ISOVUE-300) INJECTION 61%
INTRAVENOUS | Status: AC
  Administered 2018-05-27: 15:00:00
  Filled 2018-05-27: qty 30

## 2018-05-27 MED ORDER — PANTOPRAZOLE SODIUM 40 MG PO TBEC: 40.0000 mg | DELAYED_RELEASE_TABLET | Freq: Every day | ORAL | Status: DC

## 2018-05-27 MED ORDER — OXYCODONE HCL 5 MG PO TABS
5.0000 mg | ORAL_TABLET | ORAL | Status: DC | PRN
  Administered 2018-05-28 – 2018-05-31 (×6): 5 mg via ORAL
  Filled 2018-05-27 (×6): qty 1

## 2018-05-27 MED ORDER — IOPAMIDOL (ISOVUE-300) INJECTION 61%
100.0000 mL | Freq: Once | INTRAVENOUS | Status: AC | PRN
  Administered 2018-05-27: 100 mL via INTRAVENOUS

## 2018-05-27 MED ORDER — ONDANSETRON HCL 4 MG PO TABS
4.0000 mg | ORAL_TABLET | Freq: Once | ORAL | Status: AC
  Administered 2018-05-27: 4 mg via ORAL
  Filled 2018-05-27: qty 1

## 2018-05-27 MED ORDER — ONDANSETRON HCL 4 MG/2ML IJ SOLN
4.0000 mg | Freq: Four times a day (QID) | INTRAMUSCULAR | Status: DC | PRN
  Administered 2018-05-27 – 2018-05-29 (×4): 4 mg via INTRAVENOUS
  Filled 2018-05-27 (×4): qty 2

## 2018-05-27 MED ORDER — IOPAMIDOL (ISOVUE-300) INJECTION 61%
INTRAVENOUS | Status: AC
  Administered 2018-05-27: 19:00:00
  Filled 2018-05-27: qty 100

## 2018-05-27 MED ORDER — SODIUM CHLORIDE 0.9 % IV SOLN
INTRAVENOUS | Status: DC
  Administered 2018-05-27 – 2018-05-29 (×2): via INTRAVENOUS
  Administered 2018-05-30: 1000 mL via INTRAVENOUS
  Administered 2018-05-31: 03:00:00 via INTRAVENOUS
  Administered 2018-05-31: 1000 mL via INTRAVENOUS
  Administered 2018-06-01 (×2): via INTRAVENOUS

## 2018-05-27 MED ORDER — MORPHINE SULFATE (PF) 2 MG/ML IV SOLN
2.0000 mg | INTRAVENOUS | Status: DC | PRN
  Administered 2018-05-27 – 2018-06-02 (×17): 2 mg via INTRAVENOUS
  Filled 2018-05-27 (×19): qty 1

## 2018-05-27 MED ORDER — POLYETHYLENE GLYCOL 3350 17 G PO PACK
17.0000 g | PACK | Freq: Every day | ORAL | Status: DC
  Administered 2018-05-28 – 2018-05-29 (×2): 17 g via ORAL
  Filled 2018-05-27 (×2): qty 1

## 2018-05-27 MED ORDER — IOPAMIDOL (ISOVUE-300) INJECTION 61%
15.0000 mL | Freq: Two times a day (BID) | INTRAVENOUS | Status: DC | PRN
  Administered 2018-05-27: 30 mL via ORAL
  Filled 2018-05-27: qty 30

## 2018-05-27 NOTE — Progress Notes (Signed)
His LFTs are now significantly elevated. I am not sure why, the pancreatic lesion is in body/tail and it'd be unusual for this to cause biliary obstruction. He needs to be admitted to the hospital to expedite his workup, care.
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
## 2018-05-27 NOTE — H&P (View-Only) (Signed)
Referring Provider:  EDP Primary Care Physician:  Ma Hillock, DO Primary Gastroenterologist:  Dr. Ardis Hughs  Reason for Consultation:  Abnormal CT scan and elevated LFT's  HPI: [name] is a 57 y.o. male who was new to our office yesterday and was seen by one of our PA's as a follow-up from a recent ER visit.  Apparently he has been feeling poorly for about 6 weeks.  Having abdominal fullness, bloating, and early satiety.  Recent CT scan showed the following:  IMPRESSION: 3.3 cm masslike soft tissue density in the pancreatic tail with proximal pancreatic ductal dilatation. This could be due to primary pancreatic carcinoma or metastatic disease involving the pancreas.  Bulky masslike soft tissue density along the lateral wall the stomach, and in the adjacent gastrosplenic ligament. This is suspicious for metastatic disease, although primary gastric carcinoma cannot be excluded. Consider endoscopy and [place] for further evaluation.  Mild ascites with multiloculated fluid collections in the upper abdomen. Peritoneal and omental soft tissue thickening and nodularity, highly suspicious for peritoneal carcinomatosis.  Labs were drawn at our office yesterday and LFT's markedly abnormal with total bili 8.3, AST 245, ALT 296, and ALP 418.    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   Past Medical History:  Diagnosis Date  . Allergy   . Arthritis   . Asplenia   . Basal cell carcinoma   . Erectile dysfunction   . Facial fracture ([place])   . GERD (gastroesophageal reflux disease)   . History of chicken pox   . Melanoma of thigh ([place])    left  . Squamous cell carcinoma, arm    left    Past Surgical History:  Procedure Laterality Date  . CHOLECYSTECTOMY   2009  . MANDIBLE SURGERY    . MELANOMA EXCISION WITH SENTINEL LYMPH NODE BIOPSY  1999  . NASAL SINUS SURGERY    . ORIF ORBITAL FRACTURE    . PALATE SURGERY    . right arm  09/14/08   x2 plates/screws  . SPLENECTOMY, TOTAL  2008   trauma  . zygomtic Left 2015   fracture/plate-screw    Prior to Admission medications   Medication Sig Start Date End Date Taking? Authorizing Provider  cetirizine (ZYRTEC) 10 MG tablet Take 10 mg by mouth daily.      [provider]  Cyanocobalamin (VITAMIN B 12 PO) Take 1,000 mcg by mouth daily.    [provider]  esomeprazole (NEXIUM) 20 MG capsule TAKE ONE CAPSULE BY MOUTH EVERY DAY 04/10/15   Dorena Cookey, MD  flintstones complete (FLINTSTONES) 60 MG chewable tablet Chew 1 tablet by mouth daily.    [provider]  halobetasol (ULTRAVATE) 0.05 % ointment APPLY TO AFFECTED AREA TWICE A DAY 04/20/18   Kuneff, Renee A, DO  ondansetron (ZOFRAN ODT) 4 MG disintegrating tablet Take 1 tablet (4 mg total) by mouth every 8 (eight) hours as needed for nausea or vomiting. 05/26/18   Esterwood, Amy S, PA-C  oxyCODONE (OXY IR/ROXICODONE) 5 MG immediate release tablet Take 1 tablet (5 mg total) by mouth every 6 (six) hours as needed for severe pain. 05/24/18   Kuneff, Renee A, DO  polyethylene glycol (MIRALAX / GLYCOLAX) packet Take 17 g by mouth  daily.    [provider]  Probiotic Product (ALIGN PO) Take 1 tablet by mouth daily.    [provider]  sildenafil (REVATIO) 20 MG tablet 1-5 tabs PO prior to sexually activity. 04/20/18   Kuneff, Renee A, DO    No current facility-administered medications for this encounter.     Allergies as of 05/27/2018 - Review Complete 05/27/2018  Allergen Reaction Noted  . Cephalexin Other (See Comments) 05/18/2007  . Other  07/25/2014  . Latex Rash 04/07/2014  . Tape Rash 04/07/2014    Family History  Problem Relation Age of Onset  . Arthritis Mother   . Heart disease Mother   .  Arthritis Father   . Hearing loss Father   . Pancreatic cancer Maternal Grandfather   . Pancreatic cancer Paternal Grandfather     Social History   Socioeconomic History  . Marital status: Divorced    Spouse name: Not on file  . Number of children: Not on file  . Years of education: Not on file  . Highest education level: Not on file  Occupational History  . Not on file  Social Needs  . Financial resource strain: Not on file  . Food insecurity:    Worry: Not on file    Inability: Not on file  . Transportation needs:    Medical: Not on file    Non-medical: Not on file  Tobacco Use  . Smoking status: Former Smoker    Packs/day: 1.00    Last attempt to quit: 12/07/2011    Years since quitting: 6.4  . Smokeless tobacco: Never Used  Substance and Sexual Activity  . Alcohol use: Yes  . Drug use: No  . Sexual activity: Yes    Birth control/protection: Condom  Lifestyle  . Physical activity:    Days per week: Not on file    Minutes per session: Not on file  . Stress: Not on file  Relationships  . Social connections:    Talks on phone: Not on file    Gets together: Not on file    Attends religious service: Not on file    Active member of club or organization: Not on file    Attends meetings of clubs or organizations: Not on file    Relationship status: Not on file  . Intimate partner violence:    Fear of current or ex partner: Not on file    Emotionally abused: Not on file    Physically abused: Not on file    Forced sexual activity: Not on file  Other Topics Concern  . Not on file  Social History Narrative   Divorced, 1 son (Rylan).   2 years of college.   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    Former smoker (quit 12/07/2011), no drug use, 1-2 drinks a week.   Drinks caffeine, takes a daily vitamin   Wears his seatbelt, smoke detector in the home, firearms in the home.    Exercises > 3x a day   Feels safe in relationships    Review of  Systems: ROS is O/W negative except as mentioned in HPI.  Physical Exam: Vital signs in last 24 hours: Temp:  [98.2 F (36.8 C)] 98.2 F (36.8 C) (06/13 1121) Pulse Rate:  [76-81] 81 (06/13 1121) Resp:  [15] 15 (06/13 1121) BP: (120-124)/(80-87) 120/87 (06/13 1121) SpO2:  [97 %] 97 % (06/13 1121) Weight:  [197 lb (89.4 kg)] 197 lb (89.4 kg) (06/12 1441)  General:  Alert, Well-developed, well-nourished, pleasant and cooperative in NAD; appears jaundice Head:  Normocephalic and atraumatic. Eyes:  Scleral icterus noted.  Ears:  Normal auditory acuity. Mouth:  No deformity or lesions.   Lungs:  Clear throughout to auscultation.   No wheezes, crackles, or rhonchi.  Heart:  Regular rate and rhythm; no murmurs, clicks, rubs, or gallops. Abdomen:  Soft, non-distended.  BS present.  Mild diffuse TTP. Msk:  Symmetrical without gross deformities. Pulses:  Normal pulses noted. Extremities:  Without clubbing or edema. Neurologic:  Alert and oriented x 4;  grossly normal neurologically. Skin:  Intact without significant lesions or rashes. Psych:  Alert and cooperative. Normal mood and affect.  Lab Results: Recent Labs    05/26/18 1548  WBC 8.5  HGB 14.2  HCT 41.7  PLT 383.0   BMET Recent Labs    05/26/18 1548  NA 135  K 4.0  CL 92*  CO2 34*  GLUCOSE 121*  BUN 10  CREATININE 0.94  CALCIUM 9.8   LFT Recent Labs    05/26/18 1548  PROT 8.1  ALBUMIN 4.4  AST 245*  ALT 296*  ALKPHOS 418*  BILITOT 8.3*   PT/INR Recent Labs    05/26/18 1548  LABPROT 13.9*  INR 1.2*   IMPRESSION:  *57 year old male with 6 weeks of bloating, fullness, early satiety, and abdominal discomfort that has been progressively worsening and now having a lot of nausea and vomiting at times, pain more severe.  Abnormal CT scan showing malignancy with suspect carcinomatosis but unknown primary.  Now LFT's markedly elevated.  Needs expedited evaluation.  PLAN: *Will repeat CT scan chest, abdomen,  and pelvis today. *Tentatively planning for EGD and possible ERCP on 6/14. *Pain control and antiemetics. *Monitor labs/trend LFT's.   Janett Billow D. Hommer Cunliffe  05/27/2018, 12:39 PM

## 2018-05-27 NOTE — Progress Notes (Signed)
57 yo male coming directly from [facility] with abnormal labs elevated liver function test bilirubin of 8.0 which is up from 1.4 less than a week ago, alkaline phosphatase 458 no known liver mets or dilatation of the duct.  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  Concern for mass in the pancreas or gastric carcinoma as primary.  Patient has a history of splenectomy.  Ascites noted by CT scan and a mass in the splenic bed.  Essentially patient is coming in for GI work-up because of rapid decline and abnormal labs.  Dr. Ardis Hughs will be seeing the patient for possible EGD tomorrow and repeat imaging of his CT of the abdomen pelvis with and without contrast assess the mass.  His creatinine is normal.  He has not seen an oncologist.  Might need IR consult for biopsy.

## 2018-05-27 NOTE — Consult Note (Signed)
Referring Provider:  EDP Primary Care Physician:  Ma Hillock, DO Primary Gastroenterologist:  Dr. Ardis Hughs  Reason for Consultation:  Abnormal CT scan and elevated LFT's  HPI: [name] is a 57 y.o. male who was new to our office yesterday and was seen by one of our PA's as a follow-up from a recent ER visit.  Apparently he has been feeling poorly for about 6 weeks.  Having abdominal fullness, bloating, and early satiety.  Recent CT scan showed the following:  IMPRESSION: 3.3 cm masslike soft tissue density in the pancreatic tail with proximal pancreatic ductal dilatation. This could be due to primary pancreatic carcinoma or metastatic disease involving the pancreas.  Bulky masslike soft tissue density along the lateral wall the stomach, and in the adjacent gastrosplenic ligament. This is suspicious for metastatic disease, although primary gastric carcinoma cannot be excluded. Consider endoscopy and [place] for further evaluation.  Mild ascites with multiloculated fluid collections in the upper abdomen. Peritoneal and omental soft tissue thickening and nodularity, highly suspicious for peritoneal carcinomatosis.  Labs were drawn at our office yesterday and LFT's markedly abnormal with total bili 8.3, AST 245, ALT 296, and ALP 418.    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   Past Medical History:  Diagnosis Date  . Allergy   . Arthritis   . Asplenia   . Basal cell carcinoma   . Erectile dysfunction   . Facial fracture ([place])   . GERD (gastroesophageal reflux disease)   . History of chicken pox   . Melanoma of thigh ([place])    left  . Squamous cell carcinoma, arm    left    Past Surgical History:  Procedure Laterality Date  . CHOLECYSTECTOMY   2009  . MANDIBLE SURGERY    . MELANOMA EXCISION WITH SENTINEL LYMPH NODE BIOPSY  1999  . NASAL SINUS SURGERY    . ORIF ORBITAL FRACTURE    . PALATE SURGERY    . right arm  09/14/08   x2 plates/screws  . SPLENECTOMY, TOTAL  2008   trauma  . zygomtic Left 2015   fracture/plate-screw    Prior to Admission medications   Medication Sig Start Date End Date Taking? Authorizing Provider  cetirizine (ZYRTEC) 10 MG tablet Take 10 mg by mouth daily.      [provider]  Cyanocobalamin (VITAMIN B 12 PO) Take 1,000 mcg by mouth daily.    [provider]  esomeprazole (NEXIUM) 20 MG capsule TAKE ONE CAPSULE BY MOUTH EVERY DAY 04/10/15   Dorena Cookey, MD  flintstones complete (FLINTSTONES) 60 MG chewable tablet Chew 1 tablet by mouth daily.    [provider]  halobetasol (ULTRAVATE) 0.05 % ointment APPLY TO AFFECTED AREA TWICE A DAY 04/20/18   Kuneff, Renee A, DO  ondansetron (ZOFRAN ODT) 4 MG disintegrating tablet Take 1 tablet (4 mg total) by mouth every 8 (eight) hours as needed for nausea or vomiting. 05/26/18   Esterwood, Amy S, PA-C  oxyCODONE (OXY IR/ROXICODONE) 5 MG immediate release tablet Take 1 tablet (5 mg total) by mouth every 6 (six) hours as needed for severe pain. 05/24/18   Kuneff, Renee A, DO  polyethylene glycol (MIRALAX / GLYCOLAX) packet Take 17 g by mouth  daily.    [provider]  Probiotic Product (ALIGN PO) Take 1 tablet by mouth daily.    [provider]  sildenafil (REVATIO) 20 MG tablet 1-5 tabs PO prior to sexually activity. 04/20/18   Kuneff, Renee A, DO    No current facility-administered medications for this encounter.     Allergies as of 05/27/2018 - Review Complete 05/27/2018  Allergen Reaction Noted  . Cephalexin Other (See Comments) 05/18/2007  . Other  07/25/2014  . Latex Rash 04/07/2014  . Tape Rash 04/07/2014    Family History  Problem Relation Age of Onset  . Arthritis Mother   . Heart disease Mother   .  Arthritis Father   . Hearing loss Father   . Pancreatic cancer Maternal Grandfather   . Pancreatic cancer Paternal Grandfather     Social History   Socioeconomic History  . Marital status: Divorced    Spouse name: Not on file  . Number of children: Not on file  . Years of education: Not on file  . Highest education level: Not on file  Occupational History  . Not on file  Social Needs  . Financial resource strain: Not on file  . Food insecurity:    Worry: Not on file    Inability: Not on file  . Transportation needs:    Medical: Not on file    Non-medical: Not on file  Tobacco Use  . Smoking status: Former Smoker    Packs/day: 1.00    Last attempt to quit: 12/07/2011    Years since quitting: 6.4  . Smokeless tobacco: Never Used  Substance and Sexual Activity  . Alcohol use: Yes  . Drug use: No  . Sexual activity: Yes    Birth control/protection: Condom  Lifestyle  . Physical activity:    Days per week: Not on file    Minutes per session: Not on file  . Stress: Not on file  Relationships  . Social connections:    Talks on phone: Not on file    Gets together: Not on file    Attends religious service: Not on file    Active member of club or organization: Not on file    Attends meetings of clubs or organizations: Not on file    Relationship status: Not on file  . Intimate partner violence:    Fear of current or ex partner: Not on file    Emotionally abused: Not on file    Physically abused: Not on file    Forced sexual activity: Not on file  Other Topics Concern  . Not on file  Social History Narrative   Divorced, 1 son (Rylan).   2 years of college.   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    Former smoker (quit 12/07/2011), no drug use, 1-2 drinks a week.   Drinks caffeine, takes a daily vitamin   Wears his seatbelt, smoke detector in the home, firearms in the home.    Exercises > 3x a day   Feels safe in relationships    Review of  Systems: ROS is O/W negative except as mentioned in HPI.  Physical Exam: Vital signs in last 24 hours: Temp:  [98.2 F (36.8 C)] 98.2 F (36.8 C) (06/13 1121) Pulse Rate:  [76-81] 81 (06/13 1121) Resp:  [15] 15 (06/13 1121) BP: (120-124)/(80-87) 120/87 (06/13 1121) SpO2:  [97 %] 97 % (06/13 1121) Weight:  [197 lb (89.4 kg)] 197 lb (89.4 kg) (06/12 1441)  General:  Alert, Well-developed, well-nourished, pleasant and cooperative in NAD; appears jaundice Head:  Normocephalic and atraumatic. Eyes:  Scleral icterus noted.  Ears:  Normal auditory acuity. Mouth:  No deformity or lesions.   Lungs:  Clear throughout to auscultation.   No wheezes, crackles, or rhonchi.  Heart:  Regular rate and rhythm; no murmurs, clicks, rubs, or gallops. Abdomen:  Soft, non-distended.  BS present.  Mild diffuse TTP. Msk:  Symmetrical without gross deformities. Pulses:  Normal pulses noted. Extremities:  Without clubbing or edema. Neurologic:  Alert and oriented x 4;  grossly normal neurologically. Skin:  Intact without significant lesions or rashes. Psych:  Alert and cooperative. Normal mood and affect.  Lab Results: Recent Labs    05/26/18 1548  WBC 8.5  HGB 14.2  HCT 41.7  PLT 383.0   BMET Recent Labs    05/26/18 1548  NA 135  K 4.0  CL 92*  CO2 34*  GLUCOSE 121*  BUN 10  CREATININE 0.94  CALCIUM 9.8   LFT Recent Labs    05/26/18 1548  PROT 8.1  ALBUMIN 4.4  AST 245*  ALT 296*  ALKPHOS 418*  BILITOT 8.3*   PT/INR Recent Labs    05/26/18 1548  LABPROT 13.9*  INR 1.2*   IMPRESSION:  *57 year old male with 6 weeks of bloating, fullness, early satiety, and abdominal discomfort that has been progressively worsening and now having a lot of nausea and vomiting at times, pain more severe.  Abnormal CT scan showing malignancy with suspect carcinomatosis but unknown primary.  Now LFT's markedly elevated.  Needs expedited evaluation.  PLAN: *Will repeat CT scan chest, abdomen,  and pelvis today. *Tentatively planning for EGD and possible ERCP on 6/14. *Pain control and antiemetics. *Monitor labs/trend LFT's.   Janett Billow D. Zehr  05/27/2018, 12:39 PM

## 2018-05-28 ENCOUNTER — Encounter (HOSPITAL_COMMUNITY)
Admission: AD | Disposition: A | Discharge status: Home / Self Care | Payer: Self-pay | Source: Home / Self Care | Attending: Internal Medicine

## 2018-05-28 ENCOUNTER — Inpatient Hospital Stay (HOSPITAL_COMMUNITY): Payer: 59 | Admitting: Registered Nurse

## 2018-05-28 ENCOUNTER — Encounter (HOSPITAL_COMMUNITY): Payer: Self-pay | Admitting: *Deleted

## 2018-05-28 ENCOUNTER — Encounter: Payer: 59 | Admitting: Gastroenterology

## 2018-05-28 ENCOUNTER — Inpatient Hospital Stay (HOSPITAL_COMMUNITY): Payer: 59

## 2018-05-28 DIAGNOSIS — C169 Malignant neoplasm of stomach, unspecified: Secondary | ICD-10-CM

## 2018-05-28 DIAGNOSIS — R945 Abnormal results of liver function studies: Secondary | ICD-10-CM

## 2018-05-28 DIAGNOSIS — K831 Obstruction of bile duct: Secondary | ICD-10-CM

## 2018-05-28 DIAGNOSIS — R7989 Other specified abnormal findings of blood chemistry: Secondary | ICD-10-CM

## 2018-05-28 DIAGNOSIS — C762 Malignant neoplasm of abdomen: Secondary | ICD-10-CM

## 2018-05-28 HISTORY — PX: IR INT EXT BILIARY DRAIN WITH CHOLANGIOGRAM: IMG6044

## 2018-05-28 HISTORY — PX: BIOPSY: SHX5522

## 2018-05-28 HISTORY — PX: ESOPHAGOGASTRODUODENOSCOPY (EGD) WITH PROPOFOL: SHX5813

## 2018-05-28 LAB — COMPREHENSIVE METABOLIC PANEL
ALT: 234 U/L — ABNORMAL HIGH (ref 17–63)
ANION GAP: 7 (ref 5–15)
AST: 196 U/L — ABNORMAL HIGH (ref 15–41)
Albumin: 3.3 g/dL — ABNORMAL LOW (ref 3.5–5.0)
Alkaline Phosphatase: 317 U/L — ABNORMAL HIGH (ref 38–126)
BILIRUBIN TOTAL: 9.4 mg/dL — AB (ref 0.3–1.2)
BUN: 7 mg/dL (ref 6–20)
CO2: 33 mmol/L — ABNORMAL HIGH (ref 22–32)
Calcium: 8.5 mg/dL — ABNORMAL LOW (ref 8.9–10.3)
Chloride: 97 mmol/L — ABNORMAL LOW (ref 101–111)
Creatinine, Ser: 0.59 mg/dL — ABNORMAL LOW (ref 0.61–1.24)
Glucose, Bld: 112 mg/dL — ABNORMAL HIGH (ref 65–99)
POTASSIUM: 3.9 mmol/L (ref 3.5–5.1)
Sodium: 137 mmol/L (ref 135–145)
TOTAL PROTEIN: 6.9 g/dL (ref 6.5–8.1)

## 2018-05-28 LAB — CBC
HEMATOCRIT: 37.8 % — AB (ref 39.0–52.0)
HEMOGLOBIN: 12.4 g/dL — AB (ref 13.0–17.0)
MCH: 30.2 pg (ref 26.0–34.0)
MCHC: 32.8 g/dL (ref 30.0–36.0)
MCV: 92 fL (ref 78.0–100.0)
Platelets: 409 10*3/uL — ABNORMAL HIGH (ref 150–400)
RBC: 4.11 MIL/uL — ABNORMAL LOW (ref 4.22–5.81)
RDW: 13.3 % (ref 11.5–15.5)
WBC: 7.8 10*3/uL (ref 4.0–10.5)

## 2018-05-28 LAB — CANCER ANTIGEN 19-9: CA 19 9: 23032 U/mL — AB (ref <34)

## 2018-05-28 LAB — CEA: CEA: 24.6 ng/mL — AB

## 2018-05-28 SURGERY — ESOPHAGOGASTRODUODENOSCOPY (EGD) WITH PROPOFOL
Anesthesia: General | Laterality: Left

## 2018-05-28 MED ORDER — DEXAMETHASONE SODIUM PHOSPHATE 10 MG/ML IJ SOLN
INTRAMUSCULAR | Status: DC | PRN
  Administered 2018-05-28: 10 mg via INTRAVENOUS

## 2018-05-28 MED ORDER — FLUMAZENIL 0.5 MG/5ML IV SOLN
INTRAVENOUS | Status: AC
  Filled 2018-05-28: qty 5

## 2018-05-28 MED ORDER — PROPOFOL 10 MG/ML IV BOLUS
INTRAVENOUS | Status: DC | PRN
  Administered 2018-05-28: 120 mg via INTRAVENOUS

## 2018-05-28 MED ORDER — NALOXEGOL OXALATE 12.5 MG PO TABS
12.5000 mg | ORAL_TABLET | Freq: Every day | ORAL | Status: DC
  Administered 2018-05-29 – 2018-06-02 (×4): 12.5 mg via ORAL
  Filled 2018-05-28 (×8): qty 1

## 2018-05-28 MED ORDER — NALOXONE HCL 0.4 MG/ML IJ SOLN
INTRAMUSCULAR | Status: AC
  Filled 2018-05-28: qty 1

## 2018-05-28 MED ORDER — CIPROFLOXACIN IN D5W 400 MG/200ML IV SOLN: 400.0000 mg | Freq: Once | INTRAVENOUS | Status: DC

## 2018-05-28 MED ORDER — SUGAMMADEX SODIUM 200 MG/2ML IV SOLN
INTRAVENOUS | Status: DC | PRN
  Administered 2018-05-28: 200 mg via INTRAVENOUS

## 2018-05-28 MED ORDER — CIPROFLOXACIN IN D5W 400 MG/200ML IV SOLN
INTRAVENOUS | Status: AC
  Filled 2018-05-28: qty 200

## 2018-05-28 MED ORDER — SUCRALFATE 1 GM/10ML PO SUSP
1.0000 g | Freq: Three times a day (TID) | ORAL | Status: DC
  Administered 2018-05-28 – 2018-06-03 (×21): 1 g via ORAL
  Filled 2018-05-28 (×22): qty 10

## 2018-05-28 MED ORDER — FENTANYL CITRATE (PF) 100 MCG/2ML IJ SOLN
INTRAMUSCULAR | Status: AC
  Filled 2018-05-28: qty 4

## 2018-05-28 MED ORDER — GLUCAGON HCL RDNA (DIAGNOSTIC) 1 MG IJ SOLR
INTRAMUSCULAR | Status: AC
  Filled 2018-05-28: qty 1

## 2018-05-28 MED ORDER — FLUCONAZOLE 100 MG PO TABS
200.0000 mg | ORAL_TABLET | Freq: Every day | ORAL | Status: DC
  Administered 2018-05-29 – 2018-06-03 (×6): 200 mg via ORAL
  Filled 2018-05-28 (×6): qty 2

## 2018-05-28 MED ORDER — PANTOPRAZOLE SODIUM 40 MG PO TBEC
40.0000 mg | DELAYED_RELEASE_TABLET | Freq: Two times a day (BID) | ORAL | Status: DC
  Administered 2018-05-28 – 2018-06-03 (×12): 40 mg via ORAL
  Filled 2018-05-28 (×12): qty 1

## 2018-05-28 MED ORDER — MIDAZOLAM HCL 2 MG/2ML IJ SOLN
INTRAMUSCULAR | Status: AC
  Filled 2018-05-28: qty 2

## 2018-05-28 MED ORDER — SODIUM CHLORIDE 0.9% FLUSH
5.0000 mL | Freq: Three times a day (TID) | INTRAVENOUS | Status: DC
  Administered 2018-05-29 – 2018-06-03 (×11): 5 mL

## 2018-05-28 MED ORDER — IOPAMIDOL (ISOVUE-300) INJECTION 61%
INTRAVENOUS | Status: AC
  Administered 2018-05-28: 15 mL
  Filled 2018-05-28: qty 50

## 2018-05-28 MED ORDER — MIDAZOLAM HCL 5 MG/5ML IJ SOLN
INTRAMUSCULAR | Status: DC | PRN
  Administered 2018-05-28: 2 mg via INTRAVENOUS

## 2018-05-28 MED ORDER — FENTANYL CITRATE (PF) 250 MCG/5ML IJ SOLN
INTRAMUSCULAR | Status: AC
  Filled 2018-05-28: qty 5

## 2018-05-28 MED ORDER — LIDOCAINE 2% (20 MG/ML) 5 ML SYRINGE
INTRAMUSCULAR | Status: DC | PRN
  Administered 2018-05-28: 100 mg via INTRAVENOUS

## 2018-05-28 MED ORDER — ROCURONIUM BROMIDE 10 MG/ML (PF) SYRINGE
PREFILLED_SYRINGE | INTRAVENOUS | Status: DC | PRN
  Administered 2018-05-28: 50 mg via INTRAVENOUS

## 2018-05-28 MED ORDER — LIDOCAINE HCL 1 % IJ SOLN
INTRAMUSCULAR | Status: AC
  Filled 2018-05-28: qty 20

## 2018-05-28 MED ORDER — ONDANSETRON HCL 4 MG/2ML IJ SOLN
INTRAMUSCULAR | Status: DC | PRN
  Administered 2018-05-28: 4 mg via INTRAVENOUS

## 2018-05-28 MED ORDER — FENTANYL CITRATE (PF) 250 MCG/5ML IJ SOLN
INTRAMUSCULAR | Status: DC | PRN
  Administered 2018-05-28: 150 ug via INTRAVENOUS

## 2018-05-28 MED ORDER — SODIUM CHLORIDE 0.9 % IV SOLN: INTRAVENOUS | Status: DC

## 2018-05-28 MED ORDER — LIDOCAINE HCL 1 % IJ SOLN
INTRAMUSCULAR | Status: AC | PRN
  Administered 2018-05-28: 10 mL

## 2018-05-28 MED ORDER — INDOMETHACIN 50 MG RE SUPP
RECTAL | Status: AC
  Filled 2018-05-28: qty 2

## 2018-05-28 MED ORDER — PROPOFOL 10 MG/ML IV BOLUS
INTRAVENOUS | Status: AC
  Filled 2018-05-28: qty 20

## 2018-05-28 MED ORDER — PIPERACILLIN-TAZOBACTAM 3.375 G IVPB 30 MIN
3.3750 g | Freq: Once | INTRAVENOUS | Status: AC
  Administered 2018-05-28: 3.375 g via INTRAVENOUS
  Filled 2018-05-28: qty 50

## 2018-05-28 MED ORDER — MIDAZOLAM HCL 2 MG/2ML IJ SOLN
INTRAMUSCULAR | Status: AC
  Filled 2018-05-28: qty 4

## 2018-05-28 MED ORDER — LACTATED RINGERS IV SOLN
INTRAVENOUS | Status: DC
  Administered 2018-05-28: 1000 mL via INTRAVENOUS

## 2018-05-28 MED ORDER — FLUCONAZOLE 100 MG PO TABS
100.0000 mg | ORAL_TABLET | Freq: Every day | ORAL | Status: DC
  Administered 2018-05-28: 100 mg via ORAL
  Filled 2018-05-28: qty 1

## 2018-05-28 MED ORDER — FENTANYL CITRATE (PF) 100 MCG/2ML IJ SOLN
INTRAMUSCULAR | Status: AC | PRN
  Administered 2018-05-28 (×3): 50 ug via INTRAVENOUS

## 2018-05-28 MED ORDER — PIPERACILLIN-TAZOBACTAM 3.375 G IVPB
INTRAVENOUS | Status: AC
  Filled 2018-05-28: qty 50

## 2018-05-28 MED ORDER — INDOMETHACIN 50 MG RE SUPP: 100.0000 mg | Freq: Once | RECTAL | Status: DC

## 2018-05-28 MED ORDER — FENTANYL 25 MCG/HR TD PT72
25.0000 ug | MEDICATED_PATCH | TRANSDERMAL | Status: DC
  Administered 2018-05-28 – 2018-06-03 (×3): 25 ug via TRANSDERMAL
  Filled 2018-05-28 (×3): qty 1

## 2018-05-28 MED ORDER — MIDAZOLAM HCL 2 MG/2ML IJ SOLN
INTRAMUSCULAR | Status: AC | PRN
  Administered 2018-05-28 (×3): 1 mg via INTRAVENOUS

## 2018-05-28 SURGICAL SUPPLY — 15 items

## 2018-05-28 NOTE — Procedures (Signed)
Biliary obstruction, jaundice, malignancy  S/p RT INT/EXT BILIARY DRAIN  No comp Stable EBL 0 Bile aspirated  Full report in pacs

## 2018-05-28 NOTE — Anesthesia Procedure Notes (Signed)
Procedure Name: Intubation Date/Time: 05/28/2018 8:28 AM Performed by: Talbot Grumbling, CRNA Pre-anesthesia Checklist: Patient identified, Emergency Drugs available, Suction available and Patient being monitored Patient Re-evaluated:Patient Re-evaluated prior to induction Oxygen Delivery Method: Circle system utilized Preoxygenation: Pre-oxygenation with 100% oxygen Induction Type: IV induction Ventilation: Mask ventilation without difficulty Laryngoscope Size: Mac and 3 Grade View: Grade I Tube type: Oral Tube size: 7.5 mm Number of attempts: 1 Airway Equipment and Method: Stylet Placement Confirmation: ETT inserted through vocal cords under direct vision,  positive ETCO2 and breath sounds checked- equal and bilateral Secured at: 23 cm Tube secured with: Tape Dental Injury: Teeth and Oropharynx as per pre-operative assessment

## 2018-05-28 NOTE — Progress Notes (Addendum)
PROGRESS NOTE  JOSHUAJAMES MOEHRING CWC:[id number] DOB: [birth year] DOA: 05/27/2018 PCP: Ma Hillock, DO  HPI/Recap of past 24 hours:  [name] is a 57 y.o. male with medical history significant for squamous cell carcinoma, GERD, chronic constipation who presented to GI office yesterday, as a follow-up from a recent ER visit for complaints of abdominal fullness of 6 weeks duration. Seen by [facility] [place] (Dr. Ardis Hughs). Labs drawn there revealed significant transaminitis.  CT abdomen and pelvis revealed 3.3 cm masslike soft tissue in the pancreatic tail with proximal pancreatic ductal dilatation.  GI recommended admission due to abnormal CT scan showing malignancy with suspicion for carcinomatosis of unknown primary and abnormal labs.  Upon arrival at [facility] long hospital patient reports nausea with no vomiting and mid abdominal discomfort.  Started gentle IV fluid with pain management in place.  GI following and planning EGD +/- ERCP in the morning.  05/28/2018: Patient seen and examined at his bedside.  Post EGD with biopsies and percutaneous biliary drain by interventional radiology.  Tolerated procedures well.  Complains of abdominal pain and nausea.  EGD revealed stomach mass.  Oncology consulted.  Appreciate input.    Assessment/Plan: Active Problems:   Abnormal CT scan, gastrointestinal tract   Elevated liver enzymes   Carcinoma (HCC)   Elevated LFTs  Newly diagnosed mass lesions affecting pancreas and stomach Post EGD on 05/28/2018 with biopsies Awaiting results of biopsies Oncology consulted GI following Post percutaneous biliary drain by interventional radiology  Acute transaminitis with suspicion for biliary obstruction Status post biliary drain by interventional radiology AST ALT total bilirubin slowly trending down Repeat chemistry panel in the morning ERCP attempt failed today  Hyperbilirubinemia/jaundice Suspect secondary to biliary obstruction from mass versus  orders Status post biliary drain Management as stated above  GERD Continue PPI  Allergic rhinitis Continue Zyrtec.    Code Status: Full code  Family Communication: Father at bedside  Disposition Plan: Home when clinically stable   Consultants:  GI  Oncology  Procedures:  Percutaneous drain placement by interventional radiology  EGD  Antimicrobials:  Received 1 dose of IV Zosyn for procedure prophylaxis  DVT prophylaxis: Subcu Lovenox daily   Objective: Vitals:   05/28/18 1145 05/28/18 1150 05/28/18 1155 05/28/18 1200  BP: 127/80 123/82 121/85 (!) 129/100  Pulse: 75 67 61 64  Resp: (!) 26 14 (!) 23 19  Temp:      TempSrc:      SpO2: 98% 98% 99% 99%  Weight:      Height:        Intake/Output Summary (Last 24 hours) at 05/28/2018 1403 Last data filed at 05/28/2018 0941 Gross per 24 hour  Intake 932 ml  Output 0 ml  Net 932 ml   Filed Weights   05/27/18 1410 05/28/18 0758  Weight: 88.5 kg (195 lb) 88.5 kg (195 lb)    Exam:  . General: 57 y.o. year-old male well developed well nourished.  He appears mildly uncomfortable due to abdominal pain and nausea.  Alert and oriented x3. . Cardiovascular: Regular rate and rhythm with no rubs or gallops.  No thyromegaly or JVD noted.   Marland Kitchen Respiratory: Clear to auscultation with no wheezes or rales. Good inspiratory effort. . Abdomen: Distended with hypoactive bowel sounds.  Mildly tender on palpation diffusely.  .  Musculoskeletal: No lower extremity edema. 2/4 pulses in all 4 extremities. . Skin: No ulcerative lesions noted or rashes . Psychiatry: Mood is appropriate for condition and setting  Data Reviewed: CBC: Recent Labs  Lab 05/26/18 1548 05/27/18 1435 05/28/18 0352  WBC 8.5 9.1 7.8  NEUTROABS 5.7 6.4  --   HGB 14.2 13.1 12.4*  HCT 41.7 38.9* 37.8*  MCV 90.0 91.7 92.0  PLT 383.0 406* 425*   Basic Metabolic Panel: Recent Labs  Lab 05/26/18 1548 05/27/18 1435 05/28/18 0352  NA 135 137  137  K 4.0 3.9 3.9  CL 92* 94* 97*  CO2 34* 32 33*  GLUCOSE 121* 117* 112*  BUN 10 9 7   CREATININE 0.94 0.74 0.59*  CALCIUM 9.8 8.8* 8.5*   GFR: Estimated Creatinine Clearance: 112.4 mL/min (A) (by C-G formula based on SCr of 0.59 mg/dL (L)). Liver Function Tests: Recent Labs  Lab 05/26/18 1548 05/27/18 1435 05/28/18 0352  AST 245* 222* 196*  ALT 296* 263* 234*  ALKPHOS 418* 354* 317*  BILITOT 8.3* 9.8* 9.4*  PROT 8.1 7.5 6.9  ALBUMIN 4.4 3.5 3.3*   Recent Labs  Lab 05/26/18 1548 05/27/18 1435  LIPASE 30.0 32   No results for input(s): AMMONIA in the last 168 hours. Coagulation Profile: Recent Labs  Lab 05/26/18 1548  INR 1.2*   Cardiac Enzymes: No results for input(s): CKTOTAL, CKMB, CKMBINDEX, TROPONINI in the last 168 hours. BNP (last 3 results) No results for input(s): PROBNP in the last 8760 hours. HbA1C: No results for input(s): HGBA1C in the last 72 hours. CBG: No results for input(s): GLUCAP in the last 168 hours. Lipid Profile: No results for input(s): CHOL, HDL, LDLCALC, TRIG, CHOLHDL, LDLDIRECT in the last 72 hours. Thyroid Function Tests: No results for input(s): TSH, T4TOTAL, FREET4, T3FREE, THYROIDAB in the last 72 hours. Anemia Panel: No results for input(s): VITAMINB12, FOLATE, FERRITIN, TIBC, IRON, RETICCTPCT in the last 72 hours. Urine analysis:    Component Value Date/Time   COLORURINE YELLOW 05/19/2018 [place] 05/19/2018 1259   LABSPEC <1.005 (L) 05/19/2018 1259   PHURINE 7.0 05/19/2018 1259   GLUCOSEU NEGATIVE 05/19/2018 1259   HGBUR NEGATIVE 05/19/2018 1259   HGBUR negative 09/21/2009 1223   BILIRUBINUR NEGATIVE 05/19/2018 1259   BILIRUBINUR n 04/03/2015 1041   KETONESUR 15 (A) 05/19/2018 1259   PROTEINUR NEGATIVE 05/19/2018 1259   UROBILINOGEN 0.2 04/03/2015 1041   UROBILINOGEN 0.2 09/21/2009 1223   NITRITE NEGATIVE 05/19/2018 1259   LEUKOCYTESUR NEGATIVE 05/19/2018 1259   Sepsis  Labs: @LABRCNTIP (procalcitonin:4,lacticidven:4)  )No results found for this or any previous visit (from the past 240 hour(s)).    Studies: Ct Chest W Contrast  Result Date: 05/28/2018 CLINICAL DATA:  57 year old male with several weeks of abdominal discomfort. Mass involving the pancreas and stomach. Followup study. EXAM: CT CHEST, ABDOMEN, AND PELVIS WITH CONTRAST TECHNIQUE: Multidetector CT imaging of the chest, abdomen and pelvis was performed following the standard protocol during bolus administration of intravenous contrast. CONTRAST:  170mL ISOVUE-300 IOPAMIDOL (ISOVUE-300) INJECTION 61%, 16mL ISOVUE-300 IOPAMIDOL (ISOVUE-300) INJECTION 61% COMPARISON:  CT the abdomen and pelvis 05/19/2018. FINDINGS: CT CHEST FINDINGS Cardiovascular: Heart size is normal. There is no significant pericardial fluid, thickening or pericardial calcification. Mild atherosclerosis in the great vessels. No coronary artery calcifications. Mediastinum/Nodes: Some of the loculated fluid components or cystic neoplasm from the upper abdomen (discussed below) extends cephalad via the esophageal hiatus into the lower middle mediastinum to the right of the distal esophagus. No pathologically enlarged mediastinal or hilar lymph nodes are noted. Esophagus is unremarkable in appearance. No axillary lymphadenopathy. Lungs/Pleura: Small left-sided Bochdalek's hernia incidentally noted. No suspicious appearing pulmonary  nodules or masses are noted. No acute consolidative airspace disease. A few areas of mild linear scarring are noted. No pleural effusions. Musculoskeletal: Multiple old healed bilateral rib fractures with significant posttraumatic deformity of the thoracic cage. There are no aggressive appearing lytic or blastic lesions noted in the visualized portions of the skeleton. CT ABDOMEN PELVIS FINDINGS Hepatobiliary: In segment 2 of the liver there is a well-defined 1.2 cm low to intermediate attenuation lesion which is  incompletely characterized. No other intrahepatic lesion is confidently identified on today's examination. There is extensive scalloping of the surface of the liver which appears to be related to multiple serosal implants, the largest of which is overlying the right lobe of the liver where a low-attenuation implant or loculated fluid collection measures approximately 4.6 x 6.8 cm (axial image 51 of series 2). Mild intrahepatic biliary ductal dilatation. Common bile duct is poorly demonstrated. Status post cholecystectomy. Pancreas: In the tail of the pancreas there is a poorly defined mass which extends laterally and cephalad coming in contact with the adjacent left hemidiaphragm (which it appears to invade), as well as the lateral left upper abdominal wall and the lateral surface of the cardia and proximal body of the stomach. This mass measures approximately 5.5 x 6.4 x 5.2 cm (axial image 44 of series 2 and coronal image 98 of series 8). The remainder of the body and head of the pancreas are otherwise generally unremarkable in appearance. Spleen: Status post splenectomy. Adrenals/Urinary Tract: Bilateral kidneys and bilateral adrenal glands are normal in appearance. No hydroureteronephrosis. Urinary bladder is normal in appearance. Stomach/Bowel: There is a poorly defined infiltrative mass-like area in the proximal aspect of the stomach along the greater curvature which is estimated to measure approximately 11.5 x 4.1 x 6.0 cm (axial image 43 of series 2 and coronal image 77 of series 8). Several other more cystic appearing areas are also noted associated with the gastric wall both along the greater curvature, the inferior surface of the stomach, and along the lesser curvature in the antral pre-pyloric region. No pathologic dilatation of small bowel or colon. Appendix is not confidently identified. Vascular/Lymphatic: Aortic atherosclerosis, without evidence of aneurysm or dissection in the abdominal or pelvic  vasculature. Several prominent borderline enlarged and mildly enlarged retroperitoneal lymph nodes are noted measuring up to 1 cm in short axis in the left para-aortic nodal station (axial image 66 of series 2). In the upper abdomen in the expected location of portacaval, hepatic duodenal and celiac axis nodal stations there are large low-attenuation areas which have a cystic appearance, but could reflect necrotic lymphadenopathy. Some of this extends into the lower middle mediastinum (discussed above). While lymphadenopathy is not excluded, at this time these are favored to reflect malignant mucinous/cystic metastatic deposits Reproductive: Prostate gland and seminal vesicles are unremarkable in appearance. Other: Trace volume of ascites. Multifocal peritoneal nodularity noted, concerning for widespread intraperitoneal metastatic disease. Musculoskeletal: There are no aggressive appearing lytic or blastic lesions noted in the visualized portions of the skeleton. IMPRESSION: 1. Widespread intraperitoneal metastasis, as detailed above. The origin of this neoplasm is uncertain, and could either be from a large infiltrative mass in the tail of the pancreas, which has subsequently invaded the proximal stomach, or a large gastric neoplasm which extended into a splenectomy bed now involving the tail of the pancreas. Tissue sampling is recommended to establish a definitive diagnosis. 2. No definite evidence of intrapulmonary metastasis. 3. There does appear to be direct spread of disease through the esophageal  hiatus into the lower middle mediastinum. 4. Additional incidental findings, as above. Electronically Signed   By: Vinnie Langton M.D.   On: 05/28/2018 07:25   Ct Abdomen Pelvis W Contrast  Result Date: 05/28/2018 CLINICAL DATA:  57 year old male with several weeks of abdominal discomfort. Mass involving the pancreas and stomach. Followup study. EXAM: CT CHEST, ABDOMEN, AND PELVIS WITH CONTRAST TECHNIQUE:  Multidetector CT imaging of the chest, abdomen and pelvis was performed following the standard protocol during bolus administration of intravenous contrast. CONTRAST:  128mL ISOVUE-300 IOPAMIDOL (ISOVUE-300) INJECTION 61%, 58mL ISOVUE-300 IOPAMIDOL (ISOVUE-300) INJECTION 61% COMPARISON:  CT the abdomen and pelvis 05/19/2018. FINDINGS: CT CHEST FINDINGS Cardiovascular: Heart size is normal. There is no significant pericardial fluid, thickening or pericardial calcification. Mild atherosclerosis in the great vessels. No coronary artery calcifications. Mediastinum/Nodes: Some of the loculated fluid components or cystic neoplasm from the upper abdomen (discussed below) extends cephalad via the esophageal hiatus into the lower middle mediastinum to the right of the distal esophagus. No pathologically enlarged mediastinal or hilar lymph nodes are noted. Esophagus is unremarkable in appearance. No axillary lymphadenopathy. Lungs/Pleura: Small left-sided Bochdalek's hernia incidentally noted. No suspicious appearing pulmonary nodules or masses are noted. No acute consolidative airspace disease. A few areas of mild linear scarring are noted. No pleural effusions. Musculoskeletal: Multiple old healed bilateral rib fractures with significant posttraumatic deformity of the thoracic cage. There are no aggressive appearing lytic or blastic lesions noted in the visualized portions of the skeleton. CT ABDOMEN PELVIS FINDINGS Hepatobiliary: In segment 2 of the liver there is a well-defined 1.2 cm low to intermediate attenuation lesion which is incompletely characterized. No other intrahepatic lesion is confidently identified on today's examination. There is extensive scalloping of the surface of the liver which appears to be related to multiple serosal implants, the largest of which is overlying the right lobe of the liver where a low-attenuation implant or loculated fluid collection measures approximately 4.6 x 6.8 cm (axial image  51 of series 2). Mild intrahepatic biliary ductal dilatation. Common bile duct is poorly demonstrated. Status post cholecystectomy. Pancreas: In the tail of the pancreas there is a poorly defined mass which extends laterally and cephalad coming in contact with the adjacent left hemidiaphragm (which it appears to invade), as well as the lateral left upper abdominal wall and the lateral surface of the cardia and proximal body of the stomach. This mass measures approximately 5.5 x 6.4 x 5.2 cm (axial image 44 of series 2 and coronal image 98 of series 8). The remainder of the body and head of the pancreas are otherwise generally unremarkable in appearance. Spleen: Status post splenectomy. Adrenals/Urinary Tract: Bilateral kidneys and bilateral adrenal glands are normal in appearance. No hydroureteronephrosis. Urinary bladder is normal in appearance. Stomach/Bowel: There is a poorly defined infiltrative mass-like area in the proximal aspect of the stomach along the greater curvature which is estimated to measure approximately 11.5 x 4.1 x 6.0 cm (axial image 43 of series 2 and coronal image 77 of series 8). Several other more cystic appearing areas are also noted associated with the gastric wall both along the greater curvature, the inferior surface of the stomach, and along the lesser curvature in the antral pre-pyloric region. No pathologic dilatation of small bowel or colon. Appendix is not confidently identified. Vascular/Lymphatic: Aortic atherosclerosis, without evidence of aneurysm or dissection in the abdominal or pelvic vasculature. Several prominent borderline enlarged and mildly enlarged retroperitoneal lymph nodes are noted measuring up to 1 cm in short  axis in the left para-aortic nodal station (axial image 66 of series 2). In the upper abdomen in the expected location of portacaval, hepatic duodenal and celiac axis nodal stations there are large low-attenuation areas which have a cystic appearance, but  could reflect necrotic lymphadenopathy. Some of this extends into the lower middle mediastinum (discussed above). While lymphadenopathy is not excluded, at this time these are favored to reflect malignant mucinous/cystic metastatic deposits Reproductive: Prostate gland and seminal vesicles are unremarkable in appearance. Other: Trace volume of ascites. Multifocal peritoneal nodularity noted, concerning for widespread intraperitoneal metastatic disease. Musculoskeletal: There are no aggressive appearing lytic or blastic lesions noted in the visualized portions of the skeleton. IMPRESSION: 1. Widespread intraperitoneal metastasis, as detailed above. The origin of this neoplasm is uncertain, and could either be from a large infiltrative mass in the tail of the pancreas, which has subsequently invaded the proximal stomach, or a large gastric neoplasm which extended into a splenectomy bed now involving the tail of the pancreas. Tissue sampling is recommended to establish a definitive diagnosis. 2. No definite evidence of intrapulmonary metastasis. 3. There does appear to be direct spread of disease through the esophageal hiatus into the lower middle mediastinum. 4. Additional incidental findings, as above. Electronically Signed   By: Vinnie Langton M.D.   On: 05/28/2018 07:25    Scheduled Meds: . enoxaparin (LOVENOX) injection  40 mg Subcutaneous Q24H  . fentaNYL      . fluconazole  100 mg Oral Daily  . indomethacin  100 mg Rectal Once  . lidocaine      . midazolam      . pantoprazole  40 mg Oral Daily  . polyethylene glycol  17 g Oral Daily  . sodium chloride flush  5 mL Intracatheter Q8H    Continuous Infusions: . sodium chloride 75 mL/hr at 05/27/18 2006  . piperacillin-tazobactam       LOS: 1 day     Kayleen Memos, MD [facility] Pager [phone]  If 7PM-7AM, please contact night-coverage [email or web address] Password [facility] 05/28/2018, 2:03 PM

## 2018-05-28 NOTE — Transfer of Care (Signed)
Immediate Anesthesia Transfer of Care Note  Patient: TRAMOND SLINKER  Procedure(s) Performed: ESOPHAGOGASTRODUODENOSCOPY (EGD) WITH PROPOFOL (Left ) ENDOSCOPIC RETROGRADE CHOLANGIOPANCREATOGRAPHY (ERCP) (N/A ) BIOPSY  Patient Location: PACU  Anesthesia Type:General  Level of Consciousness: awake, alert  and oriented  Airway & Oxygen Therapy: Patient Spontanous Breathing and Patient connected to face mask oxygen  Post-op Assessment: Report given to RN and Post -op Vital signs reviewed and stable  Post vital signs: Reviewed and stable  Last Vitals:  Vitals Value Taken Time  BP    Temp    Pulse    Resp    SpO2      Last Pain:  Vitals:   05/28/18 0758  TempSrc: Oral  PainSc: 0-No pain      Patients Stated Pain Goal: 3 (95/97/47 1855)  Complications: No apparent anesthesia complications

## 2018-05-28 NOTE — Anesthesia Preprocedure Evaluation (Addendum)
Anesthesia Evaluation  Patient identified by MRN, date of birth, ID band Patient awake    Reviewed: Allergy & Precautions, NPO status , Patient's Chart, lab work & pertinent test results  Airway Mallampati: I  TM Distance: >3 FB Neck ROM: Full    Dental   Pulmonary former smoker,    Pulmonary exam normal        Cardiovascular Normal cardiovascular exam     Neuro/Psych    GI/Hepatic GERD  Medicated and Controlled,  Endo/Other    Renal/GU      Musculoskeletal   Abdominal   Peds  Hematology   Anesthesia Other Findings   Reproductive/Obstetrics                            Anesthesia Physical Anesthesia Plan  ASA: III  Anesthesia Plan: General   Post-op Pain Management:    Induction: Intravenous  PONV Risk Score and Plan: 2 and Ondansetron and Treatment may vary due to age or medical condition  Airway Management Planned: Oral ETT  Additional Equipment:   Intra-op Plan:   Post-operative Plan: Extubation in OR  Informed Consent: I have reviewed the patients History and Physical, chart, labs and discussed the procedure including the risks, benefits and alternatives for the proposed anesthesia with the patient or authorized representative who has indicated his/her understanding and acceptance.     Plan Discussed with: CRNA and Surgeon  Anesthesia Plan Comments:         Anesthesia Quick Evaluation

## 2018-05-28 NOTE — Progress Notes (Signed)
Chief Complaint: Patient was seen in consultation today for obstructive jaundice at the request of Dr. Oretha Caprice  Referring Physician(s): Dr. Oretha Caprice  Supervising Physician: Daryll Brod  Patient Status: [facility] - In-pt  History of Present Illness: [name] is a 57 y.o. 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 He has been admitted for ongoing pain and jaundice.  His bilirubin has risen from 1.4 to 9.8. He underwent EGD with intended ERCP today. Multiple gastric biopsies were obtained but they unable to advance into the duodenum. IR is now asked to perform PTC for biliary decompression.  Chart, imaging, meds, labs reviewed. Pt father at bedside. Has been NPO since ERCP earlier this am.  Past Medical History:  Diagnosis Date  . Allergy   . Arthritis   . Asplenia   . Basal cell carcinoma   . Erectile dysfunction   . Facial fracture ([place])   . GERD (gastroesophageal reflux disease)   . History of chicken pox   . Melanoma of thigh ([place])    left  . Squamous cell carcinoma, arm    left    Past Surgical History:  Procedure Laterality Date  . CHOLECYSTECTOMY  2009  . MANDIBLE SURGERY    . MELANOMA EXCISION WITH SENTINEL LYMPH NODE BIOPSY  1999  . NASAL SINUS SURGERY    . ORIF ORBITAL FRACTURE    . PALATE SURGERY    . right arm  09/14/08   x2 plates/screws  . SPLENECTOMY, TOTAL  2008   trauma  . zygomtic Left 2015   fracture/plate-screw    Allergies: Cephalexin; Other; Latex; and Tape  Medications:  Current Facility-Administered Medications:  .  0.9 %  sodium chloride infusion, , Intravenous, Continuous, Milus Banister, MD, Last Rate: 75 mL/hr at 05/27/18 2006 .  enoxaparin (LOVENOX) injection 40 mg, 40 mg, Subcutaneous, Q24H, Milus Banister, MD, 40 mg at 05/27/18 2107 .  fluconazole (DIFLUCAN) 40 MG/ML suspension 100 mg, 100 mg, Oral, Daily, Milus Banister, MD .  indomethacin (INDOCIN) 50 MG  suppository 100 mg, 100 mg, Rectal, Once, Milus Banister, MD .  iopamidol (ISOVUE-300) 61 % injection 15 mL, 15 mL, Oral, BID PRN, Milus Banister, MD, 30 mL at 05/27/18 1653 .  morphine 2 MG/ML injection 2 mg, 2 mg, Intravenous, Q2H PRN, Milus Banister, MD, 2 mg at 05/28/18 0505 .  ondansetron (ZOFRAN) injection 4 mg, 4 mg, Intravenous, Q6H PRN, Milus Banister, MD, 4 mg at 05/28/18 0507 .  oxyCODONE (Oxy IR/ROXICODONE) immediate release tablet 5 mg, 5 mg, Oral, Q4H PRN, Milus Banister, MD .  pantoprazole (PROTONIX) EC tablet 40 mg, 40 mg, Oral, Daily, Milus Banister, MD .  polyethylene glycol (MIRALAX / GLYCOLAX) packet 17 g, 17 g, Oral, Daily, Milus Banister, MD    Family History  Problem Relation Age of Onset  . Arthritis Mother   . Heart disease Mother   . Arthritis Father   . Hearing loss Father   . Pancreatic cancer Maternal Grandfather   . Pancreatic cancer Paternal Grandfather     Social History   Socioeconomic History  . Marital status: Divorced    Spouse name: Not on file  . Number of children: Not on file  . Years of education: Not on file  . Highest education level: Not on file  Occupational History  . Not on file  Social Needs  . Financial resource strain: Not  on file  . Food insecurity:    Worry: Not on file    Inability: Not on file  . Transportation needs:    Medical: Not on file    Non-medical: Not on file  Tobacco Use  . Smoking status: Former Smoker    Packs/day: 1.00    Last attempt to quit: 12/07/2011    Years since quitting: 6.4  . Smokeless tobacco: Never Used  Substance and Sexual Activity  . Alcohol use: Yes  . Drug use: No  . Sexual activity: Yes    Birth control/protection: Condom  Lifestyle  . Physical activity:    Days per week: Not on file    Minutes per session: Not on file  . Stress: Not on file  Relationships  . Social connections:    Talks on phone: Not on file    Gets together: Not on file    Attends religious  service: Not on file    Active member of club or organization: Not on file    Attends meetings of clubs or organizations: Not on file    Relationship status: Not on file  Other Topics Concern  . Not on file  Social History Narrative   Divorced, 1 son (Rylan).   2 years of college.   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    Former smoker (quit 12/07/2011), no drug use, 1-2 drinks a week.   Drinks caffeine, takes a daily vitamin   Wears his seatbelt, smoke detector in the home, firearms in the home.    Exercises > 3x a day   Feels safe in relationships     Review of Systems: A 12 point ROS discussed and pertinent positives are indicated in the HPI above.  All other systems are negative.  Review of Systems  Vital Signs: BP (!) 144/85   Pulse 68   Temp 98.5 F (36.9 C) (Oral)   Resp 10   Ht _0  (1.803 m)   Wt 195 lb (88.5 kg)   SpO2 97%   BMI 27.20 kg/m   Physical Exam  Constitutional: He is oriented to person, place, and time. He appears well-developed. No distress.  HENT:  Head: Normocephalic.  Mouth/Throat: Oropharynx is clear and moist.  Neck: Normal range of motion. No JVD present.  Cardiovascular: Normal rate, regular rhythm and normal heart sounds.  Pulmonary/Chest: Effort normal and breath sounds normal. No respiratory distress.  Abdominal: Soft. He exhibits distension. He exhibits no mass. There is tenderness. There is no guarding.  Neurological: He is alert and oriented to person, place, and time.  Skin: Skin is warm and dry.  jaundiced  Psychiatric: He has a normal mood and affect.      Imaging: Ct Chest W Contrast  Result Date: 05/28/2018 CLINICAL DATA:  57 year old male with several weeks of abdominal discomfort. Mass involving the pancreas and stomach. Followup study. EXAM: CT CHEST, ABDOMEN, AND PELVIS WITH CONTRAST TECHNIQUE: Multidetector CT imaging of the chest, abdomen and pelvis was performed following the standard protocol  during bolus administration of intravenous contrast. CONTRAST:  162m ISOVUE-300 IOPAMIDOL (ISOVUE-300) INJECTION 61%, 37mISOVUE-300 IOPAMIDOL (ISOVUE-300) INJECTION 61% COMPARISON:  CT the abdomen and pelvis 05/19/2018. FINDINGS: CT CHEST FINDINGS Cardiovascular: Heart size is normal. There is no significant pericardial fluid, thickening or pericardial calcification. Mild atherosclerosis in the great vessels. No coronary artery calcifications. Mediastinum/Nodes: Some of the loculated fluid components or cystic neoplasm from the upper abdomen (discussed below) extends cephalad via the esophageal hiatus  into the lower middle mediastinum to the right of the distal esophagus. No pathologically enlarged mediastinal or hilar lymph nodes are noted. Esophagus is unremarkable in appearance. No axillary lymphadenopathy. Lungs/Pleura: Small left-sided Bochdalek's hernia incidentally noted. No suspicious appearing pulmonary nodules or masses are noted. No acute consolidative airspace disease. A few areas of mild linear scarring are noted. No pleural effusions. Musculoskeletal: Multiple old healed bilateral rib fractures with significant posttraumatic deformity of the thoracic cage. There are no aggressive appearing lytic or blastic lesions noted in the visualized portions of the skeleton. CT ABDOMEN PELVIS FINDINGS Hepatobiliary: In segment 2 of the liver there is a well-defined 1.2 cm low to intermediate attenuation lesion which is incompletely characterized. No other intrahepatic lesion is confidently identified on today's examination. There is extensive scalloping of the surface of the liver which appears to be related to multiple serosal implants, the largest of which is overlying the right lobe of the liver where a low-attenuation implant or loculated fluid collection measures approximately 4.6 x 6.8 cm (axial image 51 of series 2). Mild intrahepatic biliary ductal dilatation. Common bile duct is poorly demonstrated.  Status post cholecystectomy. Pancreas: In the tail of the pancreas there is a poorly defined mass which extends laterally and cephalad coming in contact with the adjacent left hemidiaphragm (which it appears to invade), as well as the lateral left upper abdominal wall and the lateral surface of the cardia and proximal body of the stomach. This mass measures approximately 5.5 x 6.4 x 5.2 cm (axial image 44 of series 2 and coronal image 98 of series 8). The remainder of the body and head of the pancreas are otherwise generally unremarkable in appearance. Spleen: Status post splenectomy. Adrenals/Urinary Tract: Bilateral kidneys and bilateral adrenal glands are normal in appearance. No hydroureteronephrosis. Urinary bladder is normal in appearance. Stomach/Bowel: There is a poorly defined infiltrative mass-like area in the proximal aspect of the stomach along the greater curvature which is estimated to measure approximately 11.5 x 4.1 x 6.0 cm (axial image 43 of series 2 and coronal image 77 of series 8). Several other more cystic appearing areas are also noted associated with the gastric wall both along the greater curvature, the inferior surface of the stomach, and along the lesser curvature in the antral pre-pyloric region. No pathologic dilatation of small bowel or colon. Appendix is not confidently identified. Vascular/Lymphatic: Aortic atherosclerosis, without evidence of aneurysm or dissection in the abdominal or pelvic vasculature. Several prominent borderline enlarged and mildly enlarged retroperitoneal lymph nodes are noted measuring up to 1 cm in short axis in the left para-aortic nodal station (axial image 66 of series 2). In the upper abdomen in the expected location of portacaval, hepatic duodenal and celiac axis nodal stations there are large low-attenuation areas which have a cystic appearance, but could reflect necrotic lymphadenopathy. Some of this extends into the lower middle mediastinum (discussed  above). While lymphadenopathy is not excluded, at this time these are favored to reflect malignant mucinous/cystic metastatic deposits Reproductive: Prostate gland and seminal vesicles are unremarkable in appearance. Other: Trace volume of ascites. Multifocal peritoneal nodularity noted, concerning for widespread intraperitoneal metastatic disease. Musculoskeletal: There are no aggressive appearing lytic or blastic lesions noted in the visualized portions of the skeleton. IMPRESSION: 1. Widespread intraperitoneal metastasis, as detailed above. The origin of this neoplasm is uncertain, and could either be from a large infiltrative mass in the tail of the pancreas, which has subsequently invaded the proximal stomach, or a large gastric neoplasm which  extended into a splenectomy bed now involving the tail of the pancreas. Tissue sampling is recommended to establish a definitive diagnosis. 2. No definite evidence of intrapulmonary metastasis. 3. There does appear to be direct spread of disease through the esophageal hiatus into the lower middle mediastinum. 4. Additional incidental findings, as above. Electronically Signed   By: Vinnie Langton M.D.   On: 05/28/2018 07:25   Ct Abdomen Pelvis W Contrast  Result Date: 05/28/2018 CLINICAL DATA:  57 year old male with several weeks of abdominal discomfort. Mass involving the pancreas and stomach. Followup study. EXAM: CT CHEST, ABDOMEN, AND PELVIS WITH CONTRAST TECHNIQUE: Multidetector CT imaging of the chest, abdomen and pelvis was performed following the standard protocol during bolus administration of intravenous contrast. CONTRAST:  146m ISOVUE-300 IOPAMIDOL (ISOVUE-300) INJECTION 61%, 380mISOVUE-300 IOPAMIDOL (ISOVUE-300) INJECTION 61% COMPARISON:  CT the abdomen and pelvis 05/19/2018. FINDINGS: CT CHEST FINDINGS Cardiovascular: Heart size is normal. There is no significant pericardial fluid, thickening or pericardial calcification. Mild atherosclerosis in the  great vessels. No coronary artery calcifications. Mediastinum/Nodes: Some of the loculated fluid components or cystic neoplasm from the upper abdomen (discussed below) extends cephalad via the esophageal hiatus into the lower middle mediastinum to the right of the distal esophagus. No pathologically enlarged mediastinal or hilar lymph nodes are noted. Esophagus is unremarkable in appearance. No axillary lymphadenopathy. Lungs/Pleura: Small left-sided Bochdalek's hernia incidentally noted. No suspicious appearing pulmonary nodules or masses are noted. No acute consolidative airspace disease. A few areas of mild linear scarring are noted. No pleural effusions. Musculoskeletal: Multiple old healed bilateral rib fractures with significant posttraumatic deformity of the thoracic cage. There are no aggressive appearing lytic or blastic lesions noted in the visualized portions of the skeleton. CT ABDOMEN PELVIS FINDINGS Hepatobiliary: In segment 2 of the liver there is a well-defined 1.2 cm low to intermediate attenuation lesion which is incompletely characterized. No other intrahepatic lesion is confidently identified on today's examination. There is extensive scalloping of the surface of the liver which appears to be related to multiple serosal implants, the largest of which is overlying the right lobe of the liver where a low-attenuation implant or loculated fluid collection measures approximately 4.6 x 6.8 cm (axial image 51 of series 2). Mild intrahepatic biliary ductal dilatation. Common bile duct is poorly demonstrated. Status post cholecystectomy. Pancreas: In the tail of the pancreas there is a poorly defined mass which extends laterally and cephalad coming in contact with the adjacent left hemidiaphragm (which it appears to invade), as well as the lateral left upper abdominal wall and the lateral surface of the cardia and proximal body of the stomach. This mass measures approximately 5.5 x 6.4 x 5.2 cm (axial  image 44 of series 2 and coronal image 98 of series 8). The remainder of the body and head of the pancreas are otherwise generally unremarkable in appearance. Spleen: Status post splenectomy. Adrenals/Urinary Tract: Bilateral kidneys and bilateral adrenal glands are normal in appearance. No hydroureteronephrosis. Urinary bladder is normal in appearance. Stomach/Bowel: There is a poorly defined infiltrative mass-like area in the proximal aspect of the stomach along the greater curvature which is estimated to measure approximately 11.5 x 4.1 x 6.0 cm (axial image 43 of series 2 and coronal image 77 of series 8). Several other more cystic appearing areas are also noted associated with the gastric wall both along the greater curvature, the inferior surface of the stomach, and along the lesser curvature in the antral pre-pyloric region. No pathologic dilatation of small bowel  or colon. Appendix is not confidently identified. Vascular/Lymphatic: Aortic atherosclerosis, without evidence of aneurysm or dissection in the abdominal or pelvic vasculature. Several prominent borderline enlarged and mildly enlarged retroperitoneal lymph nodes are noted measuring up to 1 cm in short axis in the left para-aortic nodal station (axial image 66 of series 2). In the upper abdomen in the expected location of portacaval, hepatic duodenal and celiac axis nodal stations there are large low-attenuation areas which have a cystic appearance, but could reflect necrotic lymphadenopathy. Some of this extends into the lower middle mediastinum (discussed above). While lymphadenopathy is not excluded, at this time these are favored to reflect malignant mucinous/cystic metastatic deposits Reproductive: Prostate gland and seminal vesicles are unremarkable in appearance. Other: Trace volume of ascites. Multifocal peritoneal nodularity noted, concerning for widespread intraperitoneal metastatic disease. Musculoskeletal: There are no aggressive  appearing lytic or blastic lesions noted in the visualized portions of the skeleton. IMPRESSION: 1. Widespread intraperitoneal metastasis, as detailed above. The origin of this neoplasm is uncertain, and could either be from a large infiltrative mass in the tail of the pancreas, which has subsequently invaded the proximal stomach, or a large gastric neoplasm which extended into a splenectomy bed now involving the tail of the pancreas. Tissue sampling is recommended to establish a definitive diagnosis. 2. No definite evidence of intrapulmonary metastasis. 3. There does appear to be direct spread of disease through the esophageal hiatus into the lower middle mediastinum. 4. Additional incidental findings, as above. Electronically Signed   By: Vinnie Langton M.D.   On: 05/28/2018 07:25   Ct Abdomen Pelvis W Contrast  Result Date: 05/19/2018 CLINICAL DATA:  Mid and lower abdominal pain for several months. Nausea and vomiting with hematemesis beginning last night. Personal history of melanoma and cutaneous squamous cell carcinoma. EXAM: CT ABDOMEN AND PELVIS WITH CONTRAST TECHNIQUE: Multidetector CT imaging of the abdomen and pelvis was performed using the standard protocol following bolus administration of intravenous contrast. CONTRAST:  155m ISOVUE-300 IOPAMIDOL (ISOVUE-300) INJECTION 61% COMPARISON:  None. FINDINGS: Lower Chest: No acute findings. Old bilateral rib fracture deformities noted. Hepatobiliary: No hepatic masses identified. Small cysts seen in the posterior left hepatic lobe. Prior cholecystectomy. No evidence of biliary obstruction. Pancreas: Ill-defined low-attenuation mass is seen in the pancreatic tail measuring 3.3 x 2.0 cm on image 24/2. Pancreatic ductal dilatation is seen in the distal pancreatic tail upstream from this mass. Spleen: Prior splenectomy. Ill-defined soft tissue mass in the splenectomy bed which abuts the lateral wall the stomach and pancreatic tail. This measures 5.8 x 4.2 cm  on image 19/2. Adrenals/Urinary Tract: No masses identified. No evidence of hydronephrosis. Stomach/Bowel: Bulky masslike soft tissue density is seen along the lateral wall of the gastric body and fundus. A fluid density component is seen along the inferolateral wall of the gastric body which measures 6.3 x 3.2 cm on image 10/7. Ill-defined adjacent soft tissue density is also seen in the left upper quadrant, as described in the spleen section above. Proximal small bowel loops are distended by oral contrast, but no transition point is seen suggesting this is due to bolus ingestion of contrast. Vascular/Lymphatic: Mild lymphadenopathy is seen in the left paraaortic region, largest measuring 1.5 cm on image 30/2. No other pathologically enlarged lymph nodes identified. No significant vascular abnormality . Reproductive:  No mass or other significant abnormality. Other: Mild ascites is seen. Multilocular fluid collections are seen in the upper abdomen in the gastrohepatic ligament, porta hepatis, and portacaval spaces. Mild peritoneal thickening and  enhancement is seen in the right paracolic gutter and dependent portion of the pelvis. Mild soft tissue stranding and nodularity is also seen in the region of the greater omentum in the left abdomen. 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 Musculoskeletal: No suspicious bone lesions identified. IMPRESSION: 3.3 cm masslike soft tissue density in the pancreatic tail with proximal pancreatic ductal dilatation. This could be due to primary pancreatic carcinoma or metastatic disease involving the pancreas. Bulky masslike soft tissue density along the lateral wall the stomach, and in the adjacent gastrosplenic ligament. This is suspicious for metastatic disease, although primary gastric carcinoma cannot be excluded. Consider endoscopy and [place] for further evaluation. Mild ascites with multiloculated fluid collections in the upper abdomen. Peritoneal and omental  soft tissue thickening and nodularity, highly suspicious for peritoneal carcinomatosis. Electronically Signed   By: Earle Gell M.D.   On: 05/19/2018 11:29    Labs:  CBC: Recent Labs    05/19/18 0903 05/26/18 1548 05/27/18 1435 05/28/18 0352  WBC 12.2* 8.5 9.1 7.8  HGB 14.7 14.2 13.1 12.4*  HCT 42.9 41.7 38.9* 37.8*  PLT 378 383.0 406* 409*    COAGS: Recent Labs    05/26/18 1548  INR 1.2*    BMP: Recent Labs    05/19/18 0903 05/26/18 1548 05/27/18 1435 05/28/18 0352  NA 139 135 137 137  K 3.9 4.0 3.9 3.9  CL 100* 92* 94* 97*  CO2 28 34* 32 33*  GLUCOSE 131* 121* 117* 112*  BUN _0 CALCIUM 8.9 9.8 8.8* 8.5*  CREATININE 0.92 0.94 0.74 0.59*  GFRNONAA >60  --  >60 >60  GFRAA >60  --  >60 >60    LIVER FUNCTION TESTS: Recent Labs    05/19/18 0903 05/26/18 1548 05/27/18 1435 05/28/18 0352  BILITOT 1.4* 8.3* 9.8* 9.4*  AST 49* 245* 222* 196*  ALT 48 296* 263* 234*  ALKPHOS 123 418* 354* 317*  PROT 8.0 8.1 7.5 6.9  ALBUMIN 4.2 4.4 3.5 3.3*    TUMOR MARKERS: Recent Labs    05/26/18 1548  CA199 23,032*    Assessment and Plan: Obstructive jaundice secondary to malignant process. Reviewed imaging and labs, will attempt PTC and biliary drainage. Risks and benefits of PTC and biliary drainage was discussed with the patient and his father including, but not limited to bleeding, infection which may lead to sepsis or even death and damage to adjacent structures.  This interventional procedure involves the use of X-rays and because of the nature of the planned procedure, it is possible that we will have prolonged use of X-ray fluoroscopy.  Potential radiation risks to you include (but are not limited to) the following: - A slightly elevated risk for cancer  several years later in life. This risk is typically less than 0.5% percent. This risk is low in comparison to the normal incidence of human cancer, which is 33% for women and 50% for men according  to the [place]. - Radiation induced injury can include skin redness, resembling a rash, tissue breakdown / ulcers and hair loss (which can be temporary or permanent).   The likelihood of either of these occurring depends on the difficulty of the procedure and whether you are sensitive to radiation due to previous procedures, disease, or genetic conditions.   IF your procedure requires a prolonged use of radiation, you will be notified and given written instructions for further action.  It is your responsibility to monitor the irradiated area for  the 2 weeks following the procedure and to notify your physician if you are concerned that you have suffered a radiation induced injury.    All of the patient's questions were answered, patient is agreeable to proceed.  Consent signed and in chart.    Thank you for this interesting consult.  I greatly enjoyed meeting ROLLY MAGRI and look forward to participating in their care.  A copy of this report was sent to the requesting provider on this date.  Electronically Signed: Ascencion Dike, PA-C 05/28/2018, 10:50 AM   I spent a total of 20 minutes in face to face in clinical consultation, greater than 50% of which was counseling/coordinating care for obstructive jaundice/PTC

## 2018-05-28 NOTE — Anesthesia Postprocedure Evaluation (Signed)
Anesthesia Post Note  Patient: [name]  Procedure(s) Performed: ESOPHAGOGASTRODUODENOSCOPY (EGD) WITH PROPOFOL (Left ) BIOPSY     Patient location during evaluation: PACU Anesthesia Type: General Level of consciousness: awake and alert Pain management: pain level controlled Vital Signs Assessment: post-procedure vital signs reviewed and stable Respiratory status: spontaneous breathing, nonlabored ventilation, respiratory function stable and patient connected to nasal cannula oxygen Cardiovascular status: blood pressure returned to baseline and stable Postop Assessment: no apparent nausea or vomiting Anesthetic complications: no    Last Vitals:  Vitals:   05/28/18 0951 05/28/18 1001  BP: 139/87 (!) 144/85  Pulse: 68 68  Resp: 14 10  Temp:    SpO2: 98% 97%    Last Pain:  Vitals:   05/28/18 1001  TempSrc:   PainSc: 0-No pain                 Coyle Stordahl DAVID
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
## 2018-05-28 NOTE — Op Note (Addendum)
[facility] Patient Name: [name] Procedure Date: 05/28/2018 MRN: [id number] Attending MD: Milus Banister , MD Date of Birth: Oct 07, 1961 CSN: [id number] Age: 57 Admit Type: Inpatient Procedure:                Upper GI endoscopy Indications:              vomiting, abdominal pain, weight loss; abnormal CT                            scan (pancreatic tail invading stomach vs. gastric                            cancer invading pancreas; widespread throughout                            abdomen, elevated liver tests with dilated                            intrahepatic bile ducts) Providers:                Milus Banister, MD, Cleda Daub, RN, William Dalton, Technician Referring MD:              Medicines:                Monitored Anesthesia Care Complications:            No immediate complications. Estimated blood loss:                            None. Estimated Blood Loss:     Estimated blood loss: none. Procedure:                Pre-Anesthesia Assessment:                           - Prior to the procedure, a History and Physical                            was performed, and patient medications and                            allergies were reviewed. The patient's tolerance of                            previous anesthesia was also reviewed. The risks                            and benefits of the procedure and the sedation                            options and risks were discussed with the patient.                            All questions were answered, and  informed consent                            was obtained. Prior Anticoagulants: The patient has                            taken Eliquis (apixaban), last dose was 2 days                            prior to procedure. ASA Grade Assessment: III - A                            patient with severe systemic disease. After                            reviewing the risks and benefits, the  patient was                            deemed in satisfactory condition to undergo the                            procedure.                           After obtaining informed consent, the endoscope was                            passed under direct vision. Throughout the                            procedure, the patient's blood pressure, pulse, and                            oxygen saturations were monitored continuously. The                            Endoscope was introduced through the mouth, and                            advanced to the duodenal bulb. The upper GI                            endoscopy was accomplished without difficulty. The                            patient tolerated the procedure well. Scope In: Scope Out: Findings:      1. There was a clearly malignant process involving the proximal 1/3 to       1/2 of the greater curvature of his stomach (friable, firm, ulcerated,       fungating tumor). I biopsied this extensively. The stomach was overall       very rigid and could not be distended with air. It was difficult to       advance an adult gastroscope into the duodenum and impossible to advance  a side viewing duodenoscope past about mid stomach. This was not because       of obstructing tumor, rather I felt it was the rigidity of the stomach       which did not allow further passage of the duodenoscope and so I could       not perform the planned ERCP (despite multiple tries, abdominal pressure       and patient position change).      2. Typical appearing candida esophagitis (yellow exudate) within the       mid/distal esophagus Impression:               - There was a clearly malignant process involving                            the proximal 1/3 to 1/2 of the greater curvature of                            his stomach (friable, firm, ulcerated, fungating                            tumor). I biopsied this extensively. The stomach                             was overall very rigid and could not be distended                            with air. It was difficult to advance an adult                            gastroscope into the duodenum and impossible to                            advance a side viewing duodenoscope past about mid                            stomach. This was not because of obstructing tumor,                            rather I felt it was the rigidity of the stomach                            which did not allow further passage of the                            duodenoscope and so I could not perform the planned                            ERCP.                           - Mild yeast infection in his esophagus. Will order  diflucan. Moderate Sedation:      N/A- Per Anesthesia Care Recommendation:           - Return patient to hospital ward for ongoing care.                           - IR consultation for biliary drain and also                            percutaneous biopsy of abd soft tissue (in case the                            gastris biopsies are not diagnostic from this                            procedure).                           - Oncology consultation.                           - Diflucan for esophageal yeast infection. Procedure Code(s):        --- Professional ---                           [phone], Esophagogastroduodenoscopy, flexible,                            transoral; with biopsy, single or multiple Diagnosis Code(s):        --- Professional ---                           D49.0, Neoplasm of unspecified behavior of                            digestive system                           R93.3, Abnormal findings on diagnostic imaging of                            other parts of digestive tract CPT copyright 2017 American Medical Association. All rights reserved. The codes documented in this report are preliminary and upon coder review may  be revised to meet current compliance  requirements. Milus Banister, MD 05/28/2018 9:34:37 AM This report has been signed electronically. Number of Addenda: 0

## 2018-05-28 NOTE — Interval H&P Note (Signed)
History and Physical Interval Note:  05/28/2018 8:29 AM  [name]  has presented today for surgery, with the diagnosis of Abnormal CT scan, weight loss  The various methods of treatment have been discussed with the patient and family. After consideration of risks, benefits and other options for treatment, the patient has consented to  Procedure(s): ESOPHAGOGASTRODUODENOSCOPY (EGD) WITH PROPOFOL (Left) ENDOSCOPIC RETROGRADE CHOLANGIOPANCREATOGRAPHY (ERCP) (N/A) as a surgical intervention .  The patient's history has been reviewed, patient examined, no change in status, stable for surgery.  I have reviewed the patient's chart and labs.  Questions were answered to the patient's satisfaction.     Milus Banister

## 2018-05-29 ENCOUNTER — Inpatient Hospital Stay (HOSPITAL_COMMUNITY): Payer: 59

## 2018-05-29 ENCOUNTER — Encounter (HOSPITAL_COMMUNITY): Payer: Self-pay | Admitting: Physician Assistant

## 2018-05-29 LAB — COMPREHENSIVE METABOLIC PANEL
ALBUMIN: 3.2 g/dL — AB (ref 3.5–5.0)
ALT: 251 U/L — ABNORMAL HIGH (ref 17–63)
ANION GAP: 9 (ref 5–15)
AST: 254 U/L — ABNORMAL HIGH (ref 15–41)
Alkaline Phosphatase: 365 U/L — ABNORMAL HIGH (ref 38–126)
BUN: 7 mg/dL (ref 6–20)
CHLORIDE: 99 mmol/L — AB (ref 101–111)
CO2: 29 mmol/L (ref 22–32)
Calcium: 8.5 mg/dL — ABNORMAL LOW (ref 8.9–10.3)
Creatinine, Ser: 0.62 mg/dL (ref 0.61–1.24)
GFR calc non Af Amer: >60 mL/min (ref >60)
GLUCOSE: 171 mg/dL — AB (ref 65–99)
Potassium: 3.7 mmol/L (ref 3.5–5.1)
SODIUM: 137 mmol/L (ref 135–145)
Total Bilirubin: 8.5 mg/dL — ABNORMAL HIGH (ref 0.3–1.2)
Total Protein: 6.8 g/dL (ref 6.5–8.1)

## 2018-05-29 LAB — CBC
HCT: 38.2 % — ABNORMAL LOW (ref 39.0–52.0)
Hemoglobin: 12.6 g/dL — ABNORMAL LOW (ref 13.0–17.0)
MCH: 30.4 pg (ref 26.0–34.0)
MCHC: 33 g/dL (ref 30.0–36.0)
MCV: 92.3 fL (ref 78.0–100.0)
PLATELETS: 429 10*3/uL — AB (ref 150–400)
RBC: 4.14 MIL/uL — ABNORMAL LOW (ref 4.22–5.81)
RDW: 13.5 % (ref 11.5–15.5)
WBC: 10.6 10*3/uL — ABNORMAL HIGH (ref 4.0–10.5)

## 2018-05-29 MED ORDER — CENTRUM PO CHEW
1.0000 | CHEWABLE_TABLET | Freq: Every day | ORAL | Status: DC
  Administered 2018-05-30 – 2018-06-02 (×3): 1 via ORAL
  Filled 2018-05-29 (×8): qty 1

## 2018-05-29 MED ORDER — ONDANSETRON HCL 4 MG PO TABS
4.0000 mg | ORAL_TABLET | Freq: Three times a day (TID) | ORAL | Status: DC
  Administered 2018-05-29 – 2018-06-01 (×9): 4 mg via ORAL
  Filled 2018-05-29 (×10): qty 1

## 2018-05-29 MED ORDER — LIDOCAINE HCL 1 % IJ SOLN
INTRAMUSCULAR | Status: AC
  Filled 2018-05-29: qty 20

## 2018-05-29 MED ORDER — VITAMIN B-12 1000 MCG PO TABS
1000.0000 ug | ORAL_TABLET | Freq: Every day | ORAL | Status: DC
  Administered 2018-05-29 – 2018-06-03 (×6): 1000 ug via ORAL
  Filled 2018-05-29 (×6): qty 1

## 2018-05-29 MED ORDER — LORATADINE 10 MG PO TABS
10.0000 mg | ORAL_TABLET | Freq: Every day | ORAL | Status: DC
Start: 2018-05-29 — End: 2018-06-04
  Administered 2018-05-29 – 2018-06-03 (×6): 10 mg via ORAL
  Filled 2018-05-29 (×6): qty 1

## 2018-05-29 MED ORDER — FLINTSTONES COMPLETE 60 MG PO CHEW: 1.0000 | CHEWABLE_TABLET | Freq: Every day | ORAL | Status: DC

## 2018-05-29 NOTE — Progress Notes (Signed)
PROGRESS NOTE  [name] HYW:[id number] DOB: January 28, 1961 DOA: 05/27/2018 PCP: Ma Hillock, DO  HPI/Recap of past 24 hours:  [name] is a 57 y.o. male with medical history significant for squamous cell carcinoma, GERD, chronic constipation who presented to GI office yesterday, as a follow-up from a recent ER visit for complaints of abdominal fullness of 6 weeks duration. Seen by [facility] [place] (Dr. Ardis Hughs). Labs drawn there revealed significant transaminitis.  CT abdomen and pelvis revealed 3.3 cm masslike soft tissue in the pancreatic tail with proximal pancreatic ductal dilatation.  GI recommended admission due to abnormal CT scan showing malignancy with suspicion for carcinomatosis of unknown primary and abnormal labs.  Upon arrival at [facility] long hospital patient reports nausea with no vomiting and mid abdominal discomfort.  Started gentle IV fluid with pain management in place.  GI following and planning EGD +/- ERCP in the morning.  05/28/2018: Patient seen and examined at his bedside.  Post EGD with biopsies and percutaneous biliary drain by interventional radiology.  Tolerated procedures well.  Complains of abdominal pain and nausea.  EGD revealed stomach mass.  Oncology consulted.  Appreciate input.  05/29/18: Reports persistent abd pain and nausea. Requested scheduled po zofran for his nausea.    Assessment/Plan: Active Problems:   Abnormal CT scan, gastrointestinal tract   Elevated liver enzymes   Carcinoma (HCC)   Elevated LFTs  Newly diagnosed mass lesions affecting pancreas and stomach Post EGD on 05/28/2018 with biopsies Awaiting results of biopsies Oncology following GI following Post percutaneous biliary drain by interventional radiology Port a cath ordered- Paracentesis ordered by oncology  Acute transaminitis with suspicion for biliary obstruction LFTs trending up Monitor perc drainage Status post biliary drain by interventional radiology Repeat  chemistry panel in the morning  Hyperbilirubinemia/jaundice, improving Suspect secondary to biliary obstruction from mass versus orders Status post biliary drain Management as stated above T bili 8.5 from 9.4 Repeat CMP in the am  Intractable nausea Po zofran 4mg  TID IV zofran for breakthrough My consider IV reglan if persistent  GERD Continue PPI  Allergic rhinitis Continue Zyrtec.    Code Status: Full code  Family Communication: Father at bedside  Disposition Plan: Home when clinically stable   Consultants:  GI  Oncology  IR for perc biliary drainage and paracentesis  Procedures:  Percutaneous drain placement by interventional radiology 05/28/18  EGD 05/28/18  Failed ERCP on 05/28/18  Antimicrobials:  Received 1 dose of IV Zosyn for procedure prophylaxis  DVT prophylaxis: Subcu Lovenox daily   Objective: Vitals:   05/28/18 1200 05/28/18 1549 05/28/18 2100 05/29/18 0427  BP: (!) 129/100 118/69 132/88 125/81  Pulse: 64 75 75 76  Resp: 19 18 16 14   Temp:  97.8 F (36.6 C) 98 F (36.7 C) 98 F (36.7 C)  TempSrc:  Oral Oral Oral  SpO2: 99% 95% 97% 98%  Weight:      Height:        Intake/Output Summary (Last 24 hours) at 05/29/2018 1423 Last data filed at 05/29/2018 0315 Gross per 24 hour  Intake -  Output 1035 ml  Net -1035 ml   Filed Weights   05/27/18 1410 05/28/18 0758  Weight: 88.5 kg (195 lb) 88.5 kg (195 lb)    Exam:  . General: 57 y.o. year-old male WD wN. Appears uncomfortable due to nausea and abd pain. . Cardiovascular: RRR no rubs or gallops. No JVD or thyromegaly..   . Respiratory: Clear to auscultation with no wheezes  or rales. Good inspiratory effort. . Abdomen: Distended with hypoactive bowel sounds.  Mildly tender on palpation diffusely.  .  Musculoskeletal: No lower extremity edema. 2/4 pulses in all 4 extremities. . Skin: No ulcerative lesions noted or rashes . Psychiatry: Mood is appropriate for condition and  setting   Data Reviewed: CBC: Recent Labs  Lab 05/26/18 1548 05/27/18 1435 05/28/18 0352 05/29/18 0442  WBC 8.5 9.1 7.8 10.6*  NEUTROABS 5.7 6.4  --   --   HGB 14.2 13.1 12.4* 12.6*  HCT 41.7 38.9* 37.8* 38.2*  MCV 90.0 91.7 92.0 92.3  PLT 383.0 406* 409* 166*   Basic Metabolic Panel: Recent Labs  Lab 05/26/18 1548 05/27/18 1435 05/28/18 0352 05/29/18 0442  NA 135 137 137 137  K 4.0 3.9 3.9 3.7  CL 92* 94* 97* 99*  CO2 34* 32 33* 29  GLUCOSE 121* 117* 112* 171*  BUN 10 9 7 7   CREATININE 0.94 0.74 0.59* 0.62  CALCIUM 9.8 8.8* 8.5* 8.5*   GFR: Estimated Creatinine Clearance: 112.4 mL/min (by C-G formula based on SCr of 0.62 mg/dL). Liver Function Tests: Recent Labs  Lab 05/26/18 1548 05/27/18 1435 05/28/18 0352 05/29/18 0442  AST 245* 222* 196* 254*  ALT 296* 263* 234* 251*  ALKPHOS 418* 354* 317* 365*  BILITOT 8.3* 9.8* 9.4* 8.5*  PROT 8.1 7.5 6.9 6.8  ALBUMIN 4.4 3.5 3.3* 3.2*   Recent Labs  Lab 05/26/18 1548 05/27/18 1435  LIPASE 30.0 32   No results for input(s): AMMONIA in the last 168 hours. Coagulation Profile: Recent Labs  Lab 05/26/18 1548  INR 1.2*   Cardiac Enzymes: No results for input(s): CKTOTAL, CKMB, CKMBINDEX, TROPONINI in the last 168 hours. BNP (last 3 results) No results for input(s): PROBNP in the last 8760 hours. HbA1C: No results for input(s): HGBA1C in the last 72 hours. CBG: No results for input(s): GLUCAP in the last 168 hours. Lipid Profile: No results for input(s): CHOL, HDL, LDLCALC, TRIG, CHOLHDL, LDLDIRECT in the last 72 hours. Thyroid Function Tests: No results for input(s): TSH, T4TOTAL, FREET4, T3FREE, THYROIDAB in the last 72 hours. Anemia Panel: No results for input(s): VITAMINB12, FOLATE, FERRITIN, TIBC, IRON, RETICCTPCT in the last 72 hours. Urine analysis:    Component Value Date/Time   COLORURINE YELLOW 05/19/2018 [place] 05/19/2018 1259   LABSPEC <1.005 (L) 05/19/2018 1259    PHURINE 7.0 05/19/2018 1259   GLUCOSEU NEGATIVE 05/19/2018 1259   HGBUR NEGATIVE 05/19/2018 1259   HGBUR negative 09/21/2009 1223   BILIRUBINUR NEGATIVE 05/19/2018 1259   BILIRUBINUR n 04/03/2015 1041   KETONESUR 15 (A) 05/19/2018 1259   PROTEINUR NEGATIVE 05/19/2018 1259   UROBILINOGEN 0.2 04/03/2015 1041   UROBILINOGEN 0.2 09/21/2009 1223   NITRITE NEGATIVE 05/19/2018 1259   LEUKOCYTESUR NEGATIVE 05/19/2018 1259   Sepsis Labs: @LABRCNTIP (procalcitonin:4,lacticidven:4)  )No results found for this or any previous visit (from the past 240 hour(s)).    Studies: No results found.  Scheduled Meds: . enoxaparin (LOVENOX) injection  40 mg Subcutaneous Q24H  . fentaNYL  25 mcg Transdermal Q72H  . fluconazole  200 mg Oral Daily  . indomethacin  100 mg Rectal Once  . lidocaine      . loratadine  10 mg Oral Daily  . multivitamin-iron-minerals-folic acid  1 tablet Oral Daily  . naloxegol oxalate  12.5 mg Oral Daily  . ondansetron  4 mg Oral TID  . pantoprazole  40 mg Oral BID  . polyethylene glycol  17  g Oral Daily  . sodium chloride flush  5 mL Intracatheter Q8H  . sucralfate  1 g Oral TID WC & HS  . vitamin B-12  1,000 mcg Oral Daily    Continuous Infusions: . sodium chloride 75 mL/hr at 05/29/18 1007     LOS: 2 days     Kayleen Memos, MD [facility] Pager [phone]  If 7PM-7AM, please contact night-coverage [email or web address] Password [facility] 05/29/2018, 2:23 PM

## 2018-05-29 NOTE — Progress Notes (Signed)
IR requested by Dr. Marin Olp for possible image-guided paracentesis.  Was present for limited abdominal ultrasound which revealed little to no fluid that could be safely accessed with paracentesis.  Explained this to patient and that we will not be preforming procedure today.  All questions answered and concerns addressed. Patient coveys understanding and agrees with plan.  Bea Graff Riya Huxford, PA-C 05/29/2018, 2:24 PM

## 2018-05-29 NOTE — H&P (Signed)
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  Risks and benefits of image guided port-a-catheter placement was discussed with the patient including, but not limited to bleeding, infection, pneumothorax, or fibrin sheath development and need for additional procedures.  All of the patient's questions were answered, patient is agreeable to proceed. Consent signed and in chart.  Thank you for this interesting consult.  I greatly enjoyed meeting [name] and look forward to participating in their care.  A copy of this report was sent to the requesting provider on this date.  Electronically Signed: Murrell Redden, PA-C   05/29/2018, 2:13 PM      I spent a total of 40 Minutes in face to face in clinical consultation, greater than 50% of which was counseling/coordinating care for [facility].

## 2018-05-29 NOTE — Progress Notes (Signed)
Subjective: Feeling better.  Objective: Vital signs in last 24 hours: Temp:  [97.8 F (36.6 C)-98.5 F (36.9 C)] 98 F (36.7 C) (06/15 0427) Pulse Rate:  [61-76] 76 (06/15 0427) Resp:  [10-26] 14 (06/15 0427) BP: (118-144)/(69-100) 125/81 (06/15 0427) SpO2:  [95 %-100 %] 98 % (06/15 0427) Last BM Date: 05/18/18  Intake/Output from previous day: 06/14 0701 - 06/15 0700 In: 412 [I.V.:412] Out: 1035 [Urine:1035] Intake/Output this shift: No intake/output data recorded.  General appearance: alert, icteric and no distress GI: soft, non-tender; bowel sounds normal; no masses,  no organomegaly and multiple abdominal scars  Lab Results: Recent Labs    05/27/18 1435 05/28/18 0352 05/29/18 0442  WBC 9.1 7.8 10.6*  HGB 13.1 12.4* 12.6*  HCT 38.9* 37.8* 38.2*  PLT 406* 409* 429*   BMET Recent Labs    05/27/18 1435 05/28/18 0352 05/29/18 0442  NA 137 137 137  K 3.9 3.9 3.7  CL 94* 97* 99*  CO2 32 33* 29  GLUCOSE 117* 112* 171*  BUN _0 CREATININE 0.74 0.59* 0.62  CALCIUM 8.8* 8.5* 8.5*   LFT Recent Labs    05/29/18 0442  PROT 6.8  ALBUMIN 3.2*  AST 254*  ALT 251*  ALKPHOS 365*  BILITOT 8.5*   PT/INR Recent Labs    05/26/18 1548  LABPROT 13.9*  INR 1.2*   Hepatitis Panel No results for input(s): HEPBSAG, HCVAB, HEPAIGM, HEPBIGM in the last 72 hours. C-Diff No results for input(s): CDIFFTOX in the last 72 hours. Fecal Lactopherrin No results for input(s): FECLLACTOFRN in the last 72 hours.  Studies/Results: Ct Chest W Contrast  Result Date: 05/28/2018 CLINICAL DATA:  57 year old male with several weeks of abdominal discomfort. Mass involving the pancreas and stomach. Followup study. EXAM: CT CHEST, ABDOMEN, AND PELVIS WITH CONTRAST TECHNIQUE: Multidetector CT imaging of the chest, abdomen and pelvis was performed following the standard protocol during bolus administration of intravenous contrast. CONTRAST:  175m ISOVUE-300 IOPAMIDOL (ISOVUE-300)  INJECTION 61%, 368mISOVUE-300 IOPAMIDOL (ISOVUE-300) INJECTION 61% COMPARISON:  CT the abdomen and pelvis 05/19/2018. FINDINGS: CT CHEST FINDINGS Cardiovascular: Heart size is normal. There is no significant pericardial fluid, thickening or pericardial calcification. Mild atherosclerosis in the great vessels. No coronary artery calcifications. Mediastinum/Nodes: Some of the loculated fluid components or cystic neoplasm from the upper abdomen (discussed below) extends cephalad via the esophageal hiatus into the lower middle mediastinum to the right of the distal esophagus. No pathologically enlarged mediastinal or hilar lymph nodes are noted. Esophagus is unremarkable in appearance. No axillary lymphadenopathy. Lungs/Pleura: Small left-sided Bochdalek's hernia incidentally noted. No suspicious appearing pulmonary nodules or masses are noted. No acute consolidative airspace disease. A few areas of mild linear scarring are noted. No pleural effusions. Musculoskeletal: Multiple old healed bilateral rib fractures with significant posttraumatic deformity of the thoracic cage. There are no aggressive appearing lytic or blastic lesions noted in the visualized portions of the skeleton. CT ABDOMEN PELVIS FINDINGS Hepatobiliary: In segment 2 of the liver there is a well-defined 1.2 cm low to intermediate attenuation lesion which is incompletely characterized. No other intrahepatic lesion is confidently identified on today's examination. There is extensive scalloping of the surface of the liver which appears to be related to multiple serosal implants, the largest of which is overlying the right lobe of the liver where a low-attenuation implant or loculated fluid collection measures approximately 4.6 x 6.8 cm (axial image 51 of series 2). Mild intrahepatic biliary ductal dilatation. Common bile duct is poorly  demonstrated. Status post cholecystectomy. Pancreas: In the tail of the pancreas there is a poorly defined mass which  extends laterally and cephalad coming in contact with the adjacent left hemidiaphragm (which it appears to invade), as well as the lateral left upper abdominal wall and the lateral surface of the cardia and proximal body of the stomach. This mass measures approximately 5.5 x 6.4 x 5.2 cm (axial image 44 of series 2 and coronal image 98 of series 8). The remainder of the body and head of the pancreas are otherwise generally unremarkable in appearance. Spleen: Status post splenectomy. Adrenals/Urinary Tract: Bilateral kidneys and bilateral adrenal glands are normal in appearance. No hydroureteronephrosis. Urinary bladder is normal in appearance. Stomach/Bowel: There is a poorly defined infiltrative mass-like area in the proximal aspect of the stomach along the greater curvature which is estimated to measure approximately 11.5 x 4.1 x 6.0 cm (axial image 43 of series 2 and coronal image 77 of series 8). Several other more cystic appearing areas are also noted associated with the gastric wall both along the greater curvature, the inferior surface of the stomach, and along the lesser curvature in the antral pre-pyloric region. No pathologic dilatation of small bowel or colon. Appendix is not confidently identified. Vascular/Lymphatic: Aortic atherosclerosis, without evidence of aneurysm or dissection in the abdominal or pelvic vasculature. Several prominent borderline enlarged and mildly enlarged retroperitoneal lymph nodes are noted measuring up to 1 cm in short axis in the left para-aortic nodal station (axial image 66 of series 2). In the upper abdomen in the expected location of portacaval, hepatic duodenal and celiac axis nodal stations there are large low-attenuation areas which have a cystic appearance, but could reflect necrotic lymphadenopathy. Some of this extends into the lower middle mediastinum (discussed above). While lymphadenopathy is not excluded, at this time these are favored to reflect malignant  mucinous/cystic metastatic deposits Reproductive: Prostate gland and seminal vesicles are unremarkable in appearance. Other: Trace volume of ascites. Multifocal peritoneal nodularity noted, concerning for widespread intraperitoneal metastatic disease. Musculoskeletal: There are no aggressive appearing lytic or blastic lesions noted in the visualized portions of the skeleton. IMPRESSION: 1. Widespread intraperitoneal metastasis, as detailed above. The origin of this neoplasm is uncertain, and could either be from a large infiltrative mass in the tail of the pancreas, which has subsequently invaded the proximal stomach, or a large gastric neoplasm which extended into a splenectomy bed now involving the tail of the pancreas. Tissue sampling is recommended to establish a definitive diagnosis. 2. No definite evidence of intrapulmonary metastasis. 3. There does appear to be direct spread of disease through the esophageal hiatus into the lower middle mediastinum. 4. Additional incidental findings, as above. Electronically Signed   By: Vinnie Langton M.D.   On: 05/28/2018 07:25   Ct Abdomen Pelvis W Contrast  Result Date: 05/28/2018 CLINICAL DATA:  57 year old male with several weeks of abdominal discomfort. Mass involving the pancreas and stomach. Followup study. EXAM: CT CHEST, ABDOMEN, AND PELVIS WITH CONTRAST TECHNIQUE: Multidetector CT imaging of the chest, abdomen and pelvis was performed following the standard protocol during bolus administration of intravenous contrast. CONTRAST:  183m ISOVUE-300 IOPAMIDOL (ISOVUE-300) INJECTION 61%, 322mISOVUE-300 IOPAMIDOL (ISOVUE-300) INJECTION 61% COMPARISON:  CT the abdomen and pelvis 05/19/2018. FINDINGS: CT CHEST FINDINGS Cardiovascular: Heart size is normal. There is no significant pericardial fluid, thickening or pericardial calcification. Mild atherosclerosis in the great vessels. No coronary artery calcifications. Mediastinum/Nodes: Some of the loculated fluid  components or cystic neoplasm from the upper  abdomen (discussed below) extends cephalad via the esophageal hiatus into the lower middle mediastinum to the right of the distal esophagus. No pathologically enlarged mediastinal or hilar lymph nodes are noted. Esophagus is unremarkable in appearance. No axillary lymphadenopathy. Lungs/Pleura: Small left-sided Bochdalek's hernia incidentally noted. No suspicious appearing pulmonary nodules or masses are noted. No acute consolidative airspace disease. A few areas of mild linear scarring are noted. No pleural effusions. Musculoskeletal: Multiple old healed bilateral rib fractures with significant posttraumatic deformity of the thoracic cage. There are no aggressive appearing lytic or blastic lesions noted in the visualized portions of the skeleton. CT ABDOMEN PELVIS FINDINGS Hepatobiliary: In segment 2 of the liver there is a well-defined 1.2 cm low to intermediate attenuation lesion which is incompletely characterized. No other intrahepatic lesion is confidently identified on today's examination. There is extensive scalloping of the surface of the liver which appears to be related to multiple serosal implants, the largest of which is overlying the right lobe of the liver where a low-attenuation implant or loculated fluid collection measures approximately 4.6 x 6.8 cm (axial image 51 of series 2). Mild intrahepatic biliary ductal dilatation. Common bile duct is poorly demonstrated. Status post cholecystectomy. Pancreas: In the tail of the pancreas there is a poorly defined mass which extends laterally and cephalad coming in contact with the adjacent left hemidiaphragm (which it appears to invade), as well as the lateral left upper abdominal wall and the lateral surface of the cardia and proximal body of the stomach. This mass measures approximately 5.5 x 6.4 x 5.2 cm (axial image 44 of series 2 and coronal image 98 of series 8). The remainder of the body and head of the  pancreas are otherwise generally unremarkable in appearance. Spleen: Status post splenectomy. Adrenals/Urinary Tract: Bilateral kidneys and bilateral adrenal glands are normal in appearance. No hydroureteronephrosis. Urinary bladder is normal in appearance. Stomach/Bowel: There is a poorly defined infiltrative mass-like area in the proximal aspect of the stomach along the greater curvature which is estimated to measure approximately 11.5 x 4.1 x 6.0 cm (axial image 43 of series 2 and coronal image 77 of series 8). Several other more cystic appearing areas are also noted associated with the gastric wall both along the greater curvature, the inferior surface of the stomach, and along the lesser curvature in the antral pre-pyloric region. No pathologic dilatation of small bowel or colon. Appendix is not confidently identified. Vascular/Lymphatic: Aortic atherosclerosis, without evidence of aneurysm or dissection in the abdominal or pelvic vasculature. Several prominent borderline enlarged and mildly enlarged retroperitoneal lymph nodes are noted measuring up to 1 cm in short axis in the left para-aortic nodal station (axial image 66 of series 2). In the upper abdomen in the expected location of portacaval, hepatic duodenal and celiac axis nodal stations there are large low-attenuation areas which have a cystic appearance, but could reflect necrotic lymphadenopathy. Some of this extends into the lower middle mediastinum (discussed above). While lymphadenopathy is not excluded, at this time these are favored to reflect malignant mucinous/cystic metastatic deposits Reproductive: Prostate gland and seminal vesicles are unremarkable in appearance. Other: Trace volume of ascites. Multifocal peritoneal nodularity noted, concerning for widespread intraperitoneal metastatic disease. Musculoskeletal: There are no aggressive appearing lytic or blastic lesions noted in the visualized portions of the skeleton. IMPRESSION: 1.  Widespread intraperitoneal metastasis, as detailed above. The origin of this neoplasm is uncertain, and could either be from a large infiltrative mass in the tail of the pancreas, which has subsequently invaded  the proximal stomach, or a large gastric neoplasm which extended into a splenectomy bed now involving the tail of the pancreas. Tissue sampling is recommended to establish a definitive diagnosis. 2. No definite evidence of intrapulmonary metastasis. 3. There does appear to be direct spread of disease through the esophageal hiatus into the lower middle mediastinum. 4. Additional incidental findings, as above. Electronically Signed   By: Vinnie Langton M.D.   On: 05/28/2018 07:25   Ir Percutaneous Transhepatic Cholangiogram  Result Date: 05/28/2018 INDICATION: Malignant obstructive jaundice, unsuccessful ERCP stent placement EXAM: FLUOROSCOPIC RIGHT INTERNAL EXTERNAL 10 FRENCH BILIARY DRAIN MEDICATIONS: 3.375 G ZOSYN; The antibiotic was administered within an appropriate time frame prior to the initiation of the procedure. ANESTHESIA/SEDATION: Moderate (conscious) sedation was employed during this procedure. A total of Versed 3.0 mg and Fentanyl 150 mcg was administered intravenously. Moderate Sedation Time: 18 minutes. The patient's level of consciousness and vital signs were monitored continuously by radiology nursing throughout the procedure under my direct supervision. FLUOROSCOPY TIME:  Fluoroscopy Time: 2 minutes 30 seconds (140 mGy). COMPLICATIONS: None immediate. PROCEDURE: Informed written consent was obtained from the patient after a thorough discussion of the procedural risks, benefits and alternatives. All questions were addressed. Maximal Sterile Barrier Technique was utilized including caps, mask, sterile gowns, sterile gloves, sterile drape, hand hygiene and skin antiseptic. A timeout was performed prior to the initiation of the procedure. Under sterile conditions and local anesthesia,  percutaneous ultrasound access performed of a peripheral dilated right hepatic duct. Images obtained for documentation. There was return of bile. Contrast injection performed for initial cholangiogram demonstrating mild biliary dilatation. Guidewire advanced centrally within the biliary tree followed by the Accustick dilator set. Kumpe catheter and guidewire were advanced into the duodenum. Contrast injection confirms position in the duodenum. Amplatz guidewire inserted. Tract dilatation performed to insert a 10 [place] drain. Retention loop formed in the duodenum. Contrast injection confirms drainage of the bile ducts. Catheter secured with a Prolene suture. External gravity drainage bag connected. No immediate complication. Patient tolerated the procedure well. IMPRESSION: Successful ultrasound 10 French right internal external biliary drain. Electronically Signed   By: Jerilynn Mages.  Shick M.D.   On: 05/28/2018 17:07    Medications:  Scheduled: . enoxaparin (LOVENOX) injection  40 mg Subcutaneous Q24H  . fentaNYL  25 mcg Transdermal Q72H  . fluconazole  200 mg Oral Daily  . indomethacin  100 mg Rectal Once  . naloxegol oxalate  12.5 mg Oral Daily  . pantoprazole  40 mg Oral BID  . polyethylene glycol  17 g Oral Daily  . sodium chloride flush  5 mL Intracatheter Q8H  . sucralfate  1 g Oral TID WC & HS   Continuous: . sodium chloride 75 mL/hr at 05/27/18 2006    Assessment/Plan: 1) Metastatic malignancy - ? source. 2) Biliary obstruction.   The patient reported that he needed to manipulate his PTC.  After his manipulation he had significant biliary drainage.  The bile is clear and thin when observed in the drainage bag.  He feels much better with the drainage as there is a decrease in pressure in his RUQ.  This also explains why his liver panel did not improve.  Plan: 1) Monitor bile drainage. 2) Check liver panel in the AM.  LOS: 2 days   [name] D 05/29/2018, 8:32 AM

## 2018-05-30 DIAGNOSIS — C799 Secondary malignant neoplasm of unspecified site: Secondary | ICD-10-CM

## 2018-05-30 DIAGNOSIS — C259 Malignant neoplasm of pancreas, unspecified: Secondary | ICD-10-CM

## 2018-05-30 LAB — COMPREHENSIVE METABOLIC PANEL
ALBUMIN: 3 g/dL — AB (ref 3.5–5.0)
ALT: 257 U/L — AB (ref 17–63)
AST: 246 U/L — AB (ref 15–41)
Alkaline Phosphatase: 388 U/L — ABNORMAL HIGH (ref 38–126)
Anion gap: 8 (ref 5–15)
BUN: 6 mg/dL (ref 6–20)
CO2: 30 mmol/L (ref 22–32)
CREATININE: 0.53 mg/dL — AB (ref 0.61–1.24)
Calcium: 8.4 mg/dL — ABNORMAL LOW (ref 8.9–10.3)
Chloride: 100 mmol/L — ABNORMAL LOW (ref 101–111)
GFR calc Af Amer: >60 mL/min (ref >60)
GFR calc non Af Amer: >60 mL/min (ref >60)
Glucose, Bld: 128 mg/dL — ABNORMAL HIGH (ref 65–99)
POTASSIUM: 3.8 mmol/L (ref 3.5–5.1)
SODIUM: 138 mmol/L (ref 135–145)
Total Bilirubin: 8.8 mg/dL — ABNORMAL HIGH (ref 0.3–1.2)
Total Protein: 6.6 g/dL (ref 6.5–8.1)

## 2018-05-30 MED ORDER — BISACODYL 5 MG PO TBEC: 5.0000 mg | DELAYED_RELEASE_TABLET | Freq: Every day | ORAL | Status: DC | PRN

## 2018-05-30 MED ORDER — POLYETHYLENE GLYCOL 3350 17 G PO PACK
17.0000 g | PACK | Freq: Three times a day (TID) | ORAL | Status: DC
  Administered 2018-05-30 – 2018-06-03 (×7): 17 g via ORAL
  Filled 2018-05-30 (×10): qty 1

## 2018-05-30 MED ORDER — SENNOSIDES-DOCUSATE SODIUM 8.6-50 MG PO TABS
1.0000 | ORAL_TABLET | Freq: Two times a day (BID) | ORAL | Status: DC
  Administered 2018-05-30 – 2018-06-01 (×5): 1 via ORAL
  Filled 2018-05-30 (×7): qty 1

## 2018-05-30 NOTE — Progress Notes (Signed)
Progress Note for [facility]  Subjective: He complains about constipation.  His last bowel movement was on June 4th.  He feels uncomfortable.  Objective: Vital signs in last 24 hours: Temp:  [98.2 F (36.8 C)-98.4 F (36.9 C)] 98.4 F (36.9 C) (06/16 0505) Pulse Rate:  [64-69] 64 (06/16 0505) Resp:  [13-18] 13 (06/16 0505) BP: (112-137)/(68-88) 112/68 (06/16 0505) SpO2:  [97 %-99 %] 97 % (06/16 0505) Last BM Date: (was told 14th by day shift. Pt. just now telling me 6/4.)  Intake/Output from previous day: 06/15 0701 - 06/16 0700 In: 1505 [P.O.:880; I.V.:620] Out: 275 [Drains:275] Intake/Output this shift: No intake/output data recorded.  General appearance: alert and no distress GI: soft, non-tender; bowel sounds normal; no masses,  no organomegaly  Lab Results: Recent Labs    05/27/18 1435 05/28/18 0352 05/29/18 0442  WBC 9.1 7.8 10.6*  HGB 13.1 12.4* 12.6*  HCT 38.9* 37.8* 38.2*  PLT 406* 409* 429*   BMET Recent Labs    05/28/18 0352 05/29/18 0442 05/30/18 0440  NA 137 137 138  K 3.9 3.7 3.8  CL 97* 99* 100*  CO2 33* 29 30  GLUCOSE 112* 171* 128*  BUN 7 7 6  CREATININE 0.59* 0.62 0.53*  CALCIUM 8.5* 8.5* 8.4*   LFT Recent Labs    05/30/18 0440  PROT 6.6  ALBUMIN 3.0*  AST 246*  ALT 257*  ALKPHOS 388*  BILITOT 8.8*   PT/INR No results for input(s): LABPROT, INR in the last 72 hours. Hepatitis Panel No results for input(s): HEPBSAG, HCVAB, HEPAIGM, HEPBIGM in the last 72 hours. C-Diff No results for input(s): CDIFFTOX in the last 72 hours. Fecal Lactopherrin No results for input(s): FECLLACTOFRN in the last 72 hours.  Studies/Results: Us Abdomen Limited  Result Date: 05/29/2018 CLINICAL DATA:  56-year-old male with a history of questionable ascites. EXAM: LIMITED ABDOMEN ULTRASOUND FOR ASCITES TECHNIQUE: Limited ultrasound survey for ascites was performed in all four abdominal quadrants. COMPARISON:  None. FINDINGS: Limited ultrasound survey  of the abdomen demonstrates no significant ascites. IMPRESSION: Sonographic survey demonstrates no significant ascites. Electronically Signed   By: Jaime  Wagner D.O.   On: 05/29/2018 14:33   Ir Percutaneous Transhepatic Cholangiogram  Result Date: 05/28/2018 INDICATION: Malignant obstructive jaundice, unsuccessful ERCP stent placement EXAM: FLUOROSCOPIC RIGHT INTERNAL EXTERNAL 10 FRENCH BILIARY DRAIN MEDICATIONS: 3.375 G ZOSYN; The antibiotic was administered within an appropriate time frame prior to the initiation of the procedure. ANESTHESIA/SEDATION: Moderate (conscious) sedation was employed during this procedure. A total of Versed 3.0 mg and Fentanyl 150 mcg was administered intravenously. Moderate Sedation Time: 18 minutes. The patient's level of consciousness and vital signs were monitored continuously by radiology nursing throughout the procedure under my direct supervision. FLUOROSCOPY TIME:  Fluoroscopy Time: 2 minutes 30 seconds (140 mGy). COMPLICATIONS: None immediate. PROCEDURE: Informed written consent was obtained from the patient after a thorough discussion of the procedural risks, benefits and alternatives. All questions were addressed. Maximal Sterile Barrier Technique was utilized including caps, mask, sterile gowns, sterile gloves, sterile drape, hand hygiene and skin antiseptic. A timeout was performed prior to the initiation of the procedure. Under sterile conditions and local anesthesia, percutaneous ultrasound access performed of a peripheral dilated right hepatic duct. Images obtained for documentation. There was return of bile. Contrast injection performed for initial cholangiogram demonstrating mild biliary dilatation. Guidewire advanced centrally within the biliary tree followed by the Accustick dilator set. Kumpe catheter and guidewire were advanced into the duodenum. Contrast injection confirms position   in the duodenum. Amplatz guidewire inserted. Tract dilatation performed to  insert a 10 [place] drain. Retention loop formed in the duodenum. Contrast injection confirms drainage of the bile ducts. Catheter secured with a Prolene suture. External gravity drainage bag connected. No immediate complication. Patient tolerated the procedure well. IMPRESSION: Successful ultrasound 10 French right internal external biliary drain. Electronically Signed   By: Jerilynn Mages.  Shick M.D.   On: 05/28/2018 17:07    Medications:  Scheduled: . fentaNYL  25 mcg Transdermal Q72H  . fluconazole  200 mg Oral Daily  . indomethacin  100 mg Rectal Once  . loratadine  10 mg Oral Daily  . multivitamin-iron-minerals-folic acid  1 tablet Oral Daily  . naloxegol oxalate  12.5 mg Oral Daily  . ondansetron  4 mg Oral TID  . pantoprazole  40 mg Oral BID  . polyethylene glycol  17 g Oral TID  . sodium chloride flush  5 mL Intracatheter Q8H  . sucralfate  1 g Oral TID WC & HS  . vitamin B-12  1,000 mcg Oral Daily   Continuous: . sodium chloride 75 mL/hr at 05/30/18 0412    Assessment/Plan: 1) Biliary obstruction. 2) Constipation. 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   Nursing only documents 275 ml from the PTC drain, however, the patient reports that he has had excellent drainage.  His liver panel has not improved.  In fact, it is slightly worse.  His main concern at this time is constipation.  The narcotic medications has prevented him from having any bowel movements.  Plan: 1) Increase Miralax to 17 grams TID. 2) Minimize narcotic medications if possible. 3) Stringent recording of the PTC drain to obtain accurate levels.  LOS: 3 days   Teaira Croft D 05/30/2018, 7:36 AM

## 2018-05-30 NOTE — Progress Notes (Signed)
PROGRESS NOTE  [name]:[id number] DOB: 04-Aug-1961 DOA: 05/27/2018 PCP: Ma Hillock, DO  HPI/Recap of past 24 hours:  [name] is a 57 y.o. male with medical history significant for squamous cell carcinoma, GERD, chronic constipation who presented to GI office yesterday, as a follow-up from a recent ER visit for complaints of abdominal fullness of 6 weeks duration. Seen by [facility] [place] (Dr. Ardis Hughs). Labs drawn there revealed significant transaminitis.  CT abdomen and pelvis revealed 3.3 cm masslike soft tissue in the pancreatic tail with proximal pancreatic ductal dilatation.  GI recommended admission due to abnormal CT scan showing malignancy with suspicion for carcinomatosis of unknown primary and abnormal labs.  Upon arrival at [facility] long hospital patient reports nausea with no vomiting and mid abdominal discomfort.  Started gentle IV fluid with pain management in place.  GI following and planning EGD +/- ERCP in the morning.  05/28/2018: Patient seen and examined at his bedside.  Post EGD with biopsies and percutaneous biliary drain by interventional radiology.  Tolerated procedures well.  Complains of abdominal pain and nausea.  EGD revealed stomach mass.  Oncology consulted.  Appreciate input.  05/29/18: Reports persistent abd pain and nausea. Requested scheduled po zofran for his nausea.  05/30/2018: Patient seen and examined with son at bedside.  Complains of constipation.  MiraLAX was increased by GI to 3 times a day.  Also on Senokot twice daily, Dulcolax daily as needed. Soap suds enema plan today.  Nausea is well controlled on current management.  Assessment/Plan: Active Problems:   Abnormal CT scan, gastrointestinal tract   Elevated liver enzymes   Carcinoma (HCC)   Elevated LFTs  Newly diagnosed mass lesions affecting pancreas and stomach Post EGD on 05/28/2018 with biopsies Awaiting results of biopsies Oncology following GI following Post percutaneous  biliary drain by interventional radiology Port a cath ordered- Paracentesis ordered by oncology.  Not enough ascites.  Not performed.  Worsening acute transaminitis with suspicion for biliary obstruction LFTs trending up Continue to monitor percutaneous drainage Status post biliary drain by interventional radiology Repeat LFTs in the morning  Chronic constipation Currently on MiraLAX 3 times daily Senokot 1 tab twice daily Dulcolax daily as needed Soapsuds enema plan today 05/30/2018  Hyperbilirubinemia/jaundice Suspect secondary to biliary obstruction from mass versus orders Status post biliary drain Management as stated above  Intractable nausea, stable  Po zofran 4mg  TID IV zofran for breakthrough My consider IV reglan if persistent  GERD Continue PPI  Allergic rhinitis Continue Zyrtec.    Code Status: Full code  Family Communication: Father at bedside  Disposition Plan: Home when clinically stable   Consultants:  GI  Oncology  IR for perc biliary drainage and paracentesis  Procedures:  Percutaneous drain placement by interventional radiology 05/28/18  EGD 05/28/18  Failed ERCP on 05/28/18  Antimicrobials:  Received 1 dose of IV Zosyn for procedure prophylaxis  DVT prophylaxis: Subcu Lovenox daily   Objective: Vitals:   05/29/18 0427 05/29/18 1537 05/29/18 2018 05/30/18 0505  BP: 125/81 132/86 137/88 112/68  Pulse: 76 67 69 64  Resp: 14 16 18 13   Temp: 98 F (36.7 C) 98.3 F (36.8 C) 98.2 F (36.8 C) 98.4 F (36.9 C)  TempSrc: Oral Oral Oral   SpO2: 98% 99% 97% 97%  Weight:      Height:        Intake/Output Summary (Last 24 hours) at 05/30/2018 1242 Last data filed at 05/30/2018 0500 Gross per 24 hour  Intake  805 ml  Output 275 ml  Net 530 ml   Filed Weights   05/27/18 1410 05/28/18 0758  Weight: 88.5 kg (195 lb) 88.5 kg (195 lb)    Exam:  . General: 57 y.o. year-old male 67 well-nourished in no acute distress.  Alert and  oriented x3. . Cardiovascular: Rhythm with no rubs or gallops.  No JVD or thyromegaly noted.   Marland Kitchen Respiratory: Clear to auscultation with no wheezes or rales. Good inspiratory effort. . Abdomen: Distended with hypoactive bowel sounds.  Mildly tender on palpation diffusely.  .  Musculoskeletal: No lower extremity edema. 2/4 pulses in all 4 extremities. . Skin: No ulcerative lesions noted or rashes . Psychiatry: Mood is appropriate for condition and setting   Data Reviewed: CBC: Recent Labs  Lab 05/26/18 1548 05/27/18 1435 05/28/18 0352 05/29/18 0442  WBC 8.5 9.1 7.8 10.6*  NEUTROABS 5.7 6.4  --   --   HGB 14.2 13.1 12.4* 12.6*  HCT 41.7 38.9* 37.8* 38.2*  MCV 90.0 91.7 92.0 92.3  PLT 383.0 406* 409* 893*   Basic Metabolic Panel: Recent Labs  Lab 05/26/18 1548 05/27/18 1435 05/28/18 0352 05/29/18 0442 05/30/18 0440  NA 135 137 137 137 138  K 4.0 3.9 3.9 3.7 3.8  CL 92* 94* 97* 99* 100*  CO2 34* 32 33* 29 30  GLUCOSE 121* 117* 112* 171* 128*  BUN 10 9 7 7 6   CREATININE 0.94 0.74 0.59* 0.62 0.53*  CALCIUM 9.8 8.8* 8.5* 8.5* 8.4*   GFR: Estimated Creatinine Clearance: 112.4 mL/min (A) (by C-G formula based on SCr of 0.53 mg/dL (L)). Liver Function Tests: Recent Labs  Lab 05/26/18 1548 05/27/18 1435 05/28/18 0352 05/29/18 0442 05/30/18 0440  AST 245* 222* 196* 254* 246*  ALT 296* 263* 234* 251* 257*  ALKPHOS 418* 354* 317* 365* 388*  BILITOT 8.3* 9.8* 9.4* 8.5* 8.8*  PROT 8.1 7.5 6.9 6.8 6.6  ALBUMIN 4.4 3.5 3.3* 3.2* 3.0*   Recent Labs  Lab 05/26/18 1548 05/27/18 1435  LIPASE 30.0 32   No results for input(s): AMMONIA in the last 168 hours. Coagulation Profile: Recent Labs  Lab 05/26/18 1548  INR 1.2*   Cardiac Enzymes: No results for input(s): CKTOTAL, CKMB, CKMBINDEX, TROPONINI in the last 168 hours. BNP (last 3 results) No results for input(s): PROBNP in the last 8760 hours. HbA1C: No results for input(s): HGBA1C in the last 72 hours. CBG: No  results for input(s): GLUCAP in the last 168 hours. Lipid Profile: No results for input(s): CHOL, HDL, LDLCALC, TRIG, CHOLHDL, LDLDIRECT in the last 72 hours. Thyroid Function Tests: No results for input(s): TSH, T4TOTAL, FREET4, T3FREE, THYROIDAB in the last 72 hours. Anemia Panel: No results for input(s): VITAMINB12, FOLATE, FERRITIN, TIBC, IRON, RETICCTPCT in the last 72 hours. Urine analysis:    Component Value Date/Time   COLORURINE YELLOW 05/19/2018 Frank 05/19/2018 1259   LABSPEC <1.005 (L) 05/19/2018 1259   PHURINE 7.0 05/19/2018 1259   GLUCOSEU NEGATIVE 05/19/2018 1259   HGBUR NEGATIVE 05/19/2018 1259   HGBUR negative 09/21/2009 1223   BILIRUBINUR NEGATIVE 05/19/2018 1259   BILIRUBINUR n 04/03/2015 1041   KETONESUR 15 (A) 05/19/2018 1259   PROTEINUR NEGATIVE 05/19/2018 1259   UROBILINOGEN 0.2 04/03/2015 1041   UROBILINOGEN 0.2 09/21/2009 1223   NITRITE NEGATIVE 05/19/2018 1259   LEUKOCYTESUR NEGATIVE 05/19/2018 1259   Sepsis Labs: @LABRCNTIP (procalcitonin:4,lacticidven:4)  )No results found for this or any previous visit (from the past 240 hour(s)).  Studies: US Abdomen Limited  Result Date: 05/29/2018 CLINICAL DATA:  57 year old male with a history of questionable ascites. EXAM: LIMITED ABDOMEN ULTRASOUND FOR ASCITES TECHNIQUE: Limited ultrasound survey for ascites was performed in all four abdominal quadrants. COMPARISON:  None. FINDINGS: Limited ultrasound survey of the abdomen demonstrates no significant ascites. IMPRESSION: Sonographic survey demonstrates no significant ascites. Electronically Signed   By: Corrie Mckusick D.O.   On: 05/29/2018 14:33    Scheduled Meds: . fentaNYL  25 mcg Transdermal Q72H  . fluconazole  200 mg Oral Daily  . indomethacin  100 mg Rectal Once  . loratadine  10 mg Oral Daily  . multivitamin-iron-minerals-folic acid  1 tablet Oral Daily  . naloxegol oxalate  12.5 mg Oral Daily  . ondansetron  4 mg Oral TID  .  pantoprazole  40 mg Oral BID  . polyethylene glycol  17 g Oral TID  . senna-docusate  1 tablet Oral BID  . sodium chloride flush  5 mL Intracatheter Q8H  . sucralfate  1 g Oral TID WC & HS  . vitamin B-12  1,000 mcg Oral Daily    Continuous Infusions: . sodium chloride 75 mL/hr at 05/30/18 0412     LOS: 3 days     Kayleen Memos, MD [facility] Pager [phone]  If 7PM-7AM, please contact night-coverage [email or web address] Password [facility] 05/30/2018, 12:42 PM

## 2018-05-31 ENCOUNTER — Encounter (HOSPITAL_COMMUNITY): Payer: Self-pay | Admitting: Gastroenterology

## 2018-05-31 ENCOUNTER — Inpatient Hospital Stay (HOSPITAL_COMMUNITY): Payer: 59

## 2018-05-31 HISTORY — PX: IR IMAGING GUIDED PORT INSERTION: IMG5740

## 2018-05-31 LAB — COMPREHENSIVE METABOLIC PANEL
ALBUMIN: 2.9 g/dL — AB (ref 3.5–5.0)
ALT: 246 U/L — ABNORMAL HIGH (ref 17–63)
ANION GAP: 9 (ref 5–15)
AST: 222 U/L — AB (ref 15–41)
Alkaline Phosphatase: 454 U/L — ABNORMAL HIGH (ref 38–126)
BILIRUBIN TOTAL: 9.7 mg/dL — AB (ref 0.3–1.2)
BUN: 7 mg/dL (ref 6–20)
CHLORIDE: 101 mmol/L (ref 101–111)
CO2: 28 mmol/L (ref 22–32)
Calcium: 8.8 mg/dL — ABNORMAL LOW (ref 8.9–10.3)
Creatinine, Ser: 0.54 mg/dL — ABNORMAL LOW (ref 0.61–1.24)
GFR calc Af Amer: >60 mL/min (ref >60)
GFR calc non Af Amer: >60 mL/min (ref >60)
Glucose, Bld: 117 mg/dL — ABNORMAL HIGH (ref 65–99)
POTASSIUM: 3.8 mmol/L (ref 3.5–5.1)
Sodium: 138 mmol/L (ref 135–145)
TOTAL PROTEIN: 6.9 g/dL (ref 6.5–8.1)

## 2018-05-31 LAB — PROTIME-INR
INR: 1.04
PROTHROMBIN TIME: 13.5 s (ref 11.4–15.2)

## 2018-05-31 MED ORDER — FENTANYL CITRATE (PF) 100 MCG/2ML IJ SOLN
INTRAMUSCULAR | Status: AC
  Filled 2018-05-31: qty 4

## 2018-05-31 MED ORDER — FENTANYL CITRATE (PF) 100 MCG/2ML IJ SOLN
INTRAMUSCULAR | Status: AC | PRN
  Administered 2018-05-31 (×2): 50 ug via INTRAVENOUS

## 2018-05-31 MED ORDER — HEPARIN SOD (PORK) LOCK FLUSH 100 UNIT/ML IV SOLN
INTRAVENOUS | Status: AC
  Filled 2018-05-31: qty 5

## 2018-05-31 MED ORDER — MIDAZOLAM HCL 2 MG/2ML IJ SOLN
INTRAMUSCULAR | Status: AC
  Filled 2018-05-31: qty 4

## 2018-05-31 MED ORDER — FLUMAZENIL 0.5 MG/5ML IV SOLN
INTRAVENOUS | Status: AC
  Filled 2018-05-31: qty 5

## 2018-05-31 MED ORDER — MIDAZOLAM HCL 2 MG/2ML IJ SOLN
INTRAMUSCULAR | Status: AC | PRN
  Administered 2018-05-31 (×2): 1 mg via INTRAVENOUS

## 2018-05-31 MED ORDER — LIDOCAINE-EPINEPHRINE (PF) 2 %-1:200000 IJ SOLN
INTRAMUSCULAR | Status: AC | PRN
  Administered 2018-05-31: 20 mL

## 2018-05-31 MED ORDER — CLINDAMYCIN PHOSPHATE 900 MG/50ML IV SOLN
INTRAVENOUS | Status: AC
  Filled 2018-05-31: qty 50

## 2018-05-31 MED ORDER — LIDOCAINE-EPINEPHRINE (PF) 2 %-1:200000 IJ SOLN
INTRAMUSCULAR | Status: AC
  Filled 2018-05-31: qty 20

## 2018-05-31 MED ORDER — NALOXONE HCL 0.4 MG/ML IJ SOLN
INTRAMUSCULAR | Status: AC
  Filled 2018-05-31: qty 1

## 2018-05-31 MED ORDER — CLINDAMYCIN PHOSPHATE 900 MG/50ML IV SOLN: 900.0000 mg | Freq: Once | INTRAVENOUS | Status: DC

## 2018-05-31 MED ORDER — CLINDAMYCIN PHOSPHATE 900 MG/50ML IV SOLN
900.0000 mg | INTRAVENOUS | Status: AC
  Administered 2018-05-31: 900 mg via INTRAVENOUS

## 2018-05-31 MED ORDER — ENOXAPARIN SODIUM 40 MG/0.4ML ~~LOC~~ SOLN
40.0000 mg | SUBCUTANEOUS | Status: DC
  Administered 2018-06-01: 40 mg via SUBCUTANEOUS
  Filled 2018-05-31: qty 0.4

## 2018-05-31 NOTE — Sedation Documentation (Signed)
Patient is resting comfortably with eyes closed in NAD. 

## 2018-05-31 NOTE — Progress Notes (Signed)
MEDICATION-RELATED CONSULT NOTE   IR Procedure Consult - Anticoagulant/Antiplatelet PTA/Inpatient Med List Review by Pharmacist    Procedure: PAC    Completed: 05/31/18 at 1638  Post-Procedural bleeding risk per IR MD assessment:  standard  Antithrombotic medications on inpatient or PTA profile prior to procedure:   Lovenox 40mg  daily    Recommended restart time per IR Post-Procedure Guidelines:  Tomorrow 6/18 AM   Other considerations:      Plan:    Restart Lovenox 40mg  daily tomorrow AM 4/66 per policy  Adrian Saran, PharmD, BCPS Pager [phone] 05/31/2018 11:31 AM

## 2018-05-31 NOTE — Progress Notes (Signed)
PROGRESS NOTE    [name]  PFX:[id number] DOB: Sep 14, 1961 DOA: 05/27/2018 PCP: Ma Hillock, DO    Brief Narrative:  Patient is a 57 year old male with history of squamous cell carcinoma, GERD, and chronic constipation who was admitted for transaminitis and abnormal abdominal CT scan. His CT revealed a 3.3cm mass around the pancreatic tail and proximal ductal dilation. The etiology is unknown but thought to be pancreatic or gastric. An EGD was done showing a stomach mass, biopsy is pending. A percutaneous biliary drain was placed by IR in hope to decrease LFTs but they have remained high with Alk phos: 454, AST:222, ALT:246, and Tbili:9.7 this morning. Oncology is following and recommends a port-a-cath to be placed.  Assessment & Plan:   Active Problems:   Abnormal CT scan, gastrointestinal tract   Elevated liver enzymes   Carcinoma (HCC)   Elevated LFTs  Abnormal CT scan, GI tract -CT showed: Widespread intraperitoneal metastasis. 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  -Port-a-cath to be placed today -Biopsy pending post EGD -Oncology and GI following  Elevated LFTs -LFTs remaining elevated -Post biliary drain placement, continue to monitor  drainage, 159m this morning -Repeat CMP in morning  Constipation -Likely secondary to opioids and abdominal mass -Continue Miralax and Senokot -Patient experienced relief with enema yesterday, consider again if symptoms increase  Nausea -Patient's nausea has improved post enema -Continue Zofran TID, consider Reglan if patient becomes more symptomatic   DVT prophylaxis:Lovenox  Code Status: Full Code Family Communication: None Disposition Plan: Home when improved   Consultants:    GI  IR  Oncology  Procedures:   Port-a-cath successfully placed 05/31/18  Antimicrobials:   None   Subjective: Patient reports decreased nausea since yesterday and finally has his appetite back after having bowel movement yesterday post enema. Patient's chief concern is wanting to eat.  Objective: Vitals:   05/31/18 1055 05/31/18 1100 05/31/18 1105 05/31/18 1110  BP: 127/89 126/81 128/87 127/88  Pulse: 81 84 87 81  Resp: 20 19 (!) 24 20  Temp:      TempSrc:      SpO2: 99% 99% 99% 99%  Weight:      Height:        Intake/Output Summary (Last 24 hours) at 05/31/2018 1156 Last data filed at 05/31/2018 0900 Gross per 24 hour  Intake 1672.5 ml  Output 1805 ml  Net -132.5 ml   Filed Weights   05/27/18 1410 05/28/18 0758  Weight: 88.5 kg (195 lb) 88.5 kg (195 lb)    Examination:   General: Patient appears fatigued and in mild discomfort Neurology: Awake and alert, non focal  E ENT: no pallor, no icterus, oral mucosa moist Cardiovascular: No JVD. S1-S2 present, rhythmic, no gallops, rubs, or murmurs. No lower extremity edema. Pulmonary: vesicular breath sounds bilaterally, adequate air movement, no wheezing, rhonchi or rales. Gastrointestinal. Abdomen diffusely tender, previously healed midline scar, no rebound or guarding Skin. No rashes Musculoskeletal: no joint deformities     Data Reviewed: I have personally reviewed following labs and imaging studies  CBC: Recent Labs  Lab 05/26/18 1548 05/27/18 1435 05/28/18 0352 05/29/18 0442  WBC 8.5 9.1 7.8 10.6*  NEUTROABS 5.7 6.4  --   --   HGB 14.2 13.1 12.4* 12.6*  HCT 41.7  38.9* 37.8* 38.2*  MCV 90.0 91.7 92.0 92.3  PLT 383.0 406* 409* 146*   Basic Metabolic Panel: Recent Labs  Lab 05/27/18 1435 05/28/18 0352 05/29/18 0442 05/30/18 0440 05/31/18 0419  NA 137 137 137 138 138  K 3.9 3.9 3.7 3.8 3.8  CL 94* 97* 99* 100* 101  CO2 32 33* '29 30 28  '$ GLUCOSE 117* 112* 171* 128* 117*  BUN '9 7 7 6 7   '$ CREATININE 0.74 0.59* 0.62 0.53* 0.54*  CALCIUM 8.8* 8.5* 8.5* 8.4* 8.8*   GFR: Estimated Creatinine Clearance: 112.4 mL/min (A) (by C-G formula based on SCr of 0.54 mg/dL (L)). Liver Function Tests: Recent Labs  Lab 05/27/18 1435 05/28/18 0352 05/29/18 0442 05/30/18 0440 05/31/18 0419  AST 222* 196* 254* 246* 222*  ALT 263* 234* 251* 257* 246*  ALKPHOS 354* 317* 365* 388* 454*  BILITOT 9.8* 9.4* 8.5* 8.8* 9.7*  PROT 7.5 6.9 6.8 6.6 6.9  ALBUMIN 3.5 3.3* 3.2* 3.0* 2.9*   Recent Labs  Lab 05/26/18 1548 05/27/18 1435  LIPASE 30.0 32   No results for input(s): AMMONIA in the last 168 hours. Coagulation Profile: Recent Labs  Lab 05/26/18 1548 05/31/18 0419  INR 1.2* 1.04   Cardiac Enzymes: No results for input(s): CKTOTAL, CKMB, CKMBINDEX, TROPONINI in the last 168 hours. BNP (last 3 results) No results for input(s): PROBNP in the last 8760 hours. HbA1C: No results for input(s): HGBA1C in the last 72 hours. CBG: No results for input(s): GLUCAP in the last 168 hours. Lipid Profile: No results for input(s): CHOL, HDL, LDLCALC, TRIG, CHOLHDL, LDLDIRECT in the last 72 hours. Thyroid Function Tests: No results for input(s): TSH, T4TOTAL, FREET4, T3FREE, THYROIDAB in the last 72 hours. Anemia Panel: No results for input(s): VITAMINB12, FOLATE, FERRITIN, TIBC, IRON, RETICCTPCT in the last 72 hours.    Radiology Studies: I have reviewed all of the imaging during this hospital visit personally     Scheduled Meds: . [START ON 06/01/2018] enoxaparin (LOVENOX) injection  40 mg Subcutaneous Q24H  . fentaNYL  25 mcg Transdermal Q72H  . fluconazole  200 mg Oral Daily  . indomethacin  100 mg Rectal Once  . loratadine  10 mg Oral Daily  . multivitamin-iron-minerals-folic acid  1 tablet Oral Daily  . naloxegol oxalate  12.5 mg Oral Daily  . ondansetron  4 mg Oral TID  . pantoprazole  40 mg Oral BID  . polyethylene glycol  17 g Oral TID  . senna-docusate  1 tablet Oral  BID  . sodium chloride flush  5 mL Intracatheter Q8H  . sucralfate  1 g Oral TID WC & HS  . vitamin B-12  1,000 mcg Oral Daily   Continuous Infusions: . sodium chloride 75 mL/hr at 05/31/18 0300     LOS: 4 days        Carita Pian, MD [facility] Pager [phone]

## 2018-05-31 NOTE — Progress Notes (Signed)
Referring Physician(s): Ennever,P  Supervising Physician: Sandi Mariscal  Patient Status:  [facility] - In-pt  Chief Complaint:  Biliary obstruction , gastric adenocarcinoma  Subjective: Patient just recently returned from Port-A-Cath placement.  Still slightly sedated.  Currently denies nausea or vomiting; mildly tender at bili drain site.  Allergies: Cephalexin; Other; Latex; and Tape  Medications: Prior to Admission medications   Medication Sig Start Date End Date Taking? Authorizing Provider  cetirizine (ZYRTEC) 10 MG tablet Take 10 mg by mouth daily.     Yes [provider]  Cyanocobalamin (VITAMIN B 12 PO) Take 1,000 mcg by mouth daily.   Yes [provider]  esomeprazole (NEXIUM) 20 MG capsule TAKE ONE CAPSULE BY MOUTH EVERY DAY 04/10/15  Yes Dorena Cookey, MD  flintstones complete (FLINTSTONES) 60 MG chewable tablet Chew 1 tablet by mouth daily.   Yes [provider]  halobetasol (ULTRAVATE) 0.05 % ointment APPLY TO AFFECTED AREA TWICE A DAY 04/20/18  Yes Kuneff, Renee A, DO  hydrocortisone cream 1 % Apply 1 application topically daily as needed for itching.   Yes [provider]  naproxen sodium (ALEVE) 220 MG tablet Take 440 mg by mouth daily as needed (pain).   Yes [provider]  ondansetron (ZOFRAN ODT) 4 MG disintegrating tablet Take 1 tablet (4 mg total) by mouth every 8 (eight) hours as needed for nausea or vomiting. 05/26/18  Yes Esterwood, Amy S, PA-C  oxyCODONE (OXY IR/ROXICODONE) 5 MG immediate release tablet Take 1 tablet (5 mg total) by mouth every 6 (six) hours as needed for severe pain. 05/24/18  Yes Kuneff, Renee A, DO  polyethylene glycol (MIRALAX / GLYCOLAX) packet Take 17 g by mouth daily.   Yes [provider]  sildenafil (REVATIO) 20 MG tablet 1-5 tabs PO prior to sexually activity. 04/20/18  Yes Kuneff, Renee A, DO     Vital Signs: BP 131/89 (BP Location: Right Arm)   Pulse 72   Temp 98.7 F (37.1 C)  (Oral)   Resp 20   Ht _0  (1.803 m)   Wt 195 lb (88.5 kg)   SpO2 98%   BMI 27.20 kg/m   Physical Exam awake, slightly drowsy.  Biliary drain intact, insertion site clean and dry, output 260 cc.  Drain irrigated with 10 cc sterile normal saline without difficulty.  Imaging: Ct Chest W Contrast  Result Date: 05/28/2018 CLINICAL DATA:  57 year old male with several weeks of abdominal discomfort. Mass involving the pancreas and stomach. Followup study. EXAM: CT CHEST, ABDOMEN, AND PELVIS WITH CONTRAST TECHNIQUE: Multidetector CT imaging of the chest, abdomen and pelvis was performed following the standard protocol during bolus administration of intravenous contrast. CONTRAST:  178m ISOVUE-300 IOPAMIDOL (ISOVUE-300) INJECTION 61%, 360mISOVUE-300 IOPAMIDOL (ISOVUE-300) INJECTION 61% COMPARISON:  CT the abdomen and pelvis 05/19/2018. FINDINGS: CT CHEST FINDINGS Cardiovascular: Heart size is normal. There is no significant pericardial fluid, thickening or pericardial calcification. Mild atherosclerosis in the great vessels. No coronary artery calcifications. Mediastinum/Nodes: Some of the loculated fluid components or cystic neoplasm from the upper abdomen (discussed below) extends cephalad via the esophageal hiatus into the lower middle mediastinum to the right of the distal esophagus. No pathologically enlarged mediastinal or hilar lymph nodes are noted. Esophagus is unremarkable in appearance. No axillary lymphadenopathy. Lungs/Pleura: Small left-sided Bochdalek's hernia incidentally noted. No suspicious appearing pulmonary nodules or masses are noted. No acute consolidative airspace disease. A few areas of mild linear scarring are noted. No pleural effusions. Musculoskeletal: Multiple old  healed bilateral rib fractures with significant posttraumatic deformity of the thoracic cage. There are no aggressive appearing lytic or blastic lesions noted in the visualized portions of the skeleton. CT ABDOMEN  PELVIS FINDINGS Hepatobiliary: In segment 2 of the liver there is a well-defined 1.2 cm low to intermediate attenuation lesion which is incompletely characterized. No other intrahepatic lesion is confidently identified on today's examination. There is extensive scalloping of the surface of the liver which appears to be related to multiple serosal implants, the largest of which is overlying the right lobe of the liver where a low-attenuation implant or loculated fluid collection measures approximately 4.6 x 6.8 cm (axial image 51 of series 2). Mild intrahepatic biliary ductal dilatation. Common bile duct is poorly demonstrated. Status post cholecystectomy. Pancreas: In the tail of the pancreas there is a poorly defined mass which extends laterally and cephalad coming in contact with the adjacent left hemidiaphragm (which it appears to invade), as well as the lateral left upper abdominal wall and the lateral surface of the cardia and proximal body of the stomach. This mass measures approximately 5.5 x 6.4 x 5.2 cm (axial image 44 of series 2 and coronal image 98 of series 8). The remainder of the body and head of the pancreas are otherwise generally unremarkable in appearance. Spleen: Status post splenectomy. Adrenals/Urinary Tract: Bilateral kidneys and bilateral adrenal glands are normal in appearance. No hydroureteronephrosis. Urinary bladder is normal in appearance. Stomach/Bowel: There is a poorly defined infiltrative mass-like area in the proximal aspect of the stomach along the greater curvature which is estimated to measure approximately 11.5 x 4.1 x 6.0 cm (axial image 43 of series 2 and coronal image 77 of series 8). Several other more cystic appearing areas are also noted associated with the gastric wall both along the greater curvature, the inferior surface of the stomach, and along the lesser curvature in the antral pre-pyloric region. No pathologic dilatation of small bowel or colon. Appendix is not  confidently identified. Vascular/Lymphatic: Aortic atherosclerosis, without evidence of aneurysm or dissection in the abdominal or pelvic vasculature. Several prominent borderline enlarged and mildly enlarged retroperitoneal lymph nodes are noted measuring up to 1 cm in short axis in the left para-aortic nodal station (axial image 66 of series 2). In the upper abdomen in the expected location of portacaval, hepatic duodenal and celiac axis nodal stations there are large low-attenuation areas which have a cystic appearance, but could reflect necrotic lymphadenopathy. Some of this extends into the lower middle mediastinum (discussed above). While lymphadenopathy is not excluded, at this time these are favored to reflect malignant mucinous/cystic metastatic deposits Reproductive: Prostate gland and seminal vesicles are unremarkable in appearance. Other: Trace volume of ascites. Multifocal peritoneal nodularity noted, concerning for widespread intraperitoneal metastatic disease. Musculoskeletal: There are no aggressive appearing lytic or blastic lesions noted in the visualized portions of the skeleton. IMPRESSION: 1. Widespread intraperitoneal metastasis, as detailed above. The origin of this neoplasm is uncertain, and could either be from a large infiltrative mass in the tail of the pancreas, which has subsequently invaded the proximal stomach, or a large gastric neoplasm which extended into a splenectomy bed now involving the tail of the pancreas. Tissue sampling is recommended to establish a definitive diagnosis. 2. No definite evidence of intrapulmonary metastasis. 3. There does appear to be direct spread of disease through the esophageal hiatus into the lower middle mediastinum. 4. Additional incidental findings, as above. Electronically Signed   By: Vinnie Langton M.D.   On: 05/28/2018  07:25   Ct Abdomen Pelvis W Contrast  Result Date: 05/28/2018 CLINICAL DATA:  57 year old male with several weeks of  abdominal discomfort. Mass involving the pancreas and stomach. Followup study. EXAM: CT CHEST, ABDOMEN, AND PELVIS WITH CONTRAST TECHNIQUE: Multidetector CT imaging of the chest, abdomen and pelvis was performed following the standard protocol during bolus administration of intravenous contrast. CONTRAST:  169m ISOVUE-300 IOPAMIDOL (ISOVUE-300) INJECTION 61%, 311mISOVUE-300 IOPAMIDOL (ISOVUE-300) INJECTION 61% COMPARISON:  CT the abdomen and pelvis 05/19/2018. FINDINGS: CT CHEST FINDINGS Cardiovascular: Heart size is normal. There is no significant pericardial fluid, thickening or pericardial calcification. Mild atherosclerosis in the great vessels. No coronary artery calcifications. Mediastinum/Nodes: Some of the loculated fluid components or cystic neoplasm from the upper abdomen (discussed below) extends cephalad via the esophageal hiatus into the lower middle mediastinum to the right of the distal esophagus. No pathologically enlarged mediastinal or hilar lymph nodes are noted. Esophagus is unremarkable in appearance. No axillary lymphadenopathy. Lungs/Pleura: Small left-sided Bochdalek's hernia incidentally noted. No suspicious appearing pulmonary nodules or masses are noted. No acute consolidative airspace disease. A few areas of mild linear scarring are noted. No pleural effusions. Musculoskeletal: Multiple old healed bilateral rib fractures with significant posttraumatic deformity of the thoracic cage. There are no aggressive appearing lytic or blastic lesions noted in the visualized portions of the skeleton. CT ABDOMEN PELVIS FINDINGS Hepatobiliary: In segment 2 of the liver there is a well-defined 1.2 cm low to intermediate attenuation lesion which is incompletely characterized. No other intrahepatic lesion is confidently identified on today's examination. There is extensive scalloping of the surface of the liver which appears to be related to multiple serosal implants, the largest of which is overlying  the right lobe of the liver where a low-attenuation implant or loculated fluid collection measures approximately 4.6 x 6.8 cm (axial image 51 of series 2). Mild intrahepatic biliary ductal dilatation. Common bile duct is poorly demonstrated. Status post cholecystectomy. Pancreas: In the tail of the pancreas there is a poorly defined mass which extends laterally and cephalad coming in contact with the adjacent left hemidiaphragm (which it appears to invade), as well as the lateral left upper abdominal wall and the lateral surface of the cardia and proximal body of the stomach. This mass measures approximately 5.5 x 6.4 x 5.2 cm (axial image 44 of series 2 and coronal image 98 of series 8). The remainder of the body and head of the pancreas are otherwise generally unremarkable in appearance. Spleen: Status post splenectomy. Adrenals/Urinary Tract: Bilateral kidneys and bilateral adrenal glands are normal in appearance. No hydroureteronephrosis. Urinary bladder is normal in appearance. Stomach/Bowel: There is a poorly defined infiltrative mass-like area in the proximal aspect of the stomach along the greater curvature which is estimated to measure approximately 11.5 x 4.1 x 6.0 cm (axial image 43 of series 2 and coronal image 77 of series 8). Several other more cystic appearing areas are also noted associated with the gastric wall both along the greater curvature, the inferior surface of the stomach, and along the lesser curvature in the antral pre-pyloric region. No pathologic dilatation of small bowel or colon. Appendix is not confidently identified. Vascular/Lymphatic: Aortic atherosclerosis, without evidence of aneurysm or dissection in the abdominal or pelvic vasculature. Several prominent borderline enlarged and mildly enlarged retroperitoneal lymph nodes are noted measuring up to 1 cm in short axis in the left para-aortic nodal station (axial image 66 of series 2). In the upper abdomen in the expected location  of portacaval, hepatic  duodenal and celiac axis nodal stations there are large low-attenuation areas which have a cystic appearance, but could reflect necrotic lymphadenopathy. Some of this extends into the lower middle mediastinum (discussed above). While lymphadenopathy is not excluded, at this time these are favored to reflect malignant mucinous/cystic metastatic deposits Reproductive: Prostate gland and seminal vesicles are unremarkable in appearance. Other: Trace volume of ascites. Multifocal peritoneal nodularity noted, concerning for widespread intraperitoneal metastatic disease. Musculoskeletal: There are no aggressive appearing lytic or blastic lesions noted in the visualized portions of the skeleton. IMPRESSION: 1. Widespread intraperitoneal metastasis, as detailed above. The origin of this neoplasm is uncertain, and could either be from a large infiltrative mass in the tail of the pancreas, which has subsequently invaded the proximal stomach, or a large gastric neoplasm which extended into a splenectomy bed now involving the tail of the pancreas. Tissue sampling is recommended to establish a definitive diagnosis. 2. No definite evidence of intrapulmonary metastasis. 3. There does appear to be direct spread of disease through the esophageal hiatus into the lower middle mediastinum. 4. Additional incidental findings, as above. Electronically Signed   By: Vinnie Langton M.D.   On: 05/28/2018 07:25   US Abdomen Limited  Result Date: 05/29/2018 CLINICAL DATA:  57 year old male with a history of questionable ascites. EXAM: LIMITED ABDOMEN ULTRASOUND FOR ASCITES TECHNIQUE: Limited ultrasound survey for ascites was performed in all four abdominal quadrants. COMPARISON:  None. FINDINGS: Limited ultrasound survey of the abdomen demonstrates no significant ascites. IMPRESSION: Sonographic survey demonstrates no significant ascites. Electronically Signed   By: Corrie Mckusick D.O.   On: 05/29/2018 14:33   Ir  Percutaneous Transhepatic Cholangiogram  Result Date: 05/28/2018 INDICATION: Malignant obstructive jaundice, unsuccessful ERCP stent placement EXAM: FLUOROSCOPIC RIGHT INTERNAL EXTERNAL 10 FRENCH BILIARY DRAIN MEDICATIONS: 3.375 G ZOSYN; The antibiotic was administered within an appropriate time frame prior to the initiation of the procedure. ANESTHESIA/SEDATION: Moderate (conscious) sedation was employed during this procedure. A total of Versed 3.0 mg and Fentanyl 150 mcg was administered intravenously. Moderate Sedation Time: 18 minutes. The patient's level of consciousness and vital signs were monitored continuously by radiology nursing throughout the procedure under my direct supervision. FLUOROSCOPY TIME:  Fluoroscopy Time: 2 minutes 30 seconds (140 mGy). COMPLICATIONS: None immediate. PROCEDURE: Informed written consent was obtained from the patient after a thorough discussion of the procedural risks, benefits and alternatives. All questions were addressed. Maximal Sterile Barrier Technique was utilized including caps, mask, sterile gowns, sterile gloves, sterile drape, hand hygiene and skin antiseptic. A timeout was performed prior to the initiation of the procedure. Under sterile conditions and local anesthesia, percutaneous ultrasound access performed of a peripheral dilated right hepatic duct. Images obtained for documentation. There was return of bile. Contrast injection performed for initial cholangiogram demonstrating mild biliary dilatation. Guidewire advanced centrally within the biliary tree followed by the Accustick dilator set. Kumpe catheter and guidewire were advanced into the duodenum. Contrast injection confirms position in the duodenum. Amplatz guidewire inserted. Tract dilatation performed to insert a 10 [place] drain. Retention loop formed in the duodenum. Contrast injection confirms drainage of the bile ducts. Catheter secured with a Prolene suture. External gravity drainage bag connected.  No immediate complication. Patient tolerated the procedure well. IMPRESSION: Successful ultrasound 10 French right internal external biliary drain. Electronically Signed   By: Jerilynn Mages.  Shick M.D.   On: 05/28/2018 17:07   Ir Imaging Guided Port Insertion  Result Date: 05/31/2018 INDICATION: History of metastatic pancreatic cancer, in need of durable intravenous access for  chemotherapy administration. EXAM: IMPLANTED PORT A CATH PLACEMENT WITH ULTRASOUND AND FLUOROSCOPIC GUIDANCE COMPARISON:  CT the chest, abdomen pelvis - 05/27/2018 MEDICATIONS: Cleocin 900 mg IV; The antibiotic was administered within an appropriate time interval prior to skin puncture. ANESTHESIA/SEDATION: Moderate (conscious) sedation was employed during this procedure. A total of Versed 2 mg and Fentanyl 100 mcg was administered intravenously. Moderate Sedation Time: 27 minutes. The patient's level of consciousness and vital signs were monitored continuously by radiology nursing throughout the procedure under my direct supervision. CONTRAST:  None FLUOROSCOPY TIME:  12 seconds (9 mGy) COMPLICATIONS: None immediate. PROCEDURE: The procedure, risks, benefits, and alternatives were explained to the patient. Questions regarding the procedure were encouraged and answered. The patient understands and consents to the procedure. The right neck and chest were prepped with chlorhexidine in a sterile fashion, and a sterile drape was applied covering the operative field. Maximum barrier sterile technique with sterile gowns and gloves were used for the procedure. A timeout was performed prior to the initiation of the procedure. Local anesthesia was provided with 1% lidocaine with epinephrine. After creating a small venotomy incision, a micropuncture kit was utilized to access the internal jugular vein. Real-time ultrasound guidance was utilized for vascular access including the acquisition of a permanent ultrasound image documenting patency of the accessed  vessel. The microwire was utilized to measure appropriate catheter length. A subcutaneous port pocket was then created along the upper chest wall utilizing a combination of sharp and blunt dissection. The pocket was irrigated with sterile saline. A single lumen thin power injectable port was chosen for placement. The 8 Fr catheter was tunneled from the port pocket site to the venotomy incision. The port was placed in the pocket. The external catheter was trimmed to appropriate length. At the venotomy, an 8 Fr peel-away sheath was placed over a guidewire under fluoroscopic guidance. The catheter was then placed through the sheath and the sheath was removed. Final catheter positioning was confirmed and documented with a fluoroscopic spot radiograph. The port was accessed with a Huber needle, aspirated and flushed with heparinized saline. The venotomy site was closed with an interrupted 4-0 Vicryl suture. The port pocket incision was closed with interrupted 2-0 Vicryl suture and the skin was opposed with a running subcuticular 4-0 Vicryl suture. Dermabond and Steri-strips were applied to both incisions. Dressings were placed. The patient tolerated the procedure well without immediate post procedural complication. FINDINGS: After catheter placement, the tip lies within the superior cavoatrial junction. The catheter aspirates and flushes normally and is ready for immediate use. IMPRESSION: Successful placement of a right internal jugular approach power injectable Port-A-Cath. The catheter is ready for immediate use. Electronically Signed   By: Sandi Mariscal M.D.   On: 05/31/2018 11:41    Labs:  CBC: Recent Labs    05/26/18 1548 05/27/18 1435 05/28/18 0352 05/29/18 0442  WBC 8.5 9.1 7.8 10.6*  HGB 14.2 13.1 12.4* 12.6*  HCT 41.7 38.9* 37.8* 38.2*  PLT 383.0 406* 409* 429*    COAGS: Recent Labs    05/26/18 1548 05/31/18 0419  INR 1.2* 1.04    BMP: Recent Labs    05/28/18 0352 05/29/18 0442  05/30/18 0440 05/31/18 0419  NA 137 137 138 138  K 3.9 3.7 3.8 3.8  CL 97* 99* 100* 101  CO2 33* _0 GLUCOSE 112* 171* 128* 117*  BUN _1 CALCIUM 8.5* 8.5* 8.4* 8.8*  CREATININE 0.59* 0.62 0.53* 0.54*  GFRNONAA >60 >  60 >60 >60  GFRAA >60 >60 >60 >60    LIVER FUNCTION TESTS: Recent Labs    05/28/18 0352 05/29/18 0442 05/30/18 0440 05/31/18 0419  BILITOT 9.4* 8.5* 8.8* 9.7*  AST 196* 254* 246* 222*  ALT 234* 251* 257* 246*  ALKPHOS 317* 365* 388* 454*  PROT 6.9 6.8 6.6 6.9  ALBUMIN 3.3* 3.2* 3.0* 2.9*    Assessment and Plan: Patient with history of biliary obstruction, recently diagnosed adenocarcinoma of the stomach; status post PTC with internal/external biliary drain placement on 05/28/18: Afebrile, total bilirubin 9.7 up slightly from 8.8. Drain irrigates without difficulty.  Continue every 8 hour flushes of biliary drain with sterile normal saline.  Monitor LFTs.  Electronically Signed: D. Rowe Robert, PA-C 05/31/2018, 2:21 PM   I spent a total of 15 minutes at the the patient's bedside AND on the patient's hospital floor or unit, greater than 50% of which was counseling/coordinating care for biliary drain    Patient ID: [name], male   DOB: [birth year], 57 y.o.   MRN: [id number]

## 2018-05-31 NOTE — Procedures (Signed)
Pre Procedure Dx: Pancreatic Cancer Post Procedural Dx: Same  Successful placement of right IJ approach port-a-cath with tip at the superior caval atrial junction. The catheter is ready for immediate use.  Estimated Blood Loss: Minimal  Complications: None immediate.  Jay Tenea Sens, MD Pager #: [phone]   

## 2018-06-01 ENCOUNTER — Encounter (HOSPITAL_COMMUNITY): Payer: Self-pay | Admitting: Hematology & Oncology

## 2018-06-01 ENCOUNTER — Inpatient Hospital Stay (HOSPITAL_COMMUNITY): Payer: 59

## 2018-06-01 DIAGNOSIS — C169 Malignant neoplasm of stomach, unspecified: Secondary | ICD-10-CM

## 2018-06-01 DIAGNOSIS — C799 Secondary malignant neoplasm of unspecified site: Secondary | ICD-10-CM | POA: Diagnosis present

## 2018-06-01 HISTORY — DX: Secondary malignant neoplasm of unspecified site: C79.9

## 2018-06-01 HISTORY — DX: Malignant neoplasm of stomach, unspecified: C16.9

## 2018-06-01 HISTORY — PX: IR CHOLANGIOGRAM EXISTING TUBE: IMG6040

## 2018-06-01 LAB — COMPREHENSIVE METABOLIC PANEL
ALBUMIN: 2.6 g/dL — AB (ref 3.5–5.0)
ALT: 234 U/L — ABNORMAL HIGH (ref 17–63)
ANION GAP: 9 (ref 5–15)
AST: 243 U/L — ABNORMAL HIGH (ref 15–41)
Alkaline Phosphatase: 496 U/L — ABNORMAL HIGH (ref 38–126)
BUN: 6 mg/dL (ref 6–20)
CHLORIDE: 99 mmol/L — AB (ref 101–111)
CO2: 29 mmol/L (ref 22–32)
Calcium: 8.5 mg/dL — ABNORMAL LOW (ref 8.9–10.3)
Creatinine, Ser: 0.58 mg/dL — ABNORMAL LOW (ref 0.61–1.24)
GFR calc non Af Amer: >60 mL/min (ref >60)
Glucose, Bld: 123 mg/dL — ABNORMAL HIGH (ref 65–99)
POTASSIUM: 3.7 mmol/L (ref 3.5–5.1)
SODIUM: 137 mmol/L (ref 135–145)
Total Bilirubin: 9.5 mg/dL — ABNORMAL HIGH (ref 0.3–1.2)
Total Protein: 6.4 g/dL — ABNORMAL LOW (ref 6.5–8.1)

## 2018-06-01 MED ORDER — IOPAMIDOL (ISOVUE-300) INJECTION 61%
INTRAVENOUS | Status: AC
  Filled 2018-06-01: qty 50

## 2018-06-01 MED ORDER — DIPHENHYDRAMINE HCL 25 MG PO CAPS
25.0000 mg | ORAL_CAPSULE | Freq: Every evening | ORAL | Status: DC | PRN
Start: 2018-06-01 — End: 2018-06-04

## 2018-06-01 MED ORDER — IOPAMIDOL (ISOVUE-300) INJECTION 61%
INTRAVENOUS | Status: AC
  Filled 2018-06-01: qty 100

## 2018-06-01 MED ORDER — IOPAMIDOL (ISOVUE-300) INJECTION 61%: 50.0000 mL | Freq: Once | INTRAVENOUS | Status: DC | PRN

## 2018-06-01 MED ORDER — IOPAMIDOL (ISOVUE-300) INJECTION 61%
100.0000 mL | Freq: Once | INTRAVENOUS | Status: AC | PRN
  Administered 2018-06-01: 100 mL via INTRAVENOUS

## 2018-06-01 MED ORDER — ENOXAPARIN SODIUM 40 MG/0.4ML ~~LOC~~ SOLN: 40.0000 mg | SUBCUTANEOUS | Status: DC

## 2018-06-01 NOTE — Progress Notes (Signed)
The problem that we have right now is at his liver function tests are going up.  He has the external biliary drainage tube.  However, it is not draining.  His bilirubin is going up quickly.  Today, the bilirubin is 9.5.  His SGPT is 234 and SGOT to 43.  Because of this, we are severely limited as to how we can treat him.  Thankfully he has his Port-A-Cath in.  I spoke to the pathologist.  She says that this is stomach cancer.  I am sending off for molecular analysis.  This will include HER-2, PD-L1, and other genetic markers.  I think our best chance however is to start him on inpatient chemotherapy.  We really need to get some tumor shrinkage here.  The only way to do this is with chemotherapy.  He wants to get out of here.  He needs to be home by Friday for his son's birthday.  We will try to start treatment tomorrow.  Again, because of his elevated bilirubin, we will have to make significant dosage reductions with 5-FU.   He is able to eat.  He does have some hunger.  He has had no vomiting.  He did have a ultrasound of the abdomen on Saturday.  There is no fluid that need to be drawn off.  His CBC has not been done since Saturday.  We will had to make sure that this is okay for treatment.  Again, we will start treatment on him tomorrow.  He is not a candidate for any clinical trial because his liver function studies are just way too high for him to qualify for any clinical trial.  Lattie Haw, MD  Darlyn Chamber 33:6

## 2018-06-01 NOTE — Procedures (Signed)
Pre procedural Diagnosis: Biliary Obstruction Post procedural Diagnosis: Same  Appropriately positioned and functioning right hepatic approach percutaneous biliary drainage catheter.   PLAN:   My assumption is the lack of opacification of the intrahepatic biliary tree is secondary to the lack of any significant intrahepatic biliary ductal dilatation as was suggested both on the preceding abdominal CT performed 05/19/2018 as well as the ultrasound and fluoroscopic guided biliary drainage catheter placement performed 05/26/2018.   To confirm this assumption, I will obtain a IV only CT scan of the abdomen.  If there is no significant intrahepatic biliary duct dilatation, the etiology of the patient's elevated LFTs is likely secondary to a non obstructive etiology.   If intrahepatic biliary duct dilatation is seen on the abdominal CT, would pursue fluoroscopic guided biliary drainage catheter exchange and potential up sizing.   Ronny Bacon, MD Pager #: [phone]

## 2018-06-01 NOTE — Progress Notes (Signed)
Patient ID: [name], male   DOB: 12-11-1961, 57 y.o.   MRN: [id number] Pt s/p f/u cholangiogram today with no obvious obstruction noted; will plan to recheck LFT's in am and if not significantly changed tent plan for drain exchange/upsizing; hold lovenox until after above procedure.

## 2018-06-01 NOTE — Progress Notes (Signed)
PROGRESS NOTE    [name]  NTI:[id number] DOB: [birth year] DOA: 05/27/2018 PCP: Ma Hillock, DO    Brief Narrative:  Patient is a 57 year old male with history of squamous cell carcinoma, GERD, and chronic constipation who was admitted for abnormal abdominal CT scan he had as a result of abdominal discomfort. His CT revealed a mass around the pancreas and stomach. An EGD was done with biopsy, pathology reports the mass is gastric in origin . He also had a transaminitis on admission so a percutaneous biliary drain was placed by IR in hope to decrease LFTs. Patient reports decrease in nausea, however LFTs remain elevated. He is being followed by oncology who recommended a Port-a-cath which was successfully placed yesterday.   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 110mL yesterday, 15mL this morning. IR going to perform cholangiogram to evaluate tube placement -Will consider MRI if drain needs no adjustment for further evaluation of elevated LFTs -Repeat CMP in morning  Constipation -Resolving, likely secondary to opioids and abdominal mass -Continue Miralax and Senokot -Patient experienced relief with enema two days ago. Had another bowel movement this morning.   Nausea -Patient's nausea has improved post enema -Continue Zofran TID, consider Reglan if patient becomes more  symptomatic  Insomnia -Patient reports difficult sleeping, will try Benadryl QHS   DVT prophylaxis:Lovenox  Code Status: Full Code Family Communication: None Disposition Plan: Home when improved   Consultants:   GI  IR  Oncology  Procedures:   Port-a-cath successfully placed 05/31/18  Antimicrobials:   None   Subjective: Patient reports feeling better. He has been able to eat and had another bowel movement this morning. His chief concern is wanting to get his liver enzymes down so he can leave the hospital. He also reports difficulty sleeping.   Objective: Vitals:   05/31/18 1319 05/31/18 1508 05/31/18 2142 06/01/18 0500  BP: 131/89 138/82 123/80 129/88  Pulse: 72 91 75 66  Resp: 20 18 18 16   Temp: 98.7 F (37.1 C) 98.4 F (36.9 C) 98.8 F (37.1 C) 97.9 F (36.6 C)  TempSrc: Oral Oral Oral Oral  SpO2: 98% 96% 97% 98%  Weight:      Height:        Intake/Output Summary (Last 24 hours) at 06/01/2018 1221 Last data filed at 06/01/2018 8676 Gross per 24 hour  Intake 1965 ml  Output 685 ml  Net 1280 ml   Filed Weights   05/27/18 1410 05/28/18 0758  Weight: 88.5 kg (195 lb) 88.5 kg (195 lb)    Examination:   General: Patient appears to be improving, but still appears weak Neurology: Awake and alert, non focal  E ENT: no pallor, + icterus, oral mucosa moist Cardiovascular: No JVD. S1-S2 present, rhythmic, no gallops, rubs, or murmurs. No lower extremity edema. Pulmonary: vesicular breath sounds bilaterally, adequate air movement, no wheezing, rhonchi or rales. Gastrointestinal. Abdomen diffusely tender, previously healed midline scar, no rebound or guarding Skin. No rashes Musculoskeletal:  no joint deformities     Data Reviewed: I have personally reviewed following labs and imaging studies  CBC: Recent Labs  Lab 05/26/18 1548 05/27/18 1435 05/28/18 0352 05/29/18 0442  WBC 8.5 9.1 7.8 10.6*  NEUTROABS 5.7 6.4  --   --   HGB 14.2 13.1 12.4*  12.6*  HCT 41.7 38.9* 37.8* 38.2*  MCV 90.0 91.7 92.0 92.3  PLT 383.0 406* 409* 277*   Basic Metabolic Panel: Recent Labs  Lab 05/28/18 0352 05/29/18 0442 05/30/18 0440 05/31/18 0419 06/01/18 0615  NA 137 137 138 138 137  K 3.9 3.7 3.8 3.8 3.7  CL 97* 99* 100* 101 99*  CO2 33* 29 30 28 29   GLUCOSE 112* 171* 128* 117* 123*  BUN 7 7 6 7 6   CREATININE 0.59* 0.62 0.53* 0.54* 0.58*  CALCIUM 8.5* 8.5* 8.4* 8.8* 8.5*   GFR: Estimated Creatinine Clearance: 112.4 mL/min (A) (by C-G formula based on SCr of 0.58 mg/dL (L)). Liver Function Tests: Recent Labs  Lab 05/28/18 0352 05/29/18 0442 05/30/18 0440 05/31/18 0419 06/01/18 0615  AST 196* 254* 246* 222* 243*  ALT 234* 251* 257* 246* 234*  ALKPHOS 317* 365* 388* 454* 496*  BILITOT 9.4* 8.5* 8.8* 9.7* 9.5*  PROT 6.9 6.8 6.6 6.9 6.4*  ALBUMIN 3.3* 3.2* 3.0* 2.9* 2.6*   Recent Labs  Lab 05/26/18 1548 05/27/18 1435  LIPASE 30.0 32   No results for input(s): AMMONIA in the last 168 hours. Coagulation Profile: Recent Labs  Lab 05/26/18 1548 05/31/18 0419  INR 1.2* 1.04   Cardiac Enzymes: No results for input(s): CKTOTAL, CKMB, CKMBINDEX, TROPONINI in the last 168 hours. BNP (last 3 results) No results for input(s): PROBNP in the last 8760 hours. HbA1C: No results for input(s): HGBA1C in the last 72 hours. CBG: No results for input(s): GLUCAP in the last 168 hours. Lipid Profile: No results for input(s): CHOL, HDL, LDLCALC, TRIG, CHOLHDL, LDLDIRECT in the last 72 hours. Thyroid Function Tests: No results for input(s): TSH, T4TOTAL, FREET4, T3FREE, THYROIDAB in the last 72 hours. Anemia Panel: No results for input(s): VITAMINB12, FOLATE, FERRITIN, TIBC, IRON, RETICCTPCT in the last 72 hours.    Radiology Studies: I have reviewed all of the imaging during this hospital visit personally     Scheduled Meds: . enoxaparin (LOVENOX) injection  40 mg Subcutaneous Q24H  . fentaNYL  25 mcg Transdermal Q72H  .  fluconazole  200 mg Oral Daily  . indomethacin  100 mg Rectal Once  . loratadine  10 mg Oral Daily  . multivitamin-iron-minerals-folic acid  1 tablet Oral Daily  . naloxegol oxalate  12.5 mg Oral Daily  . ondansetron  4 mg Oral TID  . pantoprazole  40 mg Oral BID  . polyethylene glycol  17 g Oral TID  . senna-docusate  1 tablet Oral BID  . sodium chloride flush  5 mL Intracatheter Q8H  . sucralfate  1 g Oral TID WC & HS  . vitamin B-12  1,000 mcg Oral Daily   Continuous Infusions: . sodium chloride 75 mL/hr at 06/01/18 0400     LOS: 5 days    Carita Pian, PA-S   [facility] Pager [phone]

## 2018-06-02 ENCOUNTER — Ambulatory Visit: Payer: 59 | Admitting: Physician Assistant

## 2018-06-02 DIAGNOSIS — R945 Abnormal results of liver function studies: Secondary | ICD-10-CM

## 2018-06-02 DIAGNOSIS — C799 Secondary malignant neoplasm of unspecified site: Secondary | ICD-10-CM

## 2018-06-02 DIAGNOSIS — K831 Obstruction of bile duct: Secondary | ICD-10-CM

## 2018-06-02 DIAGNOSIS — C259 Malignant neoplasm of pancreas, unspecified: Secondary | ICD-10-CM

## 2018-06-02 DIAGNOSIS — K869 Disease of pancreas, unspecified: Secondary | ICD-10-CM

## 2018-06-02 DIAGNOSIS — C801 Malignant (primary) neoplasm, unspecified: Secondary | ICD-10-CM

## 2018-06-02 LAB — CBC WITH DIFFERENTIAL/PLATELET
BASOS PCT: 0 %
Basophils Absolute: 0 10*3/uL (ref 0.0–0.1)
EOS ABS: 0.5 10*3/uL (ref 0.0–0.7)
EOS PCT: 5 %
HCT: 36.2 % — ABNORMAL LOW (ref 39.0–52.0)
HEMOGLOBIN: 12.1 g/dL — AB (ref 13.0–17.0)
LYMPHS ABS: 1.4 10*3/uL (ref 0.7–4.0)
Lymphocytes Relative: 13 %
MCH: 30.1 pg (ref 26.0–34.0)
MCHC: 33.4 g/dL (ref 30.0–36.0)
MCV: 90 fL (ref 78.0–100.0)
Monocytes Absolute: 1.2 10*3/uL — ABNORMAL HIGH (ref 0.1–1.0)
Monocytes Relative: 12 %
NEUTROS PCT: 70 %
Neutro Abs: 7.3 10*3/uL (ref 1.7–7.7)
PLATELETS: 532 10*3/uL — AB (ref 150–400)
RBC: 4.02 MIL/uL — ABNORMAL LOW (ref 4.22–5.81)
RDW: 14.6 % (ref 11.5–15.5)
WBC: 10.4 10*3/uL (ref 4.0–10.5)

## 2018-06-02 LAB — COMPREHENSIVE METABOLIC PANEL
ALBUMIN: 3.2 g/dL — AB (ref 3.5–5.0)
ALK PHOS: 585 U/L — AB (ref 38–126)
ALT: 248 U/L — ABNORMAL HIGH (ref 17–63)
ANION GAP: 14 (ref 5–15)
AST: 245 U/L — ABNORMAL HIGH (ref 15–41)
BUN: 9 mg/dL (ref 6–20)
CHLORIDE: 100 mmol/L — AB (ref 101–111)
CO2: 25 mmol/L (ref 22–32)
Calcium: 9 mg/dL (ref 8.9–10.3)
Creatinine, Ser: 0.74 mg/dL (ref 0.61–1.24)
GFR calc non Af Amer: >60 mL/min (ref >60)
GLUCOSE: 129 mg/dL — AB (ref 65–99)
Potassium: 3.5 mmol/L (ref 3.5–5.1)
SODIUM: 139 mmol/L (ref 135–145)
Total Bilirubin: 11.1 mg/dL — ABNORMAL HIGH (ref 0.3–1.2)
Total Protein: 7.6 g/dL (ref 6.5–8.1)

## 2018-06-02 MED ORDER — LEUCOVORIN CALCIUM INJECTION 350 MG
400.0000 mg/m2 | Freq: Once | INTRAVENOUS | Status: AC
  Administered 2018-06-02: 844 mg via INTRAVENOUS
  Filled 2018-06-02: qty 42.2

## 2018-06-02 MED ORDER — MORPHINE SULFATE (PF) 4 MG/ML IV SOLN
3.0000 mg | INTRAVENOUS | Status: DC | PRN
  Administered 2018-06-02 – 2018-06-04 (×7): 3 mg via INTRAVENOUS
  Filled 2018-06-02 (×8): qty 1

## 2018-06-02 MED ORDER — SODIUM CHLORIDE 0.9% FLUSH: 3.0000 mL | INTRAVENOUS | Status: DC | PRN

## 2018-06-02 MED ORDER — PALONOSETRON HCL INJECTION 0.25 MG/5ML
0.2500 mg | Freq: Once | INTRAVENOUS | Status: AC
  Administered 2018-06-02: 0.25 mg via INTRAVENOUS
  Filled 2018-06-02: qty 5

## 2018-06-02 MED ORDER — ALTEPLASE 2 MG IJ SOLR: 2.0000 mg | Freq: Once | INTRAMUSCULAR | Status: DC | PRN

## 2018-06-02 MED ORDER — SODIUM CHLORIDE 0.9% FLUSH: 10.0000 mL | INTRAVENOUS | Status: DC | PRN

## 2018-06-02 MED ORDER — HEPARIN SOD (PORK) LOCK FLUSH 100 UNIT/ML IV SOLN: 250.0000 [IU] | Freq: Once | INTRAVENOUS | Status: DC | PRN

## 2018-06-02 MED ORDER — HOT PACK MISC ONCOLOGY
1.0000 | Freq: Once | Status: AC | PRN
  Filled 2018-06-02: qty 1

## 2018-06-02 MED ORDER — HEPARIN SOD (PORK) LOCK FLUSH 100 UNIT/ML IV SOLN
500.0000 [IU] | Freq: Once | INTRAVENOUS | Status: DC | PRN
  Filled 2018-06-02: qty 5

## 2018-06-02 MED ORDER — FLUOROURACIL CHEMO INJECTION 5 GM/100ML
1200.0000 mg/m2 | Freq: Once | INTRAVENOUS | Status: AC
  Administered 2018-06-02: 2550 mg via INTRAVENOUS
  Filled 2018-06-02: qty 51

## 2018-06-02 MED ORDER — COLD PACK MISC ONCOLOGY
1.0000 | Freq: Once | Status: AC | PRN
  Filled 2018-06-02: qty 1

## 2018-06-02 MED ORDER — SODIUM CHLORIDE 0.9 % IV SOLN
10.0000 mg | Freq: Once | INTRAVENOUS | Status: AC
  Administered 2018-06-02: 10 mg via INTRAVENOUS
  Filled 2018-06-02: qty 1

## 2018-06-02 MED ORDER — DEXTROSE 5 % IV SOLN
85.0000 mg/m2 | Freq: Once | INTRAVENOUS | Status: AC
  Administered 2018-06-02: 180 mg via INTRAVENOUS
  Filled 2018-06-02: qty 36

## 2018-06-02 MED ORDER — DEXTROSE 5 % IV SOLN
Freq: Once | INTRAVENOUS | Status: AC
  Administered 2018-06-02: 11:00:00 via INTRAVENOUS

## 2018-06-02 MED ORDER — PIPERACILLIN-TAZOBACTAM 3.375 G IVPB
3.3750 g | Freq: Once | INTRAVENOUS | Status: AC
  Filled 2018-06-02: qty 50

## 2018-06-02 NOTE — Progress Notes (Signed)
Unfortunately, Mr. [name] is have his bilirubin go up.  He did have a CT scan done last night.  It looks like there might be some biliary dilatation.  Hopefully, radiology can go in and reposition the catheter so that his bile can be drained and the bilirubin can go down.  He clearly is in need of systemic therapy.  I do not think that we have time to wait for her to treat him as his liver tests are declining.  We will go ahead and try FOLFOX.  Thankfully, oxaliplatin dosing does not had to be adjusted for hepatic insufficiency.  We will have to dose adjust the 5-FU downward.  I will give him only 50% of the dose.  He still has some discomfort.  He says that the oxycodone makes him very constipated.  The morphine does not.  I will stop the oxycodone and just use morphine for pain control.  His CBC looks okay.  He has had no bleeding.  He has been trying to get out of bed and walk a little bit.  I talked to him at length about chemotherapy.  He is ready to start this.  Again, I think that we have to get started to try to get some regression of his cancer within the liver so that his liver functions will improve.  May be, radiology will be able to re-position the biliary stent so that we can get better drainage of his biliary system.  Again, this is a complex case.  Mr. [name] is doing a great job.  He has a good attitude.  His family is helping him.  Lattie Haw, MD

## 2018-06-02 NOTE — Progress Notes (Addendum)
Referring Physician(s): Ennever,P  Supervising Physician: Corrie Mckusick  Patient Status:  [name] - In-pt  Chief Complaint:  Adenocarcinoma (GI primary)   Subjective: Patient currently without significant abdominal pain, nausea, vomiting.  Liver function tests remain elevated.  Currently receiving chemotherapy.   Allergies: Cephalexin; Other; Latex; and Tape  Medications: Prior to Admission medications   Medication Sig Start Date End Date Taking? Authorizing Provider  cetirizine (ZYRTEC) 10 MG tablet Take 10 mg by mouth daily.     Yes [provider]  Cyanocobalamin (VITAMIN B 12 PO) Take 1,000 mcg by mouth daily.   Yes [provider]  esomeprazole (NEXIUM) 20 MG capsule TAKE ONE CAPSULE BY MOUTH EVERY DAY 04/10/15  Yes Dorena Cookey, MD  flintstones complete (FLINTSTONES) 60 MG chewable tablet Chew 1 tablet by mouth daily.   Yes [provider]  halobetasol (ULTRAVATE) 0.05 % ointment APPLY TO AFFECTED AREA TWICE A DAY 04/20/18  Yes Kuneff, Renee A, DO  hydrocortisone cream 1 % Apply 1 application topically daily as needed for itching.   Yes [provider]  naproxen sodium (ALEVE) 220 MG tablet Take 440 mg by mouth daily as needed (pain).   Yes [provider]  ondansetron (ZOFRAN ODT) 4 MG disintegrating tablet Take 1 tablet (4 mg total) by mouth every 8 (eight) hours as needed for nausea or vomiting. 05/26/18  Yes Esterwood, Amy S, PA-C  oxyCODONE (OXY IR/ROXICODONE) 5 MG immediate release tablet Take 1 tablet (5 mg total) by mouth every 6 (six) hours as needed for severe pain. 05/24/18  Yes Kuneff, Renee A, DO  polyethylene glycol (MIRALAX / GLYCOLAX) packet Take 17 g by mouth daily.   Yes [provider]  sildenafil (REVATIO) 20 MG tablet 1-5 tabs PO prior to sexually activity. 04/20/18  Yes Kuneff, Renee A, DO     Vital Signs: BP 127/81 (BP Location: Left Arm)   Pulse 77   Temp 98.8 F (37.1 C) (Oral)   Resp 17   Ht 5'  11" (1.803 m)   Wt 195 lb (88.5 kg)   SpO2 96%   BMI 27.20 kg/m   Physical Exam awake/alert; chest- sl dim BS bases; clean, intact right chest wall Port-A-Cath ;heart- RRR; abdomen slightly distended, biliary drain intact, dressing clean and dry, site not significantly tender to palpation, output 410 cc green bile.  Drain irrigates without difficulty.  No lower extremity edema  Imaging: Ct Abdomen W Contrast  Result Date: 06/02/2018 CLINICAL DATA:  Concern for metastatic either pancreatic or gastric cancer with concern for obstructive malignant jaundiced post failed attempted ERCP subsequently undergoing ultrasound fluoroscopic guided placement of a percutaneous biliary drainage catheter on 05/26/2018. Unfortunately, the patient's LFTs continue to arise with cholangiogram demonstrating wide patency of the biliary drainage catheter but lack of substantial opacification of the intrahepatic biliary tree. Please evaluate for residual intrahepatic biliary ductal dilatation. EXAM: CT ABDOMEN WITH CONTRAST TECHNIQUE: Multidetector CT imaging of the abdomen was performed using the standard protocol following bolus administration of intravenous contrast. CONTRAST:  160m ISOVUE-300 IOPAMIDOL (ISOVUE-300) INJECTION 61% COMPARISON:  CT chest, abdomen and pelvis - 05/27/2018; ultrasound fluoroscopic guided percutaneous biliary drainage catheter placement-05/26/2018 FINDINGS: Lower chest: Limited visualization of the lower thorax demonstrates development of trace bilateral pleural effusions, left greater than right. Minimal subsegmental atelectasis within the bilateral lower lobes. Normal heart size. Trace amount of pericardial fluid, presumably physiologic. Hepatobiliary: Interval placement of a right anterior biliary percutaneous drainage catheter with end coiled within the duodenum  and radiopaque marker at the level the biliary hilum. Despite appropriate positioning of the percutaneous biliary drainage catheter,  there has been no substantial change to potential slight worsening of previously noted mild intrahepatic biliary ductal dilatation. Normal hepatic contour. Loculated subcapsular fluid collection about the posterior aspect the right lobe of the liver has minimally increased in size in the interval, currently measuring 5.5 x 6.6 x 7.8 cm as measured in greatest oblique short axis axial (image 26, series 2) and coronal (image 80, series 5) dimensions respectively, previously, 5.7 x 3.4 x 4.6 cm. Geographic area of hypoattenuation about the subcapsular aspect of the caudal tip of the right lobe of the liver likely represents the sequela of recent percutaneous biliary drainage catheter placement and or streak artifact from the adjacent rib (image 33, series 2). No definitive discrete intrahepatic lesions. Portal vein appears patent. Pancreas: The distal end of the pancreatic tail is again inseparable from ill-defined peripherally enhancing loculated fluid within the left upper abdominal quadrant (representative axial image 19, series 2, coronal image 73, series 5), favored to represent secondary involvement as the remainder of the pancreas appears normal. Spleen: Absent. Adrenals/Urinary Tract: There is symmetric enhancement and excretion of the bilateral kidneys. No definite renal stones this postcontrast examination. No discrete renal lesions. No urine obstruction or perinephric stranding. Normal appearance the bilateral adrenal glands. The urinary bladder was not imaged. Stomach/Bowel: Read demonstrated multiple apparently loculated fluid collections about the esophagus, stomach and porta hepatis. Dominant fluid collection about the distal aspect of the esophagus at the level of the diaphragmatic hiatus measures approximately 4.1 x 2.9 cm (axial image 13, series 2), dominant bilobed fluid collection about the greater curvature of the stomach measures approximately 7.0 x 3.2 cm (image 24, series 2) and dominant  collection adjacent to the anterior aspect of the IVC measures approximately 2.6 x 2.4 cm (image 26, series 2), all of which appear grossly unchanged compared to the 05/2018 examination. Vascular/Lymphatic: Normal caliber of the abdominal aorta. The major branch vessels of the abdominal aorta appear patent. Evaluation for abdominal adenopathy is degraded secondary to the complex irregular fluid collections within the imaged upper abdomen. Read demonstrated findings worrisome for mental caking about the ventral aspect of the mid abdomen (axial image 47 and 56, series 2). Other: Minimal amount of right lateral body wall subcutaneous edema adjacent to the percutaneous biliary drainage catheter. Musculoskeletal: No definite acute or aggressive osseous abnormalities. Re-demonstrated bilateral old/healed rib fractures. IMPRESSION: 1. Appropriately positioned percutaneous biliary drainage catheter with no change to slight worsening of intrahepatic biliary ductal dilatation. 2. Slight increase in size of subcapsular fluid collection about the posterior aspect of the right lobe of the liver, currently measuring 7.8 cm, previously, 5.6 cm. Otherwise, grossly unchanged findings of extensive metastatic disease within the imaged upper abdomen without significant interval change. PLAN: - above discussed with Dr. Earleen Newport (IR MD at [facility] today). - given above findings, patient will undergo fluoroscopic guided cholangiogram and percutaneous biliary drainage catheter exchange and potential up sizing. Electronically Signed   By: Sandi Mariscal M.D.   On: 06/02/2018 09:12   US Abdomen Limited  Result Date: 05/29/2018 CLINICAL DATA:  57 year old male with a history of questionable ascites. EXAM: LIMITED ABDOMEN ULTRASOUND FOR ASCITES TECHNIQUE: Limited ultrasound survey for ascites was performed in all four abdominal quadrants. COMPARISON:  None. FINDINGS: Limited ultrasound survey of the abdomen demonstrates no significant ascites.  IMPRESSION: Sonographic survey demonstrates no significant ascites. Electronically Signed   By: Corrie Mckusick  D.O.   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 Unfortunately, the patient continues to suffer from persistently elevated LFTs as such request made for cholangiogram via the internal/external drainage catheter evaluate for drainage catheter functionality. EXAM: CHOLANGIOGRAM VIA EXISTING INTERNAL/EXTERNAL PERCUTANEOUS BILIARY DRAINAGE CATHETER COMPARISON:  COMPARISON Ultrasound fluoroscopic guided percutaneous biliary drainage catheter placement - 05/26/2018; CT abdomen and pelvis - 05/19/2018 CONTRAST:  20 cc Isovue-300 administered via the biliary drainage catheter FLUOROSCOPY TIME:  1 minute 12 seconds (8.5 mGy) COMPLICATIONS: None immediate. TECHNIQUE: Informed written consent was obtained from the patient after a discussion of the risks, benefits and alternatives to treatment. Questions regarding the procedure were encouraged and answered. A timeout was performed prior to the initiation of the procedure. Patient was positioned supine on the fluoroscopy table. Preprocedural spot fluoroscopic image was obtained of the right upper abdominal quadrant existing percutaneous drainage catheter. Multiple spot fluoroscopic and radiographic images were obtained in various obliquities following the injection of a small amount contrast. Images reviewed in the procedure was terminated. The percutaneous biliary drainage catheter was flushed and reconnected to a gravity bag. A dressing was placed. The patient tolerated the procedure well without immediate postprocedural complication. FINDINGS: The existing percutaneous  biliary catheter is appropriately positioned and functioning. Contrast injection demonstrates brisk passage of contrast through the biliary drainage catheter to the level of the duodenum. There is no definitive opacification of the intrahepatic biliary tree despite vigorous contrast injection. IMPRESSION: Appropriately positioned and functioning right hepatic approach percutaneous biliary drainage catheter. PLAN: My assumption is the lack of opacification of the intrahepatic biliary tree is secondary to the lack of any significant intrahepatic biliary ductal dilatation as was suggested both on the preceding abdominal CT performed 05/19/2018 as well as the ultrasound and fluoroscopic guided biliary drainage catheter placement performed 05/26/2018. To confirm this assumption, I will obtain a IV only CT scan of the abdomen. If there is no significant intrahepatic biliary duct dilatation, the etiology of the patient's elevated LFTs is likely secondary to a non obstructive etiology. If intrahepatic biliary duct dilatation is seen on the abdominal CT, would pursue fluoroscopic guided biliary drainage catheter exchange and potential up sizing. Electronically Signed   By: Sandi Mariscal M.D.   On: 06/01/2018 17:33   Ir Imaging Guided Port Insertion  Result Date: 05/31/2018 INDICATION: History of metastatic pancreatic cancer, in need of durable intravenous access for chemotherapy administration. EXAM: IMPLANTED PORT A CATH PLACEMENT WITH ULTRASOUND AND FLUOROSCOPIC GUIDANCE COMPARISON:  CT the chest, abdomen pelvis - 05/27/2018 MEDICATIONS: Cleocin 900 mg IV; The antibiotic was administered within an appropriate time interval prior to skin puncture. ANESTHESIA/SEDATION: Moderate (conscious) sedation was employed during this procedure. A total of Versed 2 mg and Fentanyl 100 mcg was administered intravenously. Moderate Sedation Time: 27 minutes. The patient's level of consciousness and vital signs were monitored continuously  by radiology nursing throughout the procedure under my direct supervision. CONTRAST:  None FLUOROSCOPY TIME:  12 seconds (9 mGy) COMPLICATIONS: None immediate. PROCEDURE: The procedure, risks, benefits, and alternatives were explained to the patient. Questions regarding the procedure were encouraged and answered. The patient understands and consents to the procedure. The right neck and chest were prepped with chlorhexidine in a sterile fashion, and a sterile drape was applied covering the  operative field. Maximum barrier sterile technique with sterile gowns and gloves were used for the procedure. A timeout was performed prior to the initiation of the procedure. Local anesthesia was provided with 1% lidocaine with epinephrine. After creating a small venotomy incision, a micropuncture kit was utilized to access the internal jugular vein. Real-time ultrasound guidance was utilized for vascular access including the acquisition of a permanent ultrasound image documenting patency of the accessed vessel. The microwire was utilized to measure appropriate catheter length. A subcutaneous port pocket was then created along the upper chest wall utilizing a combination of sharp and blunt dissection. The pocket was irrigated with sterile saline. A single lumen thin power injectable port was chosen for placement. The 8 Fr catheter was tunneled from the port pocket site to the venotomy incision. The port was placed in the pocket. The external catheter was trimmed to appropriate length. At the venotomy, an 8 Fr peel-away sheath was placed over a guidewire under fluoroscopic guidance. The catheter was then placed through the sheath and the sheath was removed. Final catheter positioning was confirmed and documented with a fluoroscopic spot radiograph. The port was accessed with a Huber needle, aspirated and flushed with heparinized saline. The venotomy site was closed with an interrupted 4-0 Vicryl suture. The port pocket incision was  closed with interrupted 2-0 Vicryl suture and the skin was opposed with a running subcuticular 4-0 Vicryl suture. Dermabond and Steri-strips were applied to both incisions. Dressings were placed. The patient tolerated the procedure well without immediate post procedural complication. FINDINGS: After catheter placement, the tip lies within the superior cavoatrial junction. The catheter aspirates and flushes normally and is ready for immediate use. IMPRESSION: Successful placement of a right internal jugular approach power injectable Port-A-Cath. The catheter is ready for immediate use. Electronically Signed   By: Sandi Mariscal M.D.   On: 05/31/2018 11:41    Labs:  CBC: Recent Labs    05/27/18 1435 05/28/18 0352 05/29/18 0442 06/02/18 0518  WBC 9.1 7.8 10.6* 10.4  HGB 13.1 12.4* 12.6* 12.1*  HCT 38.9* 37.8* 38.2* 36.2*  PLT 406* 409* 429* 532*    COAGS: Recent Labs    05/26/18 1548 05/31/18 0419  INR 1.2* 1.04    BMP: Recent Labs    05/30/18 0440 05/31/18 0419 06/01/18 0615 06/02/18 0518  NA 138 138 137 139  K 3.8 3.8 3.7 3.5  CL 100* 101 99* 100*  CO2 _0 GLUCOSE 128* 117* 123* 129*  BUN _1 CALCIUM 8.4* 8.8* 8.5* 9.0  CREATININE 0.53* 0.54* 0.58* 0.74  GFRNONAA >60 >60 >60 >60  GFRAA >60 >60 >60 >60    LIVER FUNCTION TESTS: Recent Labs    05/30/18 0440 05/31/18 0419 06/01/18 0615 06/02/18 0518  BILITOT 8.8* 9.7* 9.5* 11.1*  AST 246* 222* 243* 245*  ALT 257* 246* 234* 248*  ALKPHOS 388* 454* 496* 585*  PROT 6.6 6.9 6.4* 7.6  ALBUMIN 3.0* 2.9* 2.6* 3.2*    Assessment and Plan: Patient with history of recently diagnosed adenocarcinoma of probable GI primary with associated elevated LFTs, mild biliary ductal dilatation, status post internal/external biliary drain placement on 05/28/2018; afebrile, WBC normal, hemoglobin stable, creatinine normal, T bili 11.1 up from 9.5; chemotherapy initiated today; latest imaging reveals appropriate positioning of  biliary drain catheter with no change to slight worsening of intrahepatic bili ductal dilatation.  Also slight increase in size of subcapsular fluid collection about the posterior aspect right lobe of  liver.  Continue to monitor labs.  Will tent plan on bili drain exchange/upsizing on 6/20.  Above plans discussed with patient with his understanding and consent. Lovenox will need to be held until after procedure.    Electronically Signed: D. Rowe Robert, PA-C 06/02/2018, 12:27 PM   I spent a total of 15 minutes at the the patient's bedside AND on the patient's hospital floor or unit, greater than 50% of which was counseling/coordinating care for biliary drain    Patient ID: [name], male   DOB: 1961/04/05, 57 y.o.   MRN: [id number]

## 2018-06-02 NOTE — Progress Notes (Addendum)
Oxaliplatin, Leucovorin and Fluorouracil dosages and calculations verified with Aldean Baker, RN.

## 2018-06-02 NOTE — Progress Notes (Signed)
Patient and parents educated regarding chemotherapy that he will receive today.  Medications and side effects of medications discussed and patient and his parents all verbalized understanding.  No further questions currently.

## 2018-06-02 NOTE — Progress Notes (Signed)
Pt tolerated oxaliplatin and leukovorin well. No complaints at this time. Will continue to monitor.

## 2018-06-02 NOTE — Progress Notes (Signed)
PROGRESS NOTE    [name]  HAL:[id number] DOB: 31-Mar-1961 DOA: 05/27/2018 PCP: Ma Hillock, DO  Brief Narrative:57 year old male with history of squamous cell carcinoma, GERD, and chronic constipation who was admitted for abnormal abdominal CT scan he had as a result of abdominal discomfort. His CT revealed a mass around the pancreas and stomach. An EGD was done with biopsy, pathology reports the mass is gastric in origin . He also had a transaminitis on admission so a percutaneous biliary drain was placed by IR in hope to decrease LFTs. Patient reports decrease in nausea, however LFTs remain elevated. 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  CT evidence of infiltrative mass Spreading from stomach and the pancreas.  Port-A-Cath placed 06/01/2018.  Patient to start his new chemo regimen today.  2] elevated liver function test bilirubin higher than yesterday.  Status post biliary drain placement still with a lot of drainage.  IR following.  3] constipation resolved stool softeners stopped due to diarrhea.      DVT prophylaxis: Lovenox Code Status full code Family Communication: Mother was in the room when I was with the patient.  Disposition Plan: He wants to be discharged Friday morning the latest so he can attend the birthday party son turning 62.  Consultants: GI, IR, oncology  Procedures: Port-A-Cath placed 05/31/2017, biliary catheter placed 06/01/2018 Antimicrobials: None Subjective: He does not have any new complaints he is anxious and concerned about the chemo and going home concerned about the increasing liver  enzymes   Objective: Vitals:   06/01/18 1258 06/01/18 2048 06/01/18 2105 06/02/18 0549  BP: 117/85  122/79 127/81  Pulse: 77 82 76 77  Resp: 16  16 17   Temp: 97.6 F (36.4 C)  99.1 F (37.3 C) 98.8 F (37.1 C)  TempSrc: Oral  Oral Oral  SpO2: 97% 98% 96% 96%  Weight:      Height:        Intake/Output Summary (Last 24 hours) at 06/02/2018 1159 Last data filed at 06/02/2018 0900 Gross per 24 hour  Intake 1090 ml  Output 410 ml  Net 680 ml   Filed Weights   05/27/18 1410 05/28/18 0758  Weight: 88.5 kg (195 lb) 88.5 kg (195 lb)    Examination:  General exam: Appears calm and comfortable  Respiratory system: Clear to auscultation. Respiratory effort normal. Cardiovascular system: S1 & S2 heard, RRR. No JVD, murmurs, rubs, gallops or clicks. No pedal edema. Gastrointestinal system: Abdomen is nondistended, soft and nontender.  Biliary drain in place draining greenish drainage. Central nervous system: Alert and oriented. No focal neurological deficits. Extremities: Symmetric 5 x 5 power. Skin: No rashes, lesions or ulcers Psychiatry: Judgement and insight appear normal. Mood & affect appropriate.     Data Reviewed: I have personally reviewed following labs and imaging studies  CBC: Recent Labs  Lab 05/26/18 1548 05/27/18 1435 05/28/18 0352 05/29/18 0442 06/02/18 0518  WBC 8.5 9.1 7.8 10.6* 10.4  NEUTROABS 5.7 6.4  --   --  7.3  HGB 14.2 13.1 12.4* 12.6* 12.1*  HCT 41.7 38.9* 37.8* 38.2* 36.2*  MCV 90.0 91.7 92.0 92.3 90.0  PLT 383.0 406* 409* 429* 502*   Basic Metabolic Panel: Recent Labs  Lab 05/29/18 0442 05/30/18 0440 05/31/18 0419 06/01/18 0615 06/02/18 0518  NA 137 138 138 137 139  K 3.7 3.8 3.8 3.7 3.5  CL 99* 100* 101 99* 100*  CO2 29 30 28 29 25   GLUCOSE 171* 128* 117* 123* 129*  BUN 7 6 7 6 9   CREATININE 0.62 0.53* 0.54* 0.58* 0.74  CALCIUM 8.5* 8.4* 8.8* 8.5* 9.0   GFR: Estimated Creatinine Clearance: 112.4 mL/min (by C-G formula based  on SCr of 0.74 mg/dL). Liver Function Tests: Recent Labs  Lab 05/29/18 0442 05/30/18 0440 05/31/18 0419 06/01/18 0615 06/02/18 0518  AST 254* 246* 222* 243* 245*  ALT 251* 257* 246* 234* 248*  ALKPHOS 365* 388* 454* 496* 585*  BILITOT 8.5* 8.8* 9.7* 9.5* 11.1*  PROT 6.8 6.6 6.9 6.4* 7.6  ALBUMIN 3.2* 3.0* 2.9* 2.6* 3.2*   Recent Labs  Lab 05/26/18 1548 05/27/18 1435  LIPASE 30.0 32   No results for input(s): AMMONIA in the last 168 hours. Coagulation Profile: Recent Labs  Lab 05/26/18 1548 05/31/18 0419  INR 1.2* 1.04   Cardiac Enzymes: No results for input(s): CKTOTAL, CKMB, CKMBINDEX, TROPONINI in the last 168 hours. BNP (last 3 results) No results for input(s): PROBNP in the last 8760 hours. HbA1C: No results for input(s): HGBA1C in the last 72 hours. CBG: No results for input(s): GLUCAP in the last 168 hours. Lipid Profile: No results for input(s): CHOL, HDL, LDLCALC, TRIG, CHOLHDL, LDLDIRECT in the last 72 hours. Thyroid Function Tests: No results for input(s): TSH, T4TOTAL, FREET4, T3FREE, THYROIDAB in the last 72 hours. Anemia Panel: No results for input(s): VITAMINB12, FOLATE, FERRITIN, TIBC, IRON, RETICCTPCT in the last 72 hours. Sepsis Labs: No results for input(s): PROCALCITON, LATICACIDVEN in the last 168 hours.  No results found for this or any previous visit (from the past 240 hour(s)).       Radiology Studies: Ct Abdomen W Contrast  Result Date: 06/02/2018 CLINICAL DATA:  Concern for metastatic either pancreatic or gastric cancer with concern for obstructive malignant jaundiced post failed attempted ERCP subsequently undergoing ultrasound fluoroscopic guided placement of a percutaneous biliary drainage catheter on 05/26/2018. Unfortunately, the patient's LFTs continue to arise with cholangiogram demonstrating wide patency of the biliary drainage catheter but lack of substantial opacification of the intrahepatic biliary tree. Please evaluate for  residual intrahepatic biliary ductal dilatation. EXAM: CT ABDOMEN WITH CONTRAST TECHNIQUE: Multidetector CT imaging of the abdomen was performed using the standard protocol following bolus administration of intravenous contrast. CONTRAST:  185mL ISOVUE-300 IOPAMIDOL (ISOVUE-300) INJECTION 61% COMPARISON:  CT chest, abdomen and pelvis - 05/27/2018; ultrasound fluoroscopic guided percutaneous biliary drainage catheter placement-05/26/2018 FINDINGS: Lower chest: Limited visualization of the lower thorax demonstrates development of trace bilateral pleural effusions, left greater than right. Minimal subsegmental atelectasis within the bilateral lower lobes. Normal heart size. Trace amount of pericardial fluid, presumably physiologic. Hepatobiliary: Interval placement of a right anterior biliary percutaneous drainage catheter with end coiled within the duodenum and radiopaque marker at the level the biliary hilum. Despite appropriate positioning of the percutaneous biliary drainage catheter, there has been no substantial change to potential slight worsening of previously noted mild intrahepatic biliary ductal dilatation. Normal hepatic contour. Loculated subcapsular fluid collection about the posterior aspect the right lobe of the liver has minimally increased in size in the interval, currently measuring 5.5 x 6.6 x 7.8 cm as measured in greatest oblique short axis axial (image 26,  series 2) and coronal (image 80, series 5) dimensions respectively, previously, 5.7 x 3.4 x 4.6 cm. Geographic area of hypoattenuation about the subcapsular aspect of the caudal tip of the right lobe of the liver likely represents the sequela of recent percutaneous biliary drainage catheter placement and or streak artifact from the adjacent rib (image 33, series 2). No definitive discrete intrahepatic lesions. Portal vein appears patent. Pancreas: The distal end of the pancreatic tail is again inseparable from ill-defined peripherally enhancing  loculated fluid within the left upper abdominal quadrant (representative axial image 19, series 2, coronal image 73, series 5), favored to represent secondary involvement as the remainder of the pancreas appears normal. Spleen: Absent. Adrenals/Urinary Tract: There is symmetric enhancement and excretion of the bilateral kidneys. No definite renal stones this postcontrast examination. No discrete renal lesions. No urine obstruction or perinephric stranding. Normal appearance the bilateral adrenal glands. The urinary bladder was not imaged. Stomach/Bowel: Read demonstrated multiple apparently loculated fluid collections about the esophagus, stomach and porta hepatis. Dominant fluid collection about the distal aspect of the esophagus at the level of the diaphragmatic hiatus measures approximately 4.1 x 2.9 cm (axial image 13, series 2), dominant bilobed fluid collection about the greater curvature of the stomach measures approximately 7.0 x 3.2 cm (image 24, series 2) and dominant collection adjacent to the anterior aspect of the IVC measures approximately 2.6 x 2.4 cm (image 26, series 2), all of which appear grossly unchanged compared to the 05/2018 examination. Vascular/Lymphatic: Normal caliber of the abdominal aorta. The major branch vessels of the abdominal aorta appear patent. Evaluation for abdominal adenopathy is degraded secondary to the complex irregular fluid collections within the imaged upper abdomen. Read demonstrated findings worrisome for mental caking about the ventral aspect of the mid abdomen (axial image 47 and 56, series 2). Other: Minimal amount of right lateral body wall subcutaneous edema adjacent to the percutaneous biliary drainage catheter. Musculoskeletal: No definite acute or aggressive osseous abnormalities. Re-demonstrated bilateral old/healed rib fractures. IMPRESSION: 1. Appropriately positioned percutaneous biliary drainage catheter with no change to slight worsening of intrahepatic  biliary ductal dilatation. 2. Slight increase in size of subcapsular fluid collection about the posterior aspect of the right lobe of the liver, currently measuring 7.8 cm, previously, 5.6 cm. Otherwise, grossly unchanged findings of extensive metastatic disease within the imaged upper abdomen without significant interval change. PLAN: - above discussed with Dr. Earleen Newport (IR MD at [facility] today). - given above findings, patient will undergo fluoroscopic guided cholangiogram and percutaneous biliary drainage catheter exchange and potential up sizing. Electronically Signed   By: Sandi Mariscal M.D.   On: 06/02/2018 09:12   Ir Cholangiogram Existing Tube  Result Date: 06/01/2018 INDICATION: Concern for metastatic gastric cancer with presumed malignant biliary obstruction post failed attempted ERCP and as such underwent ultrasound and fluoroscopic guided placement of a right hepatic approach biliary drainage catheter on 05/26/2018. Unfortunately, the patient continues to suffer from persistently elevated LFTs as such request made for cholangiogram via the internal/external drainage catheter evaluate for drainage catheter functionality. EXAM: CHOLANGIOGRAM VIA EXISTING INTERNAL/EXTERNAL PERCUTANEOUS BILIARY DRAINAGE CATHETER COMPARISON:  COMPARISON Ultrasound fluoroscopic guided percutaneous biliary drainage catheter placement - 05/26/2018; CT abdomen and pelvis - 05/19/2018 CONTRAST:  20 cc Isovue-300 administered via the biliary drainage catheter FLUOROSCOPY TIME:  1 minute 12 seconds (8.5 mGy) COMPLICATIONS: None immediate. TECHNIQUE: Informed written consent was obtained from the patient after a discussion of the risks, benefits and alternatives to treatment. Questions regarding the procedure were encouraged and  answered. A timeout was performed prior to the initiation of the procedure. Patient was positioned supine on the fluoroscopy table. Preprocedural spot fluoroscopic image was obtained of the right upper abdominal  quadrant existing percutaneous drainage catheter. Multiple spot fluoroscopic and radiographic images were obtained in various obliquities following the injection of a small amount contrast. Images reviewed in the procedure was terminated. The percutaneous biliary drainage catheter was flushed and reconnected to a gravity bag. A dressing was placed. The patient tolerated the procedure well without immediate postprocedural complication. FINDINGS: The existing percutaneous biliary catheter is appropriately positioned and functioning. Contrast injection demonstrates brisk passage of contrast through the biliary drainage catheter to the level of the duodenum. There is no definitive opacification of the intrahepatic biliary tree despite vigorous contrast injection. IMPRESSION: Appropriately positioned and functioning right hepatic approach percutaneous biliary drainage catheter. PLAN: My assumption is the lack of opacification of the intrahepatic biliary tree is secondary to the lack of any significant intrahepatic biliary ductal dilatation as was suggested both on the preceding abdominal CT performed 05/19/2018 as well as the ultrasound and fluoroscopic guided biliary drainage catheter placement performed 05/26/2018. To confirm this assumption, I will obtain a IV only CT scan of the abdomen. If there is no significant intrahepatic biliary duct dilatation, the etiology of the patient's elevated LFTs is likely secondary to a non obstructive etiology. If intrahepatic biliary duct dilatation is seen on the abdominal CT, would pursue fluoroscopic guided biliary drainage catheter exchange and potential up sizing. Electronically Signed   By: Sandi Mariscal M.D.   On: 06/01/2018 17:33        Scheduled Meds: . [START ON 06/03/2018] enoxaparin (LOVENOX) injection  40 mg Subcutaneous Q24H  . fentaNYL  25 mcg Transdermal Q72H  . fluconazole  200 mg Oral Daily  . FLUOROURACIL (ADRUCIL) CHEMO infusion For Inpatient Use  1,200  mg/m2 (Treatment Plan Recorded) Intravenous Once  . indomethacin  100 mg Rectal Once  . leucovorin (WELLCOVORIN) IV infusion  400 mg/m2 (Treatment Plan Recorded) Intravenous Once  . loratadine  10 mg Oral Daily  . multivitamin-iron-minerals-folic acid  1 tablet Oral Daily  . naloxegol oxalate  12.5 mg Oral Daily  . oxaliplatin (ELOXATIN) CHEMO IV infusion  85 mg/m2 (Treatment Plan Recorded) Intravenous Once  . pantoprazole  40 mg Oral BID  . polyethylene glycol  17 g Oral TID  . senna-docusate  1 tablet Oral BID  . sodium chloride flush  5 mL Intracatheter Q8H  . sucralfate  1 g Oral TID WC & HS  . vitamin B-12  1,000 mcg Oral Daily   Continuous Infusions: . dextrose       LOS: 6 days      Georgette Shell, MD [facility]  If 7PM-7AM, please contact night-coverage [email or web address] Password [email or web address] 06/02/2018, 11:59 AM

## 2018-06-03 ENCOUNTER — Inpatient Hospital Stay (HOSPITAL_COMMUNITY): Payer: 59

## 2018-06-03 ENCOUNTER — Encounter (HOSPITAL_COMMUNITY): Payer: Self-pay | Admitting: Diagnostic Radiology

## 2018-06-03 DIAGNOSIS — R0609 Other forms of dyspnea: Secondary | ICD-10-CM

## 2018-06-03 DIAGNOSIS — R945 Abnormal results of liver function studies: Secondary | ICD-10-CM

## 2018-06-03 HISTORY — PX: IR EXCHANGE BILIARY DRAIN: IMG6046

## 2018-06-03 LAB — COMPREHENSIVE METABOLIC PANEL
ALBUMIN: 2.7 g/dL — AB (ref 3.5–5.0)
ALK PHOS: 467 U/L — AB (ref 38–126)
ALT: 188 U/L — AB (ref 17–63)
AST: 159 U/L — AB (ref 15–41)
Anion gap: 10 (ref 5–15)
BUN: 10 mg/dL (ref 6–20)
CALCIUM: 8.2 mg/dL — AB (ref 8.9–10.3)
CO2: 26 mmol/L (ref 22–32)
CREATININE: 0.59 mg/dL — AB (ref 0.61–1.24)
Chloride: 99 mmol/L — ABNORMAL LOW (ref 101–111)
GFR calc Af Amer: >60 mL/min (ref >60)
GFR calc non Af Amer: >60 mL/min (ref >60)
Glucose, Bld: 276 mg/dL — ABNORMAL HIGH (ref 65–99)
Potassium: 3.4 mmol/L — ABNORMAL LOW (ref 3.5–5.1)
SODIUM: 135 mmol/L (ref 135–145)
Total Bilirubin: 9.1 mg/dL — ABNORMAL HIGH (ref 0.3–1.2)
Total Protein: 6.7 g/dL (ref 6.5–8.1)

## 2018-06-03 LAB — CBC WITH DIFFERENTIAL/PLATELET
BASOS PCT: 0 %
Basophils Absolute: 0 10*3/uL (ref 0.0–0.1)
EOS ABS: 0 10*3/uL (ref 0.0–0.7)
Eosinophils Relative: 0 %
HCT: 32.1 % — ABNORMAL LOW (ref 39.0–52.0)
HEMOGLOBIN: 10.7 g/dL — AB (ref 13.0–17.0)
Lymphocytes Relative: 10 %
Lymphs Abs: 1.2 10*3/uL (ref 0.7–4.0)
MCH: 30.5 pg (ref 26.0–34.0)
MCHC: 33.3 g/dL (ref 30.0–36.0)
MCV: 91.5 fL (ref 78.0–100.0)
Monocytes Absolute: 0.5 10*3/uL (ref 0.1–1.0)
Monocytes Relative: 4 %
NEUTROS ABS: 10.5 10*3/uL — AB (ref 1.7–7.7)
NEUTROS PCT: 86 %
Platelets: 503 10*3/uL — ABNORMAL HIGH (ref 150–400)
RBC: 3.51 MIL/uL — AB (ref 4.22–5.81)
RDW: 14.7 % (ref 11.5–15.5)
WBC: 12.2 10*3/uL — AB (ref 4.0–10.5)

## 2018-06-03 MED ORDER — LIDOCAINE HCL 1 % IJ SOLN
INTRAMUSCULAR | Status: AC
  Filled 2018-06-03: qty 20

## 2018-06-03 MED ORDER — PIPERACILLIN-TAZOBACTAM 3.375 G IVPB
INTRAVENOUS | Status: AC
  Administered 2018-06-03: 3.375 g
  Filled 2018-06-03: qty 50

## 2018-06-03 MED ORDER — FENTANYL CITRATE (PF) 100 MCG/2ML IJ SOLN
INTRAMUSCULAR | Status: AC
  Filled 2018-06-03: qty 2

## 2018-06-03 MED ORDER — PROCHLORPERAZINE EDISYLATE 10 MG/2ML IJ SOLN
25.0000 mg | Freq: Four times a day (QID) | INTRAMUSCULAR | Status: DC | PRN
  Filled 2018-06-03: qty 6

## 2018-06-03 MED ORDER — FENTANYL CITRATE (PF) 100 MCG/2ML IJ SOLN
INTRAMUSCULAR | Status: AC | PRN
  Administered 2018-06-03 (×2): 50 ug via INTRAVENOUS

## 2018-06-03 MED ORDER — MIDAZOLAM HCL 2 MG/2ML IJ SOLN
INTRAMUSCULAR | Status: AC | PRN
  Administered 2018-06-03: 2 mg via INTRAVENOUS
  Administered 2018-06-03: 1 mg via INTRAVENOUS

## 2018-06-03 MED ORDER — IOPAMIDOL (ISOVUE-300) INJECTION 61%
INTRAVENOUS | Status: AC
  Administered 2018-06-03: 15 mL
  Filled 2018-06-03: qty 50

## 2018-06-03 MED ORDER — ONDANSETRON HCL 4 MG/2ML IJ SOLN: 4.0000 mg | Freq: Four times a day (QID) | INTRAMUSCULAR | Status: DC | PRN

## 2018-06-03 MED ORDER — IOPAMIDOL (ISOVUE-300) INJECTION 61%
15.0000 mL | Freq: Once | INTRAVENOUS | Status: AC | PRN
  Administered 2018-06-03: 15 mL

## 2018-06-03 MED ORDER — MIDAZOLAM HCL 2 MG/2ML IJ SOLN
INTRAMUSCULAR | Status: AC
  Filled 2018-06-03: qty 4

## 2018-06-03 MED ORDER — PROCHLORPERAZINE EDISYLATE 10 MG/2ML IJ SOLN
10.0000 mg | Freq: Four times a day (QID) | INTRAMUSCULAR | Status: DC | PRN
  Administered 2018-06-03: 10 mg via INTRAVENOUS

## 2018-06-03 MED ORDER — PROCHLORPERAZINE EDISYLATE 10 MG/2ML IJ SOLN: 25.0000 mg | Freq: Four times a day (QID) | INTRAMUSCULAR | Status: DC | PRN

## 2018-06-03 MED ORDER — LIDOCAINE HCL (PF) 1 % IJ SOLN
INTRAMUSCULAR | Status: AC | PRN
  Administered 2018-06-03: 5 mL

## 2018-06-03 MED ORDER — PROCHLORPERAZINE EDISYLATE 10 MG/2ML IJ SOLN
10.0000 mg | Freq: Four times a day (QID) | INTRAMUSCULAR | Status: DC | PRN
Start: 2018-06-03 — End: 2018-06-04
  Administered 2018-06-04: 10 mg via INTRAVENOUS
  Filled 2018-06-03: qty 2

## 2018-06-03 NOTE — Procedures (Signed)
Cholangiogram was performed.  The existing tube was not filling the right hepatic ducts well.  Therefore, a new 12 Fr biliary tube with additional sideholes was placed.  The right hepatic ducts are filling and draining better with the additional sideholes.  Tube attached to gravity bag and will follow output and labs.

## 2018-06-03 NOTE — Progress Notes (Signed)
[name] had a really busy day yesterday.  As always, radiology did a fantastic job with him.  Hopefully they will be able to up size the biliary drain.  He started chemotherapy yesterday.  He is on dose reduced FOLFOX.  His appetite is a little bit better.  He is more amatory.  He denies any nausea.  Please had no diarrhea.  He is having regular bowel movements.  The discontinuing of oxycodone has helped his bowels.  There is been no fever.  He has had no bleeding.  There are no labs back as of yet.  Of note, I did get his pathology and send it off for genetic markers.  This was done from the office.  I probably will not have results back for another week or so.  He is still hoping to get home tomorrow.  It is his son's birthday.  His vital signs are all stable.  On his exam, he still has some scleral icterus.  He looks jaundiced but not as.  His lungs are clear.  Cardiac exam regular rate and rhythm.  He has no murmurs.  Abdomen is soft.  There is no guarding or rebound tenderness.  I cannot palpate any abdominal mass.  Extremities shows no clubbing, cyanosis or edema.  Hopefully, today, the biliary catheter will be upsized.  We will see if this can help drain more bile to help his bilirubin improve.  If we can improve his bilirubin, then we can hopefully dose him at a more normal dose.  Again, the staff on [address] are really doing a great job.  This is a very complicated situation.  He is getting very good care.  Lattie Haw, MD  2 Corinthians 12:9-10

## 2018-06-03 NOTE — Progress Notes (Signed)
PROGRESS NOTE    [name]  EYC:[id number] DOB: 06-22-1961 DOA: 05/27/2018 PCP: Ma Hillock, DO  Brief Narrative:57 year old male with history of squamous cell carcinoma, GERD, and chronic constipation who was admitted for abnormal abdominal CT scanhe had as a result of abdominal discomfort. His CT revealed a mass around the pancreas and stomach. An EGD was donewith biopsy, pathology reports the mass is gastric in origin.He also had a transaminitis on admission so apercutaneous biliary drain was placed by IR in hope to decrease LFTs. 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  CT evidence of infiltrative mass Spreading from stomach and the pancreas.  Port-A-Cath placed 06/01/2018.  Patient started new chemo regimen 06/02/2018.  Complains of feeling nausea.  He is adamant that he wants to be discharged tomorrow.  2] elevated liver function test bilirubin alkaline phosphatase AST ALT trending down.  IR to upsize his drainage tube today.  3] constipation resolved stool softeners stopped due to diarrhea.   DVT prophylaxis:  Code Status: Full code Family Communication: No family available Disposition Plan: He wants to be discharged tomorrow Consultants:  Oncology, GI, IR Procedures: Port-A-Cath placed 05/31/2018 biliary catheter placed 06/01/2018 Antimicrobials: None feels okay but starting to be nauseous.  Denies any vomiting. Subjective:   Objective: Vitals:   06/02/18 1321  06/02/18 1329 06/02/18 2132 06/03/18 0600  BP:  (!) 134/95 129/81 133/76  Pulse:  76 76 82  Resp:  16 14 16   Temp: 99.5 F (37.5 C) 98.6 F (37 C) 99.4 F (37.4 C) 98.3 F (36.8 C)  TempSrc: Oral Oral Oral Oral  SpO2:  96% 95% 97%  Weight:      Height:        Intake/Output Summary (Last 24 hours) at 06/03/2018 1048 Last data filed at 06/02/2018 1724 Gross per 24 hour  Intake 445.52 ml  Output 300 ml  Net 145.52 ml   Filed Weights   05/27/18 1410 05/28/18 0758  Weight: 88.5 kg (195 lb) 88.5 kg (195 lb)    Examination:  General exam: Appears calm and comfortable  Respiratory system: Clear to auscultation. Respiratory effort normal. Cardiovascular system: S1 & S2 heard, RRR. No JVD, murmurs, rubs, gallops or clicks. No pedal edema. Gastrointestinal system: Abdomen is nondistended, soft and nontender. No organomegaly or masses felt. Normal bowel sounds heard.HEPATIC DRAIN IN PLACE. Central nervous system: Alert and oriented. No focal neurological deficits. Extremities: Symmetric 5 x 5 power. Skin: No rashes, lesions or ulcers Psychiatry: Judgement and insight appear normal. Mood & affect appropriate.     Data Reviewed: I have personally reviewed following labs and imaging studies  CBC: Recent Labs  Lab 05/27/18 1435 05/28/18 0352 05/29/18 0442 06/02/18 0518 06/03/18 0500  WBC 9.1 7.8 10.6* 10.4 12.2*  NEUTROABS 6.4  --   --  7.3 10.5*  HGB 13.1 12.4* 12.6* 12.1* 10.7*  HCT 38.9* 37.8* 38.2* 36.2* 32.1*  MCV 91.7 92.0 92.3 90.0 91.5  PLT 406* 409* 429* 532* 149*   Basic Metabolic Panel:  Recent Labs  Lab 05/30/18 0440 05/31/18 0419 06/01/18 0615 06/02/18 0518 06/03/18 0500  NA 138 138 137 139 135  K 3.8 3.8 3.7 3.5 3.4*  CL 100* 101 99* 100* 99*  CO2 30 28 29 25 26   GLUCOSE 128* 117* 123* 129* 276*  BUN 6 7 6 9 10   CREATININE 0.53* 0.54* 0.58* 0.74 0.59*  CALCIUM 8.4* 8.8* 8.5* 9.0 8.2*   GFR: Estimated Creatinine Clearance: 112.4 mL/min (A) (by C-G  formula based on SCr of 0.59 mg/dL (L)). Liver Function Tests: Recent Labs  Lab 05/30/18 0440 05/31/18 0419 06/01/18 0615 06/02/18 0518 06/03/18 0500  AST 246* 222* 243* 245* 159*  ALT 257* 246* 234* 248* 188*  ALKPHOS 388* 454* 496* 585* 467*  BILITOT 8.8* 9.7* 9.5* 11.1* 9.1*  PROT 6.6 6.9 6.4* 7.6 6.7  ALBUMIN 3.0* 2.9* 2.6* 3.2* 2.7*   Recent Labs  Lab 05/27/18 1435  LIPASE 32   No results for input(s): AMMONIA in the last 168 hours. Coagulation Profile: Recent Labs  Lab 05/31/18 0419  INR 1.04   Cardiac Enzymes: No results for input(s): CKTOTAL, CKMB, CKMBINDEX, TROPONINI in the last 168 hours. BNP (last 3 results) No results for input(s): PROBNP in the last 8760 hours. HbA1C: No results for input(s): HGBA1C in the last 72 hours. CBG: No results for input(s): GLUCAP in the last 168 hours. Lipid Profile: No results for input(s): CHOL, HDL, LDLCALC, TRIG, CHOLHDL, LDLDIRECT in the last 72 hours. Thyroid Function Tests: No results for input(s): TSH, T4TOTAL, FREET4, T3FREE, THYROIDAB in the last 72 hours. Anemia Panel: No results for input(s): VITAMINB12, FOLATE, FERRITIN, TIBC, IRON, RETICCTPCT in the last 72 hours. Sepsis Labs: No results for input(s): PROCALCITON, LATICACIDVEN in the last 168 hours.  No results found for this or any previous visit (from the past 240 hour(s)).       Radiology Studies: Ct Abdomen W Contrast  Result Date: 06/02/2018 CLINICAL DATA:  Concern for metastatic either pancreatic or gastric cancer with concern for obstructive malignant jaundiced post failed attempted ERCP subsequently undergoing ultrasound fluoroscopic guided placement of a percutaneous biliary drainage catheter on 05/26/2018. Unfortunately, the patient's LFTs continue to arise with cholangiogram demonstrating wide patency of the biliary drainage catheter but lack of substantial opacification of the intrahepatic biliary tree. Please evaluate for residual intrahepatic  biliary ductal dilatation. EXAM: CT ABDOMEN WITH CONTRAST TECHNIQUE: Multidetector CT imaging of the abdomen was performed using the standard protocol following bolus administration of intravenous contrast. CONTRAST:  163mL ISOVUE-300 IOPAMIDOL (ISOVUE-300) INJECTION 61% COMPARISON:  CT chest, abdomen and pelvis - 05/27/2018; ultrasound fluoroscopic guided percutaneous biliary drainage catheter placement-05/26/2018 FINDINGS: Lower chest: Limited visualization of the lower thorax demonstrates development of trace bilateral pleural effusions, left greater than right. Minimal subsegmental atelectasis within the bilateral lower lobes. Normal heart size. Trace amount of pericardial fluid, presumably physiologic. Hepatobiliary: Interval placement of a right anterior biliary percutaneous drainage catheter with end coiled within the duodenum and radiopaque marker at the level the biliary hilum. Despite appropriate positioning of the percutaneous biliary drainage catheter, there has been no substantial change to potential slight worsening of previously noted mild intrahepatic biliary ductal dilatation. Normal hepatic contour. Loculated subcapsular fluid collection about the posterior aspect the right lobe of the liver has minimally increased in size in the interval, currently measuring 5.5 x 6.6 x 7.8 cm as measured in greatest oblique short axis axial (image 26, series 2) and coronal (image 80, series 5) dimensions respectively, previously, 5.7 x 3.4 x  4.6 cm. Geographic area of hypoattenuation about the subcapsular aspect of the caudal tip of the right lobe of the liver likely represents the sequela of recent percutaneous biliary drainage catheter placement and or streak artifact from the adjacent rib (image 33, series 2). No definitive discrete intrahepatic lesions. Portal vein appears patent. Pancreas: The distal end of the pancreatic tail is again inseparable from ill-defined peripherally enhancing loculated fluid  within the left upper abdominal quadrant (representative axial image 19, series 2, coronal image 73, series 5), favored to represent secondary involvement as the remainder of the pancreas appears normal. Spleen: Absent. Adrenals/Urinary Tract: There is symmetric enhancement and excretion of the bilateral kidneys. No definite renal stones this postcontrast examination. No discrete renal lesions. No urine obstruction or perinephric stranding. Normal appearance the bilateral adrenal glands. The urinary bladder was not imaged. Stomach/Bowel: Read demonstrated multiple apparently loculated fluid collections about the esophagus, stomach and porta hepatis. Dominant fluid collection about the distal aspect of the esophagus at the level of the diaphragmatic hiatus measures approximately 4.1 x 2.9 cm (axial image 13, series 2), dominant bilobed fluid collection about the greater curvature of the stomach measures approximately 7.0 x 3.2 cm (image 24, series 2) and dominant collection adjacent to the anterior aspect of the IVC measures approximately 2.6 x 2.4 cm (image 26, series 2), all of which appear grossly unchanged compared to the 05/2018 examination. Vascular/Lymphatic: Normal caliber of the abdominal aorta. The major branch vessels of the abdominal aorta appear patent. Evaluation for abdominal adenopathy is degraded secondary to the complex irregular fluid collections within the imaged upper abdomen. Read demonstrated findings worrisome for mental caking about the ventral aspect of the mid abdomen (axial image 47 and 56, series 2). Other: Minimal amount of right lateral body wall subcutaneous edema adjacent to the percutaneous biliary drainage catheter. Musculoskeletal: No definite acute or aggressive osseous abnormalities. Re-demonstrated bilateral old/healed rib fractures. IMPRESSION: 1. Appropriately positioned percutaneous biliary drainage catheter with no change to slight worsening of intrahepatic biliary ductal  dilatation. 2. Slight increase in size of subcapsular fluid collection about the posterior aspect of the right lobe of the liver, currently measuring 7.8 cm, previously, 5.6 cm. Otherwise, grossly unchanged findings of extensive metastatic disease within the imaged upper abdomen without significant interval change. PLAN: - above discussed with Dr. Earleen Newport (IR MD at [facility] today). - given above findings, patient will undergo fluoroscopic guided cholangiogram and percutaneous biliary drainage catheter exchange and potential up sizing. Electronically Signed   By: Sandi Mariscal M.D.   On: 06/02/2018 09:12   Ir Cholangiogram Existing Tube  Result Date: 06/01/2018 INDICATION: Concern for metastatic gastric cancer with presumed malignant biliary obstruction post failed attempted ERCP and as such underwent ultrasound and fluoroscopic guided placement of a right hepatic approach biliary drainage catheter on 05/26/2018. Unfortunately, the patient continues to suffer from persistently elevated LFTs as such request made for cholangiogram via the internal/external drainage catheter evaluate for drainage catheter functionality. EXAM: CHOLANGIOGRAM VIA EXISTING INTERNAL/EXTERNAL PERCUTANEOUS BILIARY DRAINAGE CATHETER COMPARISON:  COMPARISON Ultrasound fluoroscopic guided percutaneous biliary drainage catheter placement - 05/26/2018; CT abdomen and pelvis - 05/19/2018 CONTRAST:  20 cc Isovue-300 administered via the biliary drainage catheter FLUOROSCOPY TIME:  1 minute 12 seconds (8.5 mGy) COMPLICATIONS: None immediate. TECHNIQUE: Informed written consent was obtained from the patient after a discussion of the risks, benefits and alternatives to treatment. Questions regarding the procedure were encouraged and answered. A timeout was performed prior to the initiation of the procedure. Patient was positioned  supine on the fluoroscopy table. Preprocedural spot fluoroscopic image was obtained of the right upper abdominal quadrant  existing percutaneous drainage catheter. Multiple spot fluoroscopic and radiographic images were obtained in various obliquities following the injection of a small amount contrast. Images reviewed in the procedure was terminated. The percutaneous biliary drainage catheter was flushed and reconnected to a gravity bag. A dressing was placed. The patient tolerated the procedure well without immediate postprocedural complication. FINDINGS: The existing percutaneous biliary catheter is appropriately positioned and functioning. Contrast injection demonstrates brisk passage of contrast through the biliary drainage catheter to the level of the duodenum. There is no definitive opacification of the intrahepatic biliary tree despite vigorous contrast injection. IMPRESSION: Appropriately positioned and functioning right hepatic approach percutaneous biliary drainage catheter. PLAN: My assumption is the lack of opacification of the intrahepatic biliary tree is secondary to the lack of any significant intrahepatic biliary ductal dilatation as was suggested both on the preceding abdominal CT performed 05/19/2018 as well as the ultrasound and fluoroscopic guided biliary drainage catheter placement performed 05/26/2018. To confirm this assumption, I will obtain a IV only CT scan of the abdomen. If there is no significant intrahepatic biliary duct dilatation, the etiology of the patient's elevated LFTs is likely secondary to a non obstructive etiology. If intrahepatic biliary duct dilatation is seen on the abdominal CT, would pursue fluoroscopic guided biliary drainage catheter exchange and potential up sizing. Electronically Signed   By: Sandi Mariscal M.D.   On: 06/01/2018 17:33        Scheduled Meds: . fentaNYL  25 mcg Transdermal Q72H  . fluconazole  200 mg Oral Daily  . FLUOROURACIL (ADRUCIL) CHEMO infusion For Inpatient Use  1,200 mg/m2 (Treatment Plan Recorded) Intravenous Once  . iopamidol      . lidocaine      .  loratadine  10 mg Oral Daily  . multivitamin-iron-minerals-folic acid  1 tablet Oral Daily  . naloxegol oxalate  12.5 mg Oral Daily  . pantoprazole  40 mg Oral BID  . polyethylene glycol  17 g Oral TID  . senna-docusate  1 tablet Oral BID  . sodium chloride flush  5 mL Intracatheter Q8H  . sucralfate  1 g Oral TID WC & HS  . vitamin B-12  1,000 mcg Oral Daily   Continuous Infusions: . piperacillin-tazobactam (ZOSYN)  IV       LOS: 7 days     Georgette Shell, MD [facility]  If 7PM-7AM, please contact night-coverage [email or web address] Password [facility] 06/03/2018, 10:48 AM

## 2018-06-04 DIAGNOSIS — K838 Other specified diseases of biliary tract: Secondary | ICD-10-CM

## 2018-06-04 LAB — COMPREHENSIVE METABOLIC PANEL
ALT: 227 U/L — ABNORMAL HIGH (ref 17–63)
AST: 245 U/L — ABNORMAL HIGH (ref 15–41)
Albumin: 3 g/dL — ABNORMAL LOW (ref 3.5–5.0)
Alkaline Phosphatase: 465 U/L — ABNORMAL HIGH (ref 38–126)
Anion gap: 13 (ref 5–15)
BUN: 11 mg/dL (ref 6–20)
CO2: 28 mmol/L (ref 22–32)
Calcium: 9 mg/dL (ref 8.9–10.3)
Chloride: 97 mmol/L — ABNORMAL LOW (ref 101–111)
Creatinine, Ser: 0.76 mg/dL (ref 0.61–1.24)
Glucose, Bld: 129 mg/dL — ABNORMAL HIGH (ref 65–99)
POTASSIUM: 3.7 mmol/L (ref 3.5–5.1)
Sodium: 138 mmol/L (ref 135–145)
Total Bilirubin: 7.9 mg/dL — ABNORMAL HIGH (ref 0.3–1.2)
Total Protein: 7.6 g/dL (ref 6.5–8.1)

## 2018-06-04 LAB — CBC WITH DIFFERENTIAL/PLATELET
BASOS ABS: 0 10*3/uL (ref 0.0–0.1)
Basophils Relative: 0 %
EOS ABS: 0.2 10*3/uL (ref 0.0–0.7)
EOS PCT: 2 %
HCT: 34.2 % — ABNORMAL LOW (ref 39.0–52.0)
Hemoglobin: 11.4 g/dL — ABNORMAL LOW (ref 13.0–17.0)
Lymphocytes Relative: 8 %
Lymphs Abs: 0.9 10*3/uL (ref 0.7–4.0)
MCH: 30.7 pg (ref 26.0–34.0)
MCHC: 33.3 g/dL (ref 30.0–36.0)
MCV: 92.2 fL (ref 78.0–100.0)
MONO ABS: 0.5 10*3/uL (ref 0.1–1.0)
Monocytes Relative: 5 %
Neutro Abs: 8.9 10*3/uL — ABNORMAL HIGH (ref 1.7–7.7)
Neutrophils Relative %: 85 %
Platelets: 541 10*3/uL — ABNORMAL HIGH (ref 150–400)
RBC: 3.71 MIL/uL — ABNORMAL LOW (ref 4.22–5.81)
RDW: 15 % (ref 11.5–15.5)
WBC: 10.4 10*3/uL (ref 4.0–10.5)

## 2018-06-04 MED ORDER — FENTANYL 25 MCG/HR TD PT72: 25.0000 ug | MEDICATED_PATCH | TRANSDERMAL | 0 refills | Status: DC

## 2018-06-04 MED ORDER — FLUCONAZOLE 200 MG PO TABS: 200.0000 mg | ORAL_TABLET | Freq: Every day | ORAL | 0 refills | Status: DC

## 2018-06-04 MED ORDER — PROCHLORPERAZINE EDISYLATE 10 MG/2ML IJ SOLN: 10.0000 mg | Freq: Four times a day (QID) | INTRAMUSCULAR | 0 refills | Status: DC | PRN

## 2018-06-04 MED ORDER — PROCHLORPERAZINE MALEATE 10 MG PO TABS: 10.0000 mg | ORAL_TABLET | Freq: Four times a day (QID) | ORAL | 0 refills | Status: DC | PRN

## 2018-06-04 NOTE — Progress Notes (Signed)
Completed discharge education with pt regarding medications, discharge instructions, and drain care. Reviewed drain care in detail with pt. No further questions at this time.

## 2018-06-04 NOTE — Discharge Summary (Signed)
Physician Discharge Summary  [name] XNT:[id number] DOB: Nov 16, 1961 DOA: 05/27/2018  PCP: Howard Pouch A, DO  Admit date: 05/27/2018 Discharge date: 06/04/2018  Admitted From: Home  Disposition: Home Recommendations for Outpatient Follow-up:  1. Follow up with PCP in 1-2 weeks 2. Please obtain CMP/CBC in one week 3. Follow-up with Dr.Ennever next week for blood work  Home Health: Yes Equipment/Devices: None Discharge Condition stable CODE STATUS: Full code Diet recommendation: Regular diet Brief/Interim Summary: 57 year old male with history of squamous cell carcinoma, GERD, and chronic constipation who was admitted for abnormal abdominal CT scanhe had as a result of abdominal discomfort. His CT revealed a mass around the pancreas and stomach. An EGD was donewith biopsy, pathology reports the mass is gastric in origin.He also had a transaminitis on admission so apercutaneous biliary drain was placed by IR in hope to decrease LFTs. 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 CT evidence of infiltrative mass Spreadingfrom stomach and the pancreas. Port-A-Cath placed 06/01/2018. Patient started dosage reduced FOLFOX regimen 06/02/2018.  Patient tolerated chemo without significant side effects.  He is very eager to be discharged home later today.  2]elevated liver function test bilirubin alkaline phosphatase AST ALT trending down.  IR  upsized his drainage tube yesterday.  Liver function  test improving.  3]constipation resolved stool softeners stopped due to diarrhea.     Discharge Instructions  Discharge Instructions    Call MD for:  difficulty breathing, headache or visual disturbances   Complete by:  As directed    Call MD for:  extreme fatigue   Complete by:  As directed    Call MD for:  hives   Complete by:  As directed    Call MD for:  persistant dizziness or light-headedness   Complete by:  As directed    Call MD for:  persistant nausea and vomiting   Complete by:  As directed    Call MD for:  redness, tenderness, or signs of infection (pain, swelling, redness, odor or green/yellow discharge around incision site)   Complete by:  As directed    Call MD for:  severe uncontrolled pain   Complete by:  As directed    Call MD for:  temperature >100.4   Complete by:  As directed    Diet - low sodium heart healthy   Complete by:  As directed    Face-to-face encounter (required for Medicare/Medicaid patients)   Complete by:  As directed    I Georgette Shell certify that this patient is under my care and that I, or a nurse practitioner or physician's assistant working with me, had a face-to-face encounter that meets the physician face-to-face encounter requirements with this patient on 06/04/2018. The encounter with the patient was in whole, or in part for the following medical condition(s) which is the primary reason for home health care (List medical condition): gastric cancer   The encounter with the patient was in whole, or in part, for the following medical condition, which is the primary reason for home  health care:  gastric cancer new diagnosis   I certify that, based on my findings, the following services are medically necessary home health services:  Nursing   Reason for Medically Necessary Home Health Services:  Skilled Nursing- Assessment and Training for Infusion Therapy, Line Care, and Infection Control   My clinical findings support the need for the  above services:  Pain interferes with ambulation/mobility   Further, I certify that my clinical findings support that this patient is homebound due to:  Pain interferes with ambulation/mobility   Home Health   Complete by:  As directed    To provide the following care/treatments:  RN   Increase activity slowly   Complete by:  As directed    TREATMENT CONDITIONS   Complete by:  As directed    Patient should have CBC & CMP within 7 days prior to chemotherapy administration. NOTIFY MD IF: ANC < 1500, Hemoglobin < 8, PLT < 100,000,  Total Bili > 1.5, Creatinine > 1.5, ALT & AST > 80 or if patient has unstable vital signs: Temperature > 38.5, SBP > 180 or < 90, RR > 30 or HR > 100.     Allergies as of 06/04/2018      Reactions   Cephalexin Other (See Comments)   "Sore throat" REACTION: SORE THROAT   Other    Bee sting   Latex Rash   Tape Rash      Medication List    STOP taking these medications   naproxen sodium 220 MG tablet Commonly known as:  ALEVE   ondansetron 4 MG disintegrating tablet Commonly known as:  ZOFRAN ODT     TAKE these medications   cetirizine 10 MG tablet Commonly known as:  ZYRTEC Take 10 mg by mouth daily.   esomeprazole 20 MG capsule Commonly known as:  NEXIUM TAKE ONE CAPSULE BY MOUTH EVERY DAY   fentaNYL 25 MCG/HR patch Commonly known as:  Strum - dosed mcg/hr Place 1 patch (25 mcg total) onto the skin every 3 (three) days. Start taking on:  06/06/2018   flintstones complete 60 MG chewable tablet Chew 1 tablet by mouth daily.   fluconazole 200 MG tablet Commonly known as:  DIFLUCAN Take 1 tablet (200 mg total) by mouth daily.   halobetasol 0.05 % ointment Commonly known as:  ULTRAVATE APPLY TO AFFECTED AREA TWICE A DAY   hydrocortisone cream 1 % Apply 1 application topically daily as needed for itching.   oxyCODONE 5 MG immediate release tablet Commonly known as:  Oxy IR/ROXICODONE Take 1 tablet (5 mg total) by mouth every 6 (six)  hours as needed for severe pain.   polyethylene glycol packet Commonly known as:  MIRALAX / GLYCOLAX Take 17 g by mouth daily.   prochlorperazine 10 MG/2ML injection Commonly known as:  COMPAZINE Inject 2 mLs (10 mg total) into the vein every 6 (six) hours as needed.   sildenafil 20 MG tablet Commonly known as:  REVATIO 1-5 tabs PO prior to sexually activity.   VITAMIN B 12 PO Take 1,000 mcg by mouth daily.      Follow-up Information    [facility]   Specialty:  [place] Why:  for home health nurse Contact information: [place] [address] [phone]          Allergies  Allergen Reactions  . Cephalexin Other (See Comments)    "Sore throat" REACTION: SORE THROAT  . Other  Bee sting  . Latex Rash  . Tape Rash    Consultations:  Oncology Dr. Marin Olp   Procedures/Studies: Ct Chest W Contrast  Result Date: 05/28/2018 CLINICAL DATA:  57 year old male with several weeks of abdominal discomfort. Mass involving the pancreas and stomach. Followup study. EXAM: CT CHEST, ABDOMEN, AND PELVIS WITH CONTRAST TECHNIQUE: Multidetector CT imaging of the chest, abdomen and pelvis was performed following the standard protocol during bolus administration of intravenous contrast. CONTRAST:  147m ISOVUE-300 IOPAMIDOL (ISOVUE-300) INJECTION 61%, 368mISOVUE-300 IOPAMIDOL (ISOVUE-300) INJECTION 61% COMPARISON:  CT the abdomen and pelvis 05/19/2018. FINDINGS: CT CHEST FINDINGS Cardiovascular: Heart size is normal. There is no significant pericardial fluid, thickening or pericardial calcification. Mild atherosclerosis in the great vessels. No coronary artery calcifications. Mediastinum/Nodes: Some of the loculated fluid components or cystic neoplasm from the upper abdomen (discussed below) extends cephalad via the esophageal hiatus into the lower middle mediastinum to the right of the distal esophagus. No pathologically  enlarged mediastinal or hilar lymph nodes are noted. Esophagus is unremarkable in appearance. No axillary lymphadenopathy. Lungs/Pleura: Small left-sided Bochdalek's hernia incidentally noted. No suspicious appearing pulmonary nodules or masses are noted. No acute consolidative airspace disease. A few areas of mild linear scarring are noted. No pleural effusions. Musculoskeletal: Multiple old healed bilateral rib fractures with significant posttraumatic deformity of the thoracic cage. There are no aggressive appearing lytic or blastic lesions noted in the visualized portions of the skeleton. CT ABDOMEN PELVIS FINDINGS Hepatobiliary: In segment 2 of the liver there is a well-defined 1.2 cm low to intermediate attenuation lesion which is incompletely characterized. No other intrahepatic lesion is confidently identified on today's examination. There is extensive scalloping of the surface of the liver which appears to be related to multiple serosal implants, the largest of which is overlying the right lobe of the liver where a low-attenuation implant or loculated fluid collection measures approximately 4.6 x 6.8 cm (axial image 51 of series 2). Mild intrahepatic biliary ductal dilatation. Common bile duct is poorly demonstrated. Status post cholecystectomy. Pancreas: In the tail of the pancreas there is a poorly defined mass which extends laterally and cephalad coming in contact with the adjacent left hemidiaphragm (which it appears to invade), as well as the lateral left upper abdominal wall and the lateral surface of the cardia and proximal body of the stomach. This mass measures approximately 5.5 x 6.4 x 5.2 cm (axial image 44 of series 2 and coronal image 98 of series 8). The remainder of the body and head of the pancreas are otherwise generally unremarkable in appearance. Spleen: Status post splenectomy. Adrenals/Urinary Tract: Bilateral kidneys and bilateral adrenal glands are normal in appearance. No  hydroureteronephrosis. Urinary bladder is normal in appearance. Stomach/Bowel: There is a poorly defined infiltrative mass-like area in the proximal aspect of the stomach along the greater curvature which is estimated to measure approximately 11.5 x 4.1 x 6.0 cm (axial image 43 of series 2 and coronal image 77 of series 8). Several other more cystic appearing areas are also noted associated with the gastric wall both along the greater curvature, the inferior surface of the stomach, and along the lesser curvature in the antral pre-pyloric region. No pathologic dilatation of small bowel or colon. Appendix is not confidently identified. Vascular/Lymphatic: Aortic atherosclerosis, without evidence of aneurysm or dissection in the abdominal or pelvic vasculature. Several prominent borderline enlarged and mildly enlarged retroperitoneal lymph nodes are noted measuring up to 1 cm in short axis in the left para-aortic nodal station (axial  image 66 of series 2). In the upper abdomen in the expected location of portacaval, hepatic duodenal and celiac axis nodal stations there are large low-attenuation areas which have a cystic appearance, but could reflect necrotic lymphadenopathy. Some of this extends into the lower middle mediastinum (discussed above). While lymphadenopathy is not excluded, at this time these are favored to reflect malignant mucinous/cystic metastatic deposits Reproductive: Prostate gland and seminal vesicles are unremarkable in appearance. Other: Trace volume of ascites. Multifocal peritoneal nodularity noted, concerning for widespread intraperitoneal metastatic disease. Musculoskeletal: There are no aggressive appearing lytic or blastic lesions noted in the visualized portions of the skeleton. IMPRESSION: 1. Widespread intraperitoneal metastasis, as detailed above. The origin of this neoplasm is uncertain, and could either be from a large infiltrative mass in the tail of the pancreas, which has  subsequently invaded the proximal stomach, or a large gastric neoplasm which extended into a splenectomy bed now involving the tail of the pancreas. Tissue sampling is recommended to establish a definitive diagnosis. 2. No definite evidence of intrapulmonary metastasis. 3. There does appear to be direct spread of disease through the esophageal hiatus into the lower middle mediastinum. 4. Additional incidental findings, as above. Electronically Signed   By: Vinnie Langton M.D.   On: 05/28/2018 07:25   Ct Abdomen W Contrast  Result Date: 06/02/2018 CLINICAL DATA:  Concern for metastatic either pancreatic or gastric cancer with concern for obstructive malignant jaundiced post failed attempted ERCP subsequently undergoing ultrasound fluoroscopic guided placement of a percutaneous biliary drainage catheter on 05/26/2018. Unfortunately, the patient's LFTs continue to arise with cholangiogram demonstrating wide patency of the biliary drainage catheter but lack of substantial opacification of the intrahepatic biliary tree. Please evaluate for residual intrahepatic biliary ductal dilatation. EXAM: CT ABDOMEN WITH CONTRAST TECHNIQUE: Multidetector CT imaging of the abdomen was performed using the standard protocol following bolus administration of intravenous contrast. CONTRAST:  181m ISOVUE-300 IOPAMIDOL (ISOVUE-300) INJECTION 61% COMPARISON:  CT chest, abdomen and pelvis - 05/27/2018; ultrasound fluoroscopic guided percutaneous biliary drainage catheter placement-05/26/2018 FINDINGS: Lower chest: Limited visualization of the lower thorax demonstrates development of trace bilateral pleural effusions, left greater than right. Minimal subsegmental atelectasis within the bilateral lower lobes. Normal heart size. Trace amount of pericardial fluid, presumably physiologic. Hepatobiliary: Interval placement of a right anterior biliary percutaneous drainage catheter with end coiled within the duodenum and radiopaque marker at  the level the biliary hilum. Despite appropriate positioning of the percutaneous biliary drainage catheter, there has been no substantial change to potential slight worsening of previously noted mild intrahepatic biliary ductal dilatation. Normal hepatic contour. Loculated subcapsular fluid collection about the posterior aspect the right lobe of the liver has minimally increased in size in the interval, currently measuring 5.5 x 6.6 x 7.8 cm as measured in greatest oblique short axis axial (image 26, series 2) and coronal (image 80, series 5) dimensions respectively, previously, 5.7 x 3.4 x 4.6 cm. Geographic area of hypoattenuation about the subcapsular aspect of the caudal tip of the right lobe of the liver likely represents the sequela of recent percutaneous biliary drainage catheter placement and or streak artifact from the adjacent rib (image 33, series 2). No definitive discrete intrahepatic lesions. Portal vein appears patent. Pancreas: The distal end of the pancreatic tail is again inseparable from ill-defined peripherally enhancing loculated fluid within the left upper abdominal quadrant (representative axial image 19, series 2, coronal image 73, series 5), favored to represent secondary involvement as the remainder of the pancreas appears normal. Spleen: Absent.  Adrenals/Urinary Tract: There is symmetric enhancement and excretion of the bilateral kidneys. No definite renal stones this postcontrast examination. No discrete renal lesions. No urine obstruction or perinephric stranding. Normal appearance the bilateral adrenal glands. The urinary bladder was not imaged. Stomach/Bowel: Read demonstrated multiple apparently loculated fluid collections about the esophagus, stomach and porta hepatis. Dominant fluid collection about the distal aspect of the esophagus at the level of the diaphragmatic hiatus measures approximately 4.1 x 2.9 cm (axial image 13, series 2), dominant bilobed fluid collection about the  greater curvature of the stomach measures approximately 7.0 x 3.2 cm (image 24, series 2) and dominant collection adjacent to the anterior aspect of the IVC measures approximately 2.6 x 2.4 cm (image 26, series 2), all of which appear grossly unchanged compared to the 05/2018 examination. Vascular/Lymphatic: Normal caliber of the abdominal aorta. The major branch vessels of the abdominal aorta appear patent. Evaluation for abdominal adenopathy is degraded secondary to the complex irregular fluid collections within the imaged upper abdomen. Read demonstrated findings worrisome for mental caking about the ventral aspect of the mid abdomen (axial image 47 and 56, series 2). Other: Minimal amount of right lateral body wall subcutaneous edema adjacent to the percutaneous biliary drainage catheter. Musculoskeletal: No definite acute or aggressive osseous abnormalities. Re-demonstrated bilateral old/healed rib fractures. IMPRESSION: 1. Appropriately positioned percutaneous biliary drainage catheter with no change to slight worsening of intrahepatic biliary ductal dilatation. 2. Slight increase in size of subcapsular fluid collection about the posterior aspect of the right lobe of the liver, currently measuring 7.8 cm, previously, 5.6 cm. Otherwise, grossly unchanged findings of extensive metastatic disease within the imaged upper abdomen without significant interval change. PLAN: - above discussed with Dr. Earleen Newport (IR MD at [facility] today). - given above findings, patient will undergo fluoroscopic guided cholangiogram and percutaneous biliary drainage catheter exchange and potential up sizing. Electronically Signed   By: Sandi Mariscal M.D.   On: 06/02/2018 09:12   Ct Abdomen Pelvis W Contrast  Result Date: 05/28/2018 CLINICAL DATA:  57 year old male with several weeks of abdominal discomfort. Mass involving the pancreas and stomach. Followup study. EXAM: CT CHEST, ABDOMEN, AND PELVIS WITH CONTRAST TECHNIQUE: Multidetector CT  imaging of the chest, abdomen and pelvis was performed following the standard protocol during bolus administration of intravenous contrast. CONTRAST:  132m ISOVUE-300 IOPAMIDOL (ISOVUE-300) INJECTION 61%, 354mISOVUE-300 IOPAMIDOL (ISOVUE-300) INJECTION 61% COMPARISON:  CT the abdomen and pelvis 05/19/2018. FINDINGS: CT CHEST FINDINGS Cardiovascular: Heart size is normal. There is no significant pericardial fluid, thickening or pericardial calcification. Mild atherosclerosis in the great vessels. No coronary artery calcifications. Mediastinum/Nodes: Some of the loculated fluid components or cystic neoplasm from the upper abdomen (discussed below) extends cephalad via the esophageal hiatus into the lower middle mediastinum to the right of the distal esophagus. No pathologically enlarged mediastinal or hilar lymph nodes are noted. Esophagus is unremarkable in appearance. No axillary lymphadenopathy. Lungs/Pleura: Small left-sided Bochdalek's hernia incidentally noted. No suspicious appearing pulmonary nodules or masses are noted. No acute consolidative airspace disease. A few areas of mild linear scarring are noted. No pleural effusions. Musculoskeletal: Multiple old healed bilateral rib fractures with significant posttraumatic deformity of the thoracic cage. There are no aggressive appearing lytic or blastic lesions noted in the visualized portions of the skeleton. CT ABDOMEN PELVIS FINDINGS Hepatobiliary: In segment 2 of the liver there is a well-defined 1.2 cm low to intermediate attenuation lesion which is incompletely characterized. No other intrahepatic lesion is confidently identified on today's examination. There  is extensive scalloping of the surface of the liver which appears to be related to multiple serosal implants, the largest of which is overlying the right lobe of the liver where a low-attenuation implant or loculated fluid collection measures approximately 4.6 x 6.8 cm (axial image 51 of series 2).  Mild intrahepatic biliary ductal dilatation. Common bile duct is poorly demonstrated. Status post cholecystectomy. Pancreas: In the tail of the pancreas there is a poorly defined mass which extends laterally and cephalad coming in contact with the adjacent left hemidiaphragm (which it appears to invade), as well as the lateral left upper abdominal wall and the lateral surface of the cardia and proximal body of the stomach. This mass measures approximately 5.5 x 6.4 x 5.2 cm (axial image 44 of series 2 and coronal image 98 of series 8). The remainder of the body and head of the pancreas are otherwise generally unremarkable in appearance. Spleen: Status post splenectomy. Adrenals/Urinary Tract: Bilateral kidneys and bilateral adrenal glands are normal in appearance. No hydroureteronephrosis. Urinary bladder is normal in appearance. Stomach/Bowel: There is a poorly defined infiltrative mass-like area in the proximal aspect of the stomach along the greater curvature which is estimated to measure approximately 11.5 x 4.1 x 6.0 cm (axial image 43 of series 2 and coronal image 77 of series 8). Several other more cystic appearing areas are also noted associated with the gastric wall both along the greater curvature, the inferior surface of the stomach, and along the lesser curvature in the antral pre-pyloric region. No pathologic dilatation of small bowel or colon. Appendix is not confidently identified. Vascular/Lymphatic: Aortic atherosclerosis, without evidence of aneurysm or dissection in the abdominal or pelvic vasculature. Several prominent borderline enlarged and mildly enlarged retroperitoneal lymph nodes are noted measuring up to 1 cm in short axis in the left para-aortic nodal station (axial image 66 of series 2). In the upper abdomen in the expected location of portacaval, hepatic duodenal and celiac axis nodal stations there are large low-attenuation areas which have a cystic appearance, but could reflect  necrotic lymphadenopathy. Some of this extends into the lower middle mediastinum (discussed above). While lymphadenopathy is not excluded, at this time these are favored to reflect malignant mucinous/cystic metastatic deposits Reproductive: Prostate gland and seminal vesicles are unremarkable in appearance. Other: Trace volume of ascites. Multifocal peritoneal nodularity noted, concerning for widespread intraperitoneal metastatic disease. Musculoskeletal: There are no aggressive appearing lytic or blastic lesions noted in the visualized portions of the skeleton. IMPRESSION: 1. Widespread intraperitoneal metastasis, as detailed above. The origin of this neoplasm is uncertain, and could either be from a large infiltrative mass in the tail of the pancreas, which has subsequently invaded the proximal stomach, or a large gastric neoplasm which extended into a splenectomy bed now involving the tail of the pancreas. Tissue sampling is recommended to establish a definitive diagnosis. 2. No definite evidence of intrapulmonary metastasis. 3. There does appear to be direct spread of disease through the esophageal hiatus into the lower middle mediastinum. 4. Additional incidental findings, as above. Electronically Signed   By: Vinnie Langton M.D.   On: 05/28/2018 07:25   Ct Abdomen Pelvis W Contrast  Result Date: 05/19/2018 CLINICAL DATA:  Mid and lower abdominal pain for several months. Nausea and vomiting with hematemesis beginning last night. Personal history of melanoma and cutaneous squamous cell carcinoma. EXAM: CT ABDOMEN AND PELVIS WITH CONTRAST TECHNIQUE: Multidetector CT imaging of the abdomen and pelvis was performed using the standard protocol following bolus administration of  intravenous contrast. CONTRAST:  1103m ISOVUE-300 IOPAMIDOL (ISOVUE-300) INJECTION 61% COMPARISON:  None. FINDINGS: Lower Chest: No acute findings. Old bilateral rib fracture deformities noted. Hepatobiliary: No hepatic masses identified.  Small cysts seen in the posterior left hepatic lobe. Prior cholecystectomy. No evidence of biliary obstruction. Pancreas: Ill-defined low-attenuation mass is seen in the pancreatic tail measuring 3.3 x 2.0 cm on image 24/2. Pancreatic ductal dilatation is seen in the distal pancreatic tail upstream from this mass. Spleen: Prior splenectomy. Ill-defined soft tissue mass in the splenectomy bed which abuts the lateral wall the stomach and pancreatic tail. This measures 5.8 x 4.2 cm on image 19/2. Adrenals/Urinary Tract: No masses identified. No evidence of hydronephrosis. Stomach/Bowel: Bulky masslike soft tissue density is seen along the lateral wall of the gastric body and fundus. A fluid density component is seen along the inferolateral wall of the gastric body which measures 6.3 x 3.2 cm on image 10/7. Ill-defined adjacent soft tissue density is also seen in the left upper quadrant, as described in the spleen section above. Proximal small bowel loops are distended by oral contrast, but no transition point is seen suggesting this is due to bolus ingestion of contrast. Vascular/Lymphatic: Mild lymphadenopathy is seen in the left paraaortic region, largest measuring 1.5 cm on image 30/2. No other pathologically enlarged lymph nodes identified. No significant vascular abnormality . Reproductive:  No mass or other significant abnormality. Other: Mild ascites is seen. Multilocular fluid collections are seen in the upper abdomen in the gastrohepatic ligament, porta hepatis, and portacaval spaces. Mild peritoneal thickening and enhancement is seen in the right paracolic gutter and dependent portion of the pelvis. Mild soft tissue stranding and nodularity is also seen in the region of the greater omentum in the left abdomen. 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 Musculoskeletal: No suspicious bone lesions identified. IMPRESSION: 3.3 cm masslike soft tissue density in the pancreatic tail with proximal  pancreatic ductal dilatation. This could be due to primary pancreatic carcinoma or metastatic disease involving the pancreas. Bulky masslike soft tissue density along the lateral wall the stomach, and in the adjacent gastrosplenic ligament. This is suspicious for metastatic disease, although primary gastric carcinoma cannot be excluded. Consider endoscopy and [place]. Mild ascites with multiloculated fluid collections in the upper abdomen. Peritoneal and omental soft tissue thickening and nodularity, highly suspicious for peritoneal carcinomatosis. Electronically Signed   By: JEarle GellM.D.   On: 05/19/2018 11:29   [place]domen Limited  Result Date: 05/29/2018 CLINICAL DATA:  57year old male with a history of questionable ascites. EXAM: LIMITED ABDOMEN ULTRASOUND FOR ASCITES TECHNIQUE: Limited ultrasound survey for ascites was performed in all four abdominal quadrants. COMPARISON:  None. FINDINGS: Limited ultrasound survey of the abdomen demonstrates no significant ascites. IMPRESSION: Sonographic survey demonstrates no significant ascites. Electronically Signed   By: JCorrie MckusickD.O.   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 Unfortunately, the patient continues to suffer from persistently elevated LFTs as such request made for cholangiogram via the internal/external drainage catheter evaluate for drainage catheter functionality. EXAM: CHOLANGIOGRAM VIA EXISTING INTERNAL/EXTERNAL PERCUTANEOUS BILIARY DRAINAGE CATHETER COMPARISON:  COMPARISON Ultrasound fluoroscopic guided percutaneous biliary drainage catheter placement - 05/26/2018; CT abdomen and pelvis - 05/19/2018 CONTRAST:  20 cc Isovue-300  administered via the  biliary drainage catheter FLUOROSCOPY TIME:  1 minute 12 seconds (8.5 mGy) COMPLICATIONS: None immediate. TECHNIQUE: Informed written consent was obtained from the patient after a discussion of the risks, benefits and alternatives to treatment. Questions regarding the procedure were encouraged and answered. A timeout was performed prior to the initiation of the procedure. Patient was positioned supine on the fluoroscopy table. Preprocedural spot fluoroscopic image was obtained of the right upper abdominal quadrant existing percutaneous drainage catheter. Multiple spot fluoroscopic and radiographic images were obtained in various obliquities following the injection of a small amount contrast. Images reviewed in the procedure was terminated. The percutaneous biliary drainage catheter was flushed and reconnected to a gravity bag. A dressing was placed. The patient tolerated the procedure well without immediate postprocedural complication. FINDINGS: The existing percutaneous biliary catheter is appropriately positioned and functioning. Contrast injection demonstrates brisk passage of contrast through the biliary drainage catheter to the level of the duodenum. There is no definitive opacification of the intrahepatic biliary tree despite vigorous contrast injection. IMPRESSION: Appropriately positioned and functioning right hepatic approach percutaneous biliary drainage catheter. PLAN: My assumption is the lack of opacification of the intrahepatic biliary tree is secondary to the lack of any significant intrahepatic biliary ductal dilatation as was suggested both on the preceding abdominal CT performed 05/19/2018 as well as the ultrasound and fluoroscopic guided biliary drainage catheter placement performed 05/26/2018. To confirm this assumption, I will obtain a IV only CT scan of the abdomen. If there is no significant intrahepatic biliary duct dilatation, the etiology of the patient's elevated LFTs  is likely secondary to a non obstructive etiology. If intrahepatic biliary duct dilatation is seen on the abdominal CT, would pursue fluoroscopic guided biliary drainage catheter exchange and potential up sizing. Electronically Signed   By: Sandi Mariscal M.D.   On: 06/01/2018 17:33   Ir Int Lianne Cure Biliary Drain With Cholangiogram  Result Date: 05/28/2018 INDICATION: Malignant obstructive jaundice, unsuccessful ERCP stent placement EXAM: FLUOROSCOPIC RIGHT INTERNAL EXTERNAL 10 FRENCH BILIARY DRAIN MEDICATIONS: 3.375 G ZOSYN; The antibiotic was administered within an appropriate time frame prior to the initiation of the procedure. ANESTHESIA/SEDATION: Moderate (conscious) sedation was employed during this procedure. A total of Versed 3.0 mg and Fentanyl 150 mcg was administered intravenously. Moderate Sedation Time: 18 minutes. The patient's level of consciousness and vital signs were monitored continuously by radiology nursing throughout the procedure under my direct supervision. FLUOROSCOPY TIME:  Fluoroscopy Time: 2 minutes 30 seconds (140 mGy). COMPLICATIONS: None immediate. PROCEDURE: Informed written consent was obtained from the patient after a thorough discussion of the procedural risks, benefits and alternatives. All questions were addressed. Maximal Sterile Barrier Technique was utilized including caps, mask, sterile gowns, sterile gloves, sterile drape, hand hygiene and skin antiseptic. A timeout was performed prior to the initiation of the procedure. Under sterile conditions and local anesthesia, percutaneous ultrasound access performed of a peripheral dilated right hepatic duct. Images obtained for documentation. There was return of bile. Contrast injection performed for initial cholangiogram demonstrating mild biliary dilatation. Guidewire advanced centrally within the biliary tree followed by the Accustick dilator set. Kumpe catheter and guidewire were advanced into the duodenum. Contrast injection  confirms position in the duodenum. Amplatz guidewire inserted. Tract dilatation performed to insert a 10 [place] drain. Retention loop formed in the duodenum. Contrast injection confirms drainage of the bile ducts. Catheter secured with a Prolene suture. External gravity drainage bag connected. No immediate complication. Patient tolerated the procedure well. IMPRESSION: Successful ultrasound 10 French right internal external biliary  drain. Electronically Signed   By: Jerilynn Mages.  Shick M.D.   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 Patient has an elevated bilirubin despite having an internal/external biliary drain. Recent CT demonstrated mild intrahepatic biliary dilatation. Patient presents for cholangiogram and tube exchange and up sizing. EXAM: CHOLANGIOGRAM THROUGH EXISTING CATHETER EXCHANGE OF BILIARY CATHETER WITH FLUOROSCOPY MEDICATIONS: Zosyn 3.375 g; The antibiotic was administered within an appropriate time frame prior to the initiation of the procedure. ANESTHESIA/SEDATION: Moderate (conscious) sedation was employed during this procedure. A total of Versed 3 mg and Fentanyl 100 mcg was administered intravenously. Moderate Sedation Time: 20 minutes. The patient's level of consciousness and vital signs were monitored continuously by radiology nursing throughout the procedure under my direct supervision. FLUOROSCOPY TIME:  Fluoroscopy Time: 3 minutes 30 seconds (116 mGy). COMPLICATIONS: None immediate. PROCEDURE: Informed written consent was obtained from the patient after a thorough discussion of the procedural risks, benefits and alternatives. All questions were addressed. Maximal Sterile Barrier Technique was utilized including caps, mask, sterile gowns, sterile gloves, sterile drape, hand hygiene and skin antiseptic. A timeout was performed prior to the initiation of the procedure. The existing catheter was prepped and  draped in sterile fashion. Contrast was injected through the tube. The tube was cut and removed over a Bentson wire. A 7 French sheath was advanced into the liver over the wire. Cholangiogram performed through this sheath. A new 12 [place] biliary drain was selected. Two additional sideholes were created. The tube was advanced over the wire with mild difficulty. Tube was advanced into the duodenum. Catheter was injected with contrast and flushed with saline. Skin was anesthetized with 1% lidocaine. Catheter was sutured to skin with suture and attached to gravity bag. FINDINGS: The sheath cholangiogram demonstrated narrowing of the proximal extrahepatic biliary system and central intrahepatic ducts. Opacification of the intrahepatic biliary system. No significant filling of left intrahepatic ducts and similar to the previous cholangiograms. New drain was advanced into the duodenum. There is filling of the right intrahepatic ducts after placement of the new tube. IMPRESSION: Narrowing of the central intrahepatic ducts and proximal extrahepatic biliary system. Not clear if this narrowing is related to sludge or tumor involvement. The drain was successfully exchanged and upsized to 12 [place]. Additional sideholes were created to help with biliary drainage. Electronically Signed   By: Markus Daft M.D.   On: 06/03/2018 15:05   Ir Imaging Guided Port Insertion  Result Date: 05/31/2018 INDICATION: History of metastatic pancreatic cancer, in need of durable intravenous access for chemotherapy administration. EXAM: IMPLANTED PORT A CATH PLACEMENT WITH ULTRASOUND AND FLUOROSCOPIC GUIDANCE COMPARISON:  CT the chest, abdomen pelvis - 05/27/2018 MEDICATIONS: Cleocin 900 mg IV; The antibiotic was administered within an appropriate time interval prior to skin puncture. ANESTHESIA/SEDATION: Moderate (conscious) sedation was employed during this procedure. A total of Versed 2 mg and Fentanyl 100 mcg was administered intravenously.  Moderate Sedation Time: 27 minutes. The patient's level of consciousness and vital signs were monitored continuously by radiology nursing throughout the procedure under my direct supervision. CONTRAST:  None FLUOROSCOPY TIME:  12 seconds (9 mGy) COMPLICATIONS: None immediate. PROCEDURE: The procedure, risks, benefits, and alternatives were explained to the patient. Questions regarding the procedure were encouraged and answered. The patient understands and consents to the procedure. The right neck and chest were prepped with chlorhexidine in a sterile fashion, and a sterile drape was applied covering the operative field. Maximum barrier sterile  technique with sterile gowns and gloves were used for the procedure. A timeout was performed prior to the initiation of the procedure. Local anesthesia was provided with 1% lidocaine with epinephrine. After creating a small venotomy incision, a micropuncture kit was utilized to access the internal jugular vein. Real-time ultrasound guidance was utilized for vascular access including the acquisition of a permanent ultrasound image documenting patency of the accessed vessel. The microwire was utilized to measure appropriate catheter length. A subcutaneous port pocket was then created along the upper chest wall utilizing a combination of sharp and blunt dissection. The pocket was irrigated with sterile saline. A single lumen thin power injectable port was chosen for placement. The 8 Fr catheter was tunneled from the port pocket site to the venotomy incision. The port was placed in the pocket. The external catheter was trimmed to appropriate length. At the venotomy, an 8 Fr peel-away sheath was placed over a guidewire under fluoroscopic guidance. The catheter was then placed through the sheath and the sheath was removed. Final catheter positioning was confirmed and documented with a fluoroscopic spot radiograph. The port was accessed with a Huber needle, aspirated and flushed with  heparinized saline. The venotomy site was closed with an interrupted 4-0 Vicryl suture. The port pocket incision was closed with interrupted 2-0 Vicryl suture and the skin was opposed with a running subcuticular 4-0 Vicryl suture. Dermabond and Steri-strips were applied to both incisions. Dressings were placed. The patient tolerated the procedure well without immediate post procedural complication. FINDINGS: After catheter placement, the tip lies within the superior cavoatrial junction. The catheter aspirates and flushes normally and is ready for immediate use. IMPRESSION: Successful placement of a right internal jugular approach power injectable Port-A-Cath. The catheter is ready for immediate use. Electronically Signed   By: Sandi Mariscal M.D.   On: 05/31/2018 11:41    (Echo, Carotid, EGD, Colonoscopy, ERCP)    Subjective:   Discharge Exam: Vitals:   06/03/18 2125 06/04/18 0547  BP: 123/82 117/70  Pulse: 75 77  Resp: 19 15  Temp: 98.6 F (37 C) 99.6 F (37.6 C)  SpO2: 97% 95%   Vitals:   06/03/18 1150 06/03/18 1453 06/03/18 2125 06/04/18 0547  BP: (!) 145/87 120/78 123/82 117/70  Pulse: 70 72 75 77  Resp: (!) '21 18 19 15  '$ Temp:  98.6 F (37 C) 98.6 F (37 C) 99.6 F (37.6 C)  TempSrc:  Oral Oral Oral  SpO2: 90% 97% 97% 95%  Weight:      Height:        General: Pt is alert, awake, not in acute distress Cardiovascular: RRR, S1/S2 +, no rubs, no gallops Respiratory: CTA bilaterally, no wheezing, no rhonchi Abdominal: Soft, NT, ND, bowel sounds + Extremities: no edema, no cyanosis    The results of significant diagnostics from this hospitalization (including imaging, microbiology, ancillary and laboratory) are listed below for reference.     Microbiology: No results found for this or any previous visit (from the past 240 hour(s)).   Labs: BNP (last 3 results) No results for input(s): BNP in the last 8760 hours. Basic Metabolic Panel: Recent Labs  Lab 05/31/18 0419  06/01/18 0615 06/02/18 0518 06/03/18 0500 06/04/18 0620  NA 138 137 139 135 138  K 3.8 3.7 3.5 3.4* 3.7  CL 101 99* 100* 99* 97*  CO2 '28 29 25 26 28  '$ GLUCOSE 117* 123* 129* 276* 129*  BUN '7 6 9 10 11  '$ CREATININE 0.54* 0.58* 0.74 0.59*  0.76  CALCIUM 8.8* 8.5* 9.0 8.2* 9.0   Liver Function Tests: Recent Labs  Lab 05/31/18 0419 06/01/18 0615 06/02/18 0518 06/03/18 0500 06/04/18 0620  AST 222* 243* 245* 159* 245*  ALT 246* 234* 248* 188* 227*  ALKPHOS 454* 496* 585* 467* 465*  BILITOT 9.7* 9.5* 11.1* 9.1* 7.9*  PROT 6.9 6.4* 7.6 6.7 7.6  ALBUMIN 2.9* 2.6* 3.2* 2.7* 3.0*   No results for input(s): LIPASE, AMYLASE in the last 168 hours. No results for input(s): AMMONIA in the last 168 hours. CBC: Recent Labs  Lab 05/29/18 0442 06/02/18 0518 06/03/18 0500 06/04/18 0620  WBC 10.6* 10.4 12.2* 10.4  NEUTROABS  --  7.3 10.5* 8.9*  HGB 12.6* 12.1* 10.7* 11.4*  HCT 38.2* 36.2* 32.1* 34.2*  MCV 92.3 90.0 91.5 92.2  PLT 429* 532* 503* 541*   Cardiac Enzymes: No results for input(s): CKTOTAL, CKMB, CKMBINDEX, TROPONINI in the last 168 hours. BNP: Invalid input(s): POCBNP CBG: No results for input(s): GLUCAP in the last 168 hours. D-Dimer No results for input(s): DDIMER in the last 72 hours. Hgb A1c No results for input(s): HGBA1C in the last 72 hours. Lipid Profile No results for input(s): CHOL, HDL, LDLCALC, TRIG, CHOLHDL, LDLDIRECT in the last 72 hours. Thyroid function studies No results for input(s): TSH, T4TOTAL, T3FREE, THYROIDAB in the last 72 hours.  Invalid input(s): FREET3 Anemia work up No results for input(s): VITAMINB12, FOLATE, FERRITIN, TIBC, IRON, RETICCTPCT in the last 72 hours. Urinalysis    Component Value Date/Time   COLORURINE YELLOW 05/19/2018 1259   APPEARANCEUR CLEAR 05/19/2018 1259   LABSPEC <1.005 (L) 05/19/2018 1259   PHURINE 7.0 05/19/2018 1259   GLUCOSEU NEGATIVE 05/19/2018 1259   HGBUR NEGATIVE 05/19/2018 1259   HGBUR negative  09/21/2009 1223   BILIRUBINUR NEGATIVE 05/19/2018 1259   BILIRUBINUR n 04/03/2015 1041   KETONESUR 15 (A) 05/19/2018 1259   PROTEINUR NEGATIVE 05/19/2018 1259   UROBILINOGEN 0.2 04/03/2015 1041   UROBILINOGEN 0.2 09/21/2009 1223   NITRITE NEGATIVE 05/19/2018 1259   LEUKOCYTESUR NEGATIVE 05/19/2018 1259   Sepsis Labs Invalid input(s): PROCALCITONIN,  WBC,  LACTICIDVEN Microbiology No results found for this or any previous visit (from the past 240 hour(s)).   Time coordinating discharge: 41mnutes  SIGNED:   EGeorgette Shell MD  [facility] 06/04/2018, 11:52 AM Pager   If 7PM-7AM, please contact night-coverage [email or web address] Password [email or web address]

## 2018-06-04 NOTE — Progress Notes (Signed)
[name] is doing pretty well this morning.  He does not look as jaundiced.  His bilirubin is down to 7.9.  He had the catheter size increased.  He is having some discomfort overall on the right side.  He will finish up his first cycle of FOLFOX today.  He would like to go home.  From my point of view, I think he would be able to go home.  He has had no nausea or vomiting.  He has had no diarrhea.  He has had no mouth sores.  His CBC shows a white cell count of 10.4.  Hemoglobin 11.4.  Platelet count 541,000.  His sodium is 138.  Potassium 3.7.  Creatinine 0.76.  His total protein is 7.6.  Albumin is 3.0.  He is out of bed.  He is eating okay.  His appetite is not that great.  Hopefully, he will be able to go home today.  We probably have to get him back in the office next week so we can see how his blood counts look.  Again with his elevated bilirubin, we dose reduced the 5-FU significantly.  I would like to follow-up with his labs in a week.  It is apparent that he has had wonderful care from all the staff up on [address].  Pete Raman [place],MD

## 2018-06-04 NOTE — Progress Notes (Signed)
This CM spoke with pt at bedside for discharge planning. Pt with new bili drain. RN to do teaching with pt prior to discharge. Choice offered for home health services and [facility] chosen. [facility] rep alerted of referral. Will need MD order for [facility] Marney Doctor RN,BSN,NCM [phone]

## 2018-06-07 ENCOUNTER — Telehealth: Payer: Self-pay | Admitting: Family Medicine

## 2018-06-07 ENCOUNTER — Other Ambulatory Visit: Payer: Self-pay | Admitting: Family Medicine

## 2018-06-07 ENCOUNTER — Ambulatory Visit: Payer: 59 | Admitting: Family Medicine

## 2018-06-07 ENCOUNTER — Encounter: Payer: Self-pay | Admitting: Family Medicine

## 2018-06-07 VITALS — BP 131/89 | HR 97 | Temp 97.7°F | Resp 20 | Ht 71.0 in | Wt 191.5 lb

## 2018-06-07 DIAGNOSIS — K838 Other specified diseases of biliary tract: Secondary | ICD-10-CM

## 2018-06-07 DIAGNOSIS — K319 Disease of stomach and duodenum, unspecified: Secondary | ICD-10-CM

## 2018-06-07 DIAGNOSIS — C169 Malignant neoplasm of stomach, unspecified: Secondary | ICD-10-CM | POA: Diagnosis not present

## 2018-06-07 DIAGNOSIS — C801 Malignant (primary) neoplasm, unspecified: Secondary | ICD-10-CM | POA: Diagnosis not present

## 2018-06-07 DIAGNOSIS — C799 Secondary malignant neoplasm of unspecified site: Secondary | ICD-10-CM

## 2018-06-07 DIAGNOSIS — R748 Abnormal levels of other serum enzymes: Secondary | ICD-10-CM | POA: Diagnosis not present

## 2018-06-07 DIAGNOSIS — K8689 Other specified diseases of pancreas: Secondary | ICD-10-CM

## 2018-06-07 DIAGNOSIS — K869 Disease of pancreas, unspecified: Secondary | ICD-10-CM

## 2018-06-07 DIAGNOSIS — K3189 Other diseases of stomach and duodenum: Secondary | ICD-10-CM

## 2018-06-07 DIAGNOSIS — Z9289 Personal history of other medical treatment: Secondary | ICD-10-CM

## 2018-06-07 DIAGNOSIS — K831 Obstruction of bile duct: Secondary | ICD-10-CM

## 2018-06-07 LAB — COMPREHENSIVE METABOLIC PANEL
ALBUMIN: 4 g/dL (ref 3.5–5.2)
ALK PHOS: 534 U/L — AB (ref 39–117)
ALT: 173 U/L — ABNORMAL HIGH (ref 0–53)
AST: 157 U/L — ABNORMAL HIGH (ref 0–37)
BUN: 13 mg/dL (ref 6–23)
CHLORIDE: 94 meq/L — AB (ref 96–112)
CO2: 29 mEq/L (ref 19–32)
Calcium: 9.9 mg/dL (ref 8.4–10.5)
Creatinine, Ser: 0.92 mg/dL (ref 0.40–1.50)
GFR: 90.91 mL/min (ref >60.00)
Glucose, Bld: 125 mg/dL — ABNORMAL HIGH (ref 70–99)
POTASSIUM: 4.8 meq/L (ref 3.5–5.1)
Sodium: 136 mEq/L (ref 135–145)
Total Bilirubin: 9.7 mg/dL — ABNORMAL HIGH (ref 0.2–1.2)
Total Protein: 7.5 g/dL (ref 6.0–8.3)

## 2018-06-07 LAB — CBC WITH DIFFERENTIAL/PLATELET
BASOS PCT: 0.5 % (ref 0.0–3.0)
Basophils Absolute: 0.1 10*3/uL (ref 0.0–0.1)
EOS PCT: 0.8 % (ref 0.0–5.0)
Eosinophils Absolute: 0.1 10*3/uL (ref 0.0–0.7)
HCT: 33.5 % — ABNORMAL LOW (ref 39.0–52.0)
Hemoglobin: 11.3 g/dL — ABNORMAL LOW (ref 13.0–17.0)
LYMPHS ABS: 1.5 10*3/uL (ref 0.7–4.0)
Lymphocytes Relative: 12.8 % (ref 12.0–46.0)
MCHC: 33.6 g/dL (ref 30.0–36.0)
MCV: 92.4 fl (ref 78.0–100.0)
MONO ABS: 0.6 10*3/uL (ref 0.1–1.0)
MONOS PCT: 5.4 % (ref 3.0–12.0)
Neutro Abs: 9.4 10*3/uL — ABNORMAL HIGH (ref 1.4–7.7)
Neutrophils Relative %: 80.5 % — ABNORMAL HIGH (ref 43.0–77.0)
Platelets: 473 10*3/uL — ABNORMAL HIGH (ref 150.0–400.0)
RBC: 3.63 Mil/uL — AB (ref 4.22–5.81)
RDW: 14.5 % (ref 11.5–15.5)
WBC: 11.7 10*3/uL — ABNORMAL HIGH (ref 4.0–10.5)

## 2018-06-07 MED ORDER — PROCHLORPERAZINE MALEATE 10 MG PO TABS: 10.0000 mg | ORAL_TABLET | Freq: Four times a day (QID) | ORAL | 0 refills | Status: DC | PRN

## 2018-06-07 MED ORDER — OXYCODONE HCL 5 MG PO TABS: 5.0000 mg | ORAL_TABLET | Freq: Three times a day (TID) | ORAL | 0 refills | Status: AC | PRN

## 2018-06-07 NOTE — Telephone Encounter (Signed)
Please inform patient the following information: - his bilirubin is increasing up to 9.7. - his lft are improving slowly, but alk phos elevating. - he has an appt with Oncology today (06/08/2018), they will go over his labs also.

## 2018-06-07 NOTE — Progress Notes (Addendum)
[name] , [birth year], 57 y.o., male MRN: [id number] Patient Care Team    Relationship Specialty Notifications Start End  Ma Hillock, DO PCP - General Family Medicine  04/09/16     Chief Complaint  Patient presents with  . Hospitalization Follow-up    TCM     Subjective:  [name]  is a 57 y.o. male presents for hospital follow up after recent admission on 05/27/2018 for primary diagnosis abdominal pain with abnormal CT. Patient was discharged on 06/04/2018 to home with home health. Patients discharge summary has been reviewed, as well as all labs/image studies obtained during hospitalization.   Patients hospital course: Mr. [name] is a 57 year old male with a history of melanoma, GERD, chronic constipation who was admitted for an abnormal CT scan pleated secondary to abdominal pain and nausea.  His CT revealed a mass near the pancreas/stomach area.  An EGD was done with biopsy pathology report suggest a gastric origin.  He has had elevated liver enzymes and bilirubin.  A percutaneous biliary drain was placed by IR.  Port-A-Cath was also inserted secondary to his oncology needs. Since hospital discharge patient reports overall he is doing okay.  He is needing the antinausea medications and the oral pain medications routinely, oxycodone immediate release 5 mg every 6 hours.  He is also on a fentanyl patch.  Home health is assisting in wound/drain care.  He reports so far he is tolerating chemo, however he starting to get really tired.  He is taking the MiraLAX routinely and is not having any difficulty with constipation.  He has follow-up with oncology 06/08/2018.  He is hoping to find out the origin of his cancer, he is getting frustrated with the uncertainty of diagnosis.  Recent Labs  Lab 06/02/18 0518 06/03/18 0500 06/04/18 0620  HGB 12.1* 10.7* 11.4*  HCT 36.2* 32.1* 34.2*  WBC 10.4 12.2* 10.4  PLT 532* 503* 541*   CMP Latest Ref Rng & Units 06/04/2018  06/03/2018 06/02/2018  Glucose 65 - 99 mg/dL 129(H) 276(H) 129(H)  BUN 6 - 20 mg/dL _0 Creatinine 0.61 - 1.24 mg/dL 0.76 0.59(L) 0.74  Sodium 135 - 145 mmol/L 138 135 139  Potassium 3.5 - 5.1 mmol/L 3.7 3.4(L) 3.5  Chloride 101 - 111 mmol/L 97(L) 99(L) 100(L)  CO2 22 - 32 mmol/L _1 Calcium 8.9 - 10.3 mg/dL 9.0 8.2(L) 9.0  Total Protein 6.5 - 8.1 g/dL 7.6 6.7 7.6  Total Bilirubin 0.3 - 1.2 mg/dL 7.9(H) 9.1(H) 11.1(H)  Alkaline Phos 38 - 126 U/L 465(H) 467(H) 585(H)  AST 15 - 41 U/L 245(H) 159(H) 245(H)  ALT 17 - 63 U/L 227(H) 188(H) 248(H)    Depression screen [facility] 2/9 04/20/2018 01/15/2018 04/17/2017  Decreased Interest 0 0 0  Down, Depressed, Hopeless 0 0 0  PHQ - 2 Score 0 0 0    Allergies  Allergen Reactions  . Cephalexin Other (See Comments)    "Sore throat" REACTION: SORE THROAT  . Other     Bee sting  . Latex Rash  . Tape Rash   Social History   Tobacco Use  . Smoking status: Former Smoker    Packs/day: 1.00    Last attempt to quit: 12/07/2011    Years since quitting: 6.5  . Smokeless tobacco: Never Used  Substance Use Topics  . Alcohol use: Yes   Past Medical History:  Diagnosis Date  . Allergy   . Arthritis   .  Asplenia   . Basal cell carcinoma   . Erectile dysfunction   . Facial fracture ([place])   . GERD (gastroesophageal reflux disease)   . History of chicken pox   . Melanoma of thigh ([place])    left  . 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 Squamous cell carcinoma, arm    left   Past Surgical History:  Procedure Laterality Date  . BIOPSY  05/28/2018   Procedure: BIOPSY;  Surgeon: Milus Banister, MD;  Location: [facility];  Service: Endoscopy;;  . CHOLECYSTECTOMY  2009  . ESOPHAGOGASTRODUODENOSCOPY (EGD) WITH PROPOFOL Left 05/28/2018   Procedure: ESOPHAGOGASTRODUODENOSCOPY (EGD) WITH PROPOFOL;  Surgeon: Milus Banister, MD;  Location: [facility];  Service: Endoscopy;  Laterality: Left;  . IR CHOLANGIOGRAM EXISTING TUBE   06/01/2018  . IR EXCHANGE BILIARY DRAIN  06/03/2018  . IR IMAGING GUIDED PORT INSERTION  05/31/2018  . IR INT EXT BILIARY DRAIN WITH CHOLANGIOGRAM  05/28/2018  . MANDIBLE SURGERY    . MELANOMA EXCISION WITH SENTINEL LYMPH NODE BIOPSY  1999  . NASAL SINUS SURGERY    . ORIF ORBITAL FRACTURE    . PALATE SURGERY    . right arm  09/14/08   x2 plates/screws  . SPLENECTOMY, TOTAL  2008   trauma  . zygomtic Left 2015   fracture/plate-screw   Family History  Problem Relation Age of Onset  . Arthritis Mother   . Heart disease Mother   . Arthritis Father   . Hearing loss Father   . Pancreatic cancer Maternal Grandfather   . Pancreatic cancer Paternal Grandfather    Allergies as of 06/07/2018      Reactions   Cephalexin Other (See Comments)   "Sore throat" REACTION: SORE THROAT   Other    Bee sting   Latex Rash   Tape Rash      Medication List        Accurate as of 06/07/18  2:20 PM. Always use your most recent med list.          cetirizine 10 MG tablet Commonly known as:  ZYRTEC Take 10 mg by mouth daily.   esomeprazole 20 MG capsule Commonly known as:  NEXIUM TAKE ONE CAPSULE BY MOUTH EVERY DAY   fentaNYL 25 MCG/HR patch Commonly known as:  [place] - dosed mcg/hr Place 1 patch (25 mcg total) onto the skin every 3 (three) days.   flintstones complete 60 MG chewable tablet Chew 1 tablet by mouth daily.   fluconazole 200 MG tablet Commonly known as:  DIFLUCAN Take 1 tablet (200 mg total) by mouth daily.   halobetasol 0.05 % ointment Commonly known as:  ULTRAVATE APPLY TO AFFECTED AREA TWICE A DAY   hydrocortisone cream 1 % Apply 1 application topically daily as needed for itching.   oxyCODONE 5 MG immediate release tablet Commonly known as:  Oxy IR/ROXICODONE Take 1 tablet (5 mg total) by mouth every 8 (eight) hours as needed for severe pain or breakthrough pain.   polyethylene glycol packet Commonly known as:  MIRALAX / GLYCOLAX Take 17 g by mouth daily.     prochlorperazine 10 MG tablet Commonly known as:  COMPAZINE Take 1 tablet (10 mg total) by mouth every 6 (six) hours as needed for nausea or vomiting.   sildenafil 20 MG tablet Commonly known as:  REVATIO 1-5 tabs PO prior to sexually activity.   VITAMIN B 12 PO Take 1,000 mcg by mouth daily.       All  past medical history, surgical history, allergies, family history, immunizations and medications were updated in the EMR today and reviewed under the history and medication portions of their EMR.      ROS: Negative, with the exception of above mentioned in HPI   Objective:  BP 131/89 (BP Location: Right Arm, Patient Position: Sitting, Cuff Size: Normal)   Pulse 97   Temp 97.7 F (36.5 C)   Resp 20   Ht _0  (1.803 m)   Wt 191 lb 8 oz (86.9 kg)   SpO2 96%   BMI 26.71 kg/m  Body mass index is 26.71 kg/m. Gen: Afebrile. No acute distress.  Jaundice appearing.  Appears weak. HENT: AT. [place].  MMM, no oral lesions. Eyes:Pupils Equal Round Reactive to light, Extraocular movements intact,  Conjunctiva without redness, discharge. Icterus present.  Neck/lymp/endocrine: Supple, normal range of motion, no lymphadenopathy CV: RRR no murmur, no edema Chest: CTAB, no wheeze or crackles. Good air movement, normal resp effort.  Abd: Soft.  BS sent.  Abdomen with diffuse tenderness, biliary drainage catheter on the right side of abdomen- draining dark clear bile. MSK: Normal range of motion. Skin: no rashes, purpura or petechiae.  Neuro:  Normal gait. PERLA. EOMi. Alert. Oriented x3  Psych: Normal affect, dress and demeanor. Normal speech. Normal thought content and judgment.    Assessment/Plan: CORIE VAVRA is a 57 y.o. 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  -His pain is present but manageable on current doses of fentanyl patch and oxycodone IR 5 mg every 6 hours.  He is not having oversedation.  Advised him over the next few weeks to attempt to taper back on narcotic medications if able, using the least amount of narcotic to remain comfortable.  Discussed with him, this office does not manage fentanyl patches.  However oncology will more than likely take over management of his pain and make sure he remains comfortable.  If for some reason oral pain medicines are not managed by oncology, he can certainly follow here or we can refer to pain management if he desires. -He has an appointment tomorrow with oncology and we will collect labs today in hopes that they can review with him tomorrow. - [place] reviewed and appropriate.  - oxyCODONE (OXY IR/ROXICODONE) 5 MG immediate release tablet; Take 1 tablet (5 mg total) by mouth every 8 (eight) hours as needed for severe pain or breakthrough pain.  Dispense: 120 tablet; Refill: 0 - prochlorperazine (COMPAZINE) 10 MG tablet; Take 1 tablet (10 mg total) by mouth every 6 (six) hours as needed for nausea or vomiting.  Dispense: 120 tablet; Refill: 0 - CBC w/Diff - Comp Met (CMET) -Follow-up with oncology scheduled.    Reviewed expectations re: course of current medical issues.  Discussed self-management of symptoms.  Outlined signs and symptoms indicating need for more acute intervention.  Patient verbalized understanding and all questions were answered.  Patient received an After-Visit Summary.  Any changes in medications were reviewed and patient was provided with updated med list with their AVS.      Orders Placed This Encounter  Procedures  . CBC w/Diff  . Comp Met (CMET)     Note is dictated utilizing voice recognition software. Although note has been proof read prior to signing, occasional  typographical errors still can  be missed. If any questions arise, please do not hesitate to call for verification.   electronically signed by:  Howard Pouch, DO  [place]

## 2018-06-07 NOTE — Patient Instructions (Addendum)
Taper off the fentyl and continue oxy for the pain. Try to cut back to every 8 hours as needed only.  Also filled the compazine (nause med), try every 8 hours as needed if you can   We will complete labs today, so Dr. Marin Olp will have them tomorrow.   If needing additional pain from [place], your cancer doctor may take over, but if not can come here in 1 month if needing refill. If becomes long term med, will need to consider pain management.   Take it easy. You are going to need rest.   We are here if need anything.

## 2018-06-07 NOTE — Telephone Encounter (Signed)
patient seen by Dr Raoul Pitch today.

## 2018-06-08 ENCOUNTER — Inpatient Hospital Stay: Payer: 59

## 2018-06-08 ENCOUNTER — Inpatient Hospital Stay: Payer: 59 | Attending: Hematology & Oncology | Admitting: Hematology & Oncology

## 2018-06-08 ENCOUNTER — Encounter: Payer: Self-pay | Admitting: Family Medicine

## 2018-06-08 ENCOUNTER — Encounter: Payer: Self-pay | Admitting: *Deleted

## 2018-06-08 ENCOUNTER — Other Ambulatory Visit: Payer: Self-pay

## 2018-06-08 VITALS — BP 112/80 | HR 87 | Temp 98.3°F | Resp 20 | Wt 189.0 lb

## 2018-06-08 DIAGNOSIS — Z79899 Other long term (current) drug therapy: Secondary | ICD-10-CM | POA: Diagnosis not present

## 2018-06-08 DIAGNOSIS — C787 Secondary malignant neoplasm of liver and intrahepatic bile duct: Secondary | ICD-10-CM | POA: Diagnosis not present

## 2018-06-08 DIAGNOSIS — G893 Neoplasm related pain (acute) (chronic): Secondary | ICD-10-CM | POA: Diagnosis not present

## 2018-06-08 DIAGNOSIS — C169 Malignant neoplasm of stomach, unspecified: Secondary | ICD-10-CM | POA: Insufficient documentation

## 2018-06-08 DIAGNOSIS — C799 Secondary malignant neoplasm of unspecified site: Secondary | ICD-10-CM

## 2018-06-08 DIAGNOSIS — R530 Neoplastic (malignant) related fatigue: Secondary | ICD-10-CM | POA: Diagnosis not present

## 2018-06-08 LAB — CBC WITH DIFFERENTIAL (CANCER CENTER ONLY)
Basophils Absolute: 0 10*3/uL (ref 0.0–0.1)
Basophils Relative: 0 %
Eosinophils Absolute: 0 10*3/uL (ref 0.0–0.5)
Eosinophils Relative: 0 %
HCT: 33.8 % — ABNORMAL LOW (ref 38.7–49.9)
HEMOGLOBIN: 11.1 g/dL — AB (ref 13.0–17.1)
LYMPHS ABS: 1 10*3/uL (ref 0.9–3.3)
LYMPHS PCT: 8 %
MCH: 30.6 pg (ref 28.0–33.4)
MCHC: 32.8 g/dL (ref 32.0–35.9)
MCV: 93.1 fL (ref 82.0–98.0)
MONOS PCT: 8 %
Monocytes Absolute: 1 10*3/uL — ABNORMAL HIGH (ref 0.1–0.9)
NEUTROS PCT: 84 %
Neutro Abs: 10.3 10*3/uL — ABNORMAL HIGH (ref 1.5–6.5)
Platelet Count: 479 10*3/uL — ABNORMAL HIGH (ref 145–400)
RBC: 3.63 MIL/uL — ABNORMAL LOW (ref 4.20–5.70)
RDW: 15 % (ref 11.1–15.7)
WBC Count: 12.4 10*3/uL — ABNORMAL HIGH (ref 4.0–10.0)

## 2018-06-08 LAB — COMPREHENSIVE METABOLIC PANEL
ALBUMIN: 3.2 g/dL — AB (ref 3.5–5.0)
ALK PHOS: 522 U/L — AB (ref 38–126)
ALT: 158 U/L — AB (ref 0–44)
ANION GAP: 14 (ref 5–15)
AST: 152 U/L — AB (ref 15–41)
BILIRUBIN TOTAL: 8.2 mg/dL — AB (ref 0.3–1.2)
BUN: 14 mg/dL (ref 6–20)
CALCIUM: 9.1 mg/dL (ref 8.9–10.3)
CO2: 28 mmol/L (ref 22–32)
CREATININE: 0.91 mg/dL (ref 0.61–1.24)
Chloride: 95 mmol/L — ABNORMAL LOW (ref 98–111)
GFR calc Af Amer: >60 mL/min (ref >60)
GFR calc non Af Amer: >60 mL/min (ref >60)
GLUCOSE: 143 mg/dL — AB (ref 70–99)
Potassium: 4 mmol/L (ref 3.5–5.1)
SODIUM: 137 mmol/L (ref 135–145)
TOTAL PROTEIN: 8.2 g/dL — AB (ref 6.5–8.1)

## 2018-06-08 MED ORDER — DRONABINOL 5 MG PO CAPS: 5.0000 mg | ORAL_CAPSULE | Freq: Two times a day (BID) | ORAL | 0 refills | Status: DC

## 2018-06-08 NOTE — Telephone Encounter (Signed)
Spoke with patient reviewed lab results . Patient verbalized understanding. 

## 2018-06-08 NOTE — Progress Notes (Unsigned)
GAVE CRITICAL TOTAL BILIRUBIN 8.2 TO JAMIE TRACY, Bert Givans ([place]) 06/08/2018 427PM

## 2018-06-08 NOTE — Progress Notes (Signed)
Hematology and Oncology Follow Up Visit  [name] [id number] [birth year] 57 y.o. 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  I saw him in consultation at [facility] on June 14.  At that time, he presented with jaundice.  He had extensive hepatic metastasis.  He ultimately had an EGD.  He had a biopsy done.  This was done on May 28, 2018.  The pathology report ([id number]) showed adenocarcinoma.  It was consistent with a gastric etiology.  I have sending off mutation analysis.  The tumor did come back positive for HER-2.  He has a percutaneous biliary drain.  This had to be upsized in order to help drain the bile.  I felt we had to treat him as an inpatient as he was having difficulties.  His liver function tests were worsening.  We started him on FOLFOX.  He received this on June 19.  He was discharged on June 21.  He still has the biliary drainage bag attached.  He is having quite a bit of pain.  He is on a fentanyl patch.  He is on oxycodone.  He was told to take the oxycodone every 8 hours.  This is clearly not adequate for him.  We we will make sure that he takes the oxycodone every 4 hours if needed.  I offered to increase the fentanyl patch.  He is okay with the current dosage.  So I first saw him, his CA 19-9 was 23,000.  His appetite is slightly better.  I think his appetite will improve if he gets better pain control.  In addition, I will call in some Marinol for him.  I will try 5 mg p.o. twice daily dosing.  He is having no problems with diarrhea.  He has had no bleeding.  He has had no fever.  Overall, his performance status is ECOG 1-2.  Medications:  Current Outpatient Medications:  .  cetirizine (ZYRTEC) 10 MG tablet, Take 10 mg by mouth daily.  , Disp: , Rfl:  .   Cyanocobalamin (VITAMIN B 12 PO), Take 1,000 mcg by mouth daily., Disp: , Rfl:  .  esomeprazole (NEXIUM) 20 MG capsule, TAKE ONE CAPSULE BY MOUTH EVERY DAY, Disp: 100 capsule, Rfl: 3 .  fentaNYL (DURAGESIC - DOSED MCG/HR) 25 MCG/HR patch, Place 1 patch (25 mcg total) onto the skin every 3 (three) days., Disp: 5 patch, Rfl: 0 .  flintstones complete (FLINTSTONES) 60 MG chewable tablet, Chew 1 tablet by mouth daily., Disp: , Rfl:  .  fluconazole (DIFLUCAN) 200 MG tablet, Take 1 tablet (200 mg total) by mouth daily., Disp: 18 tablet, Rfl: 0 .  halobetasol (ULTRAVATE) 0.05 % ointment, APPLY TO AFFECTED AREA TWICE A DAY, Disp: 50 g, Rfl: 3 .  hydrocortisone cream 1 %, Apply 1 application topically daily as needed for itching., Disp: , Rfl:  .  oxyCODONE (OXY IR/ROXICODONE) 5 MG immediate release tablet, Take 1 tablet (5 mg total) by mouth every 8 (eight) hours as needed for severe pain or breakthrough pain., Disp: 120 tablet, Rfl: 0 .  polyethylene glycol (MIRALAX / GLYCOLAX) packet, Take 17 g by mouth daily., Disp: , Rfl:  .  prochlorperazine (COMPAZINE) 10 MG tablet, Take 1 tablet (10 mg total) by mouth every 6 (six) hours as needed for  nausea or vomiting., Disp: 120 tablet, Rfl: 0 .  sildenafil (REVATIO) 20 MG tablet, 1-5 tabs PO prior to sexually activity., Disp: 50 tablet, Rfl: 4  Allergies:  Allergies  Allergen Reactions  . Cephalexin Other (See Comments)    "Sore throat" REACTION: SORE THROAT  . Other     Bee sting  . Latex Rash  . Tape Rash    Past Medical History, Surgical history, Social history, and Family History were reviewed and updated.  Review of Systems: Review of Systems  Constitutional: Positive for fatigue.  HENT:  Negative.   Eyes: Positive for icterus.  Respiratory: Positive for shortness of breath.   Cardiovascular: Negative.   Gastrointestinal: Positive for abdominal pain, nausea and vomiting.  Endocrine: Negative.   Genitourinary: Negative.    Musculoskeletal:  Positive for arthralgias and myalgias.  Skin: Negative.   Neurological: Negative.   Hematological: Negative.   Psychiatric/Behavioral: Negative.     Physical Exam:  weight is 189 lb (85.7 kg). His oral temperature is 98.3 F (36.8 C). His blood pressure is 112/80 and his pulse is 87. His respiration is 20 and oxygen saturation is 100%.   Wt Readings from Last 3 Encounters:  06/08/18 189 lb (85.7 kg)  06/07/18 191 lb 8 oz (86.9 kg)  05/28/18 195 lb (88.5 kg)    Physical Exam  Constitutional: He is oriented to person, place, and time.  HENT:  Head: Normocephalic and atraumatic.  Mouth/Throat: Oropharynx is clear and moist.  Eyes: Pupils are equal, round, and reactive to light. EOM are normal.  Neck: Normal range of motion.  Cardiovascular: Normal rate, regular rhythm and normal heart sounds.  Pulmonary/Chest: Effort normal and breath sounds normal.  Abdominal: Soft. Bowel sounds are normal.  He has the biliary drainage catheter on the right side of his abdomen.  It is draining dark clear bile.  Musculoskeletal: Normal range of motion. He exhibits no edema, tenderness or deformity.  Lymphadenopathy:    He has no cervical adenopathy.  Neurological: He is alert and oriented to person, place, and time.  Skin: Skin is warm and dry. No rash noted. No erythema.  Psychiatric: He has a normal mood and affect. His behavior is normal. Judgment and thought content normal.  Vitals reviewed.    Lab Results  Component Value Date   WBC 12.4 (H) 06/08/2018   HGB 11.1 (L) 06/08/2018   HCT 33.8 (L) 06/08/2018   MCV 93.1 06/08/2018   PLT 479 (H) 06/08/2018     Chemistry      Component Value Date/Time   NA 137 06/08/2018 1504   K 4.0 06/08/2018 1504   CL 95 (L) 06/08/2018 1504   CO2 28 06/08/2018 1504   BUN 14 06/08/2018 1504   CREATININE 0.91 06/08/2018 1504      Component Value Date/Time   CALCIUM 9.1 06/08/2018 1504   ALKPHOS 522 (H) 06/08/2018 1504   AST 152 (H) 06/08/2018  1504   ALT 158 (H) 06/08/2018 1504   BILITOT 8.2 (H) 06/08/2018 1504       Impression and Plan: [name] is a 57 year old white male.  He has metastatic gastric cancer.  His CA 19-9 was incredibly high.  It is advantageous that the tumor is HER-2 positive.  I will add Herceptin to the FOLFOX.  I think this would help him out.  He will be interesting to see what his genetic markers show.  He will be interesting to see what his PD-L1 level is.  We know  that we are not going to cure this.  Our goal clearly is quality of life.  If we can just improve his quality of life, he will really do well.  Hopefully we will be able to get the biliary drainage bag off of him.  Hopefully the biliary system can be stented internally.  I spent about 45 minutes with he and his dad.  It was nice to see him again.  The whole time was spent face-to-face with him.  I counseled him about his status and he understands that we are dealing with a non-curable disease but if we can at least treat this and get him into a partial remission, he will be feeling better.  We will start a second cycle of treatment on July 8.  We will have to see what his tumor levels are.   Volanda Napoleon, MD 6/25/20196:01 PM

## 2018-06-09 ENCOUNTER — Emergency Department (HOSPITAL_COMMUNITY): Payer: BLUE CROSS/BLUE SHIELD

## 2018-06-09 ENCOUNTER — Telehealth: Payer: Self-pay | Admitting: *Deleted

## 2018-06-09 ENCOUNTER — Inpatient Hospital Stay (HOSPITAL_COMMUNITY)
Admission: EM | Admit: 2018-06-09 | Discharge: 2018-06-16 | DRG: 167 | Disposition: A | Discharge status: Home Health | Payer: BLUE CROSS/BLUE SHIELD | Attending: Internal Medicine | Admitting: Internal Medicine

## 2018-06-09 ENCOUNTER — Encounter (HOSPITAL_COMMUNITY): Payer: Self-pay | Admitting: *Deleted

## 2018-06-09 ENCOUNTER — Other Ambulatory Visit: Payer: Self-pay

## 2018-06-09 DIAGNOSIS — R0789 Other chest pain: Secondary | ICD-10-CM | POA: Diagnosis present

## 2018-06-09 DIAGNOSIS — Z79899 Other long term (current) drug therapy: Secondary | ICD-10-CM

## 2018-06-09 DIAGNOSIS — R0902 Hypoxemia: Secondary | ICD-10-CM | POA: Diagnosis present

## 2018-06-09 DIAGNOSIS — R0603 Acute respiratory distress: Secondary | ICD-10-CM | POA: Diagnosis present

## 2018-06-09 DIAGNOSIS — Z87891 Personal history of nicotine dependence: Secondary | ICD-10-CM

## 2018-06-09 DIAGNOSIS — R188 Other ascites: Secondary | ICD-10-CM | POA: Diagnosis present

## 2018-06-09 DIAGNOSIS — R740 Nonspecific elevation of levels of transaminase and lactic acid dehydrogenase [LDH]: Secondary | ICD-10-CM | POA: Diagnosis present

## 2018-06-09 DIAGNOSIS — I2609 Other pulmonary embolism with acute cor pulmonale: Principal | ICD-10-CM | POA: Diagnosis present

## 2018-06-09 DIAGNOSIS — I361 Nonrheumatic tricuspid (valve) insufficiency: Secondary | ICD-10-CM | POA: Diagnosis not present

## 2018-06-09 DIAGNOSIS — Z91048 Other nonmedicinal substance allergy status: Secondary | ICD-10-CM

## 2018-06-09 DIAGNOSIS — K831 Obstruction of bile duct: Secondary | ICD-10-CM | POA: Diagnosis not present

## 2018-06-09 DIAGNOSIS — I82411 Acute embolism and thrombosis of right femoral vein: Secondary | ICD-10-CM | POA: Diagnosis present

## 2018-06-09 DIAGNOSIS — R109 Unspecified abdominal pain: Secondary | ICD-10-CM | POA: Diagnosis present

## 2018-06-09 DIAGNOSIS — C169 Malignant neoplasm of stomach, unspecified: Secondary | ICD-10-CM | POA: Diagnosis present

## 2018-06-09 DIAGNOSIS — Z85820 Personal history of malignant melanoma of skin: Secondary | ICD-10-CM | POA: Diagnosis not present

## 2018-06-09 DIAGNOSIS — C259 Malignant neoplasm of pancreas, unspecified: Secondary | ICD-10-CM | POA: Diagnosis present

## 2018-06-09 DIAGNOSIS — M7989 Other specified soft tissue disorders: Secondary | ICD-10-CM | POA: Diagnosis not present

## 2018-06-09 DIAGNOSIS — N529 Male erectile dysfunction, unspecified: Secondary | ICD-10-CM | POA: Diagnosis present

## 2018-06-09 DIAGNOSIS — C799 Secondary malignant neoplasm of unspecified site: Secondary | ICD-10-CM | POA: Diagnosis present

## 2018-06-09 DIAGNOSIS — Z91030 Bee allergy status: Secondary | ICD-10-CM | POA: Diagnosis not present

## 2018-06-09 DIAGNOSIS — Z6826 Body mass index (BMI) 26.0-26.9, adult: Secondary | ICD-10-CM | POA: Diagnosis not present

## 2018-06-09 DIAGNOSIS — I82499 Acute embolism and thrombosis of other specified deep vein of unspecified lower extremity: Secondary | ICD-10-CM | POA: Diagnosis present

## 2018-06-09 DIAGNOSIS — Z881 Allergy status to other antibiotic agents status: Secondary | ICD-10-CM | POA: Diagnosis not present

## 2018-06-09 DIAGNOSIS — K2100 Gastro-esophageal reflux disease with esophagitis, without bleeding: Secondary | ICD-10-CM

## 2018-06-09 DIAGNOSIS — R0600 Dyspnea, unspecified: Secondary | ICD-10-CM

## 2018-06-09 DIAGNOSIS — R945 Abnormal results of liver function studies: Secondary | ICD-10-CM | POA: Diagnosis not present

## 2018-06-09 DIAGNOSIS — I82409 Acute embolism and thrombosis of unspecified deep veins of unspecified lower extremity: Secondary | ICD-10-CM | POA: Diagnosis not present

## 2018-06-09 DIAGNOSIS — Z800 Family history of malignant neoplasm of digestive organs: Secondary | ICD-10-CM | POA: Diagnosis not present

## 2018-06-09 DIAGNOSIS — I2699 Other pulmonary embolism without acute cor pulmonale: Secondary | ICD-10-CM

## 2018-06-09 DIAGNOSIS — R7989 Other specified abnormal findings of blood chemistry: Secondary | ICD-10-CM | POA: Diagnosis present

## 2018-06-09 DIAGNOSIS — Z7901 Long term (current) use of anticoagulants: Secondary | ICD-10-CM | POA: Diagnosis not present

## 2018-06-09 DIAGNOSIS — Z91040 Latex allergy status: Secondary | ICD-10-CM | POA: Diagnosis not present

## 2018-06-09 DIAGNOSIS — G8929 Other chronic pain: Secondary | ICD-10-CM | POA: Diagnosis present

## 2018-06-09 DIAGNOSIS — C786 Secondary malignant neoplasm of retroperitoneum and peritoneum: Secondary | ICD-10-CM | POA: Diagnosis not present

## 2018-06-09 DIAGNOSIS — Z85828 Personal history of other malignant neoplasm of skin: Secondary | ICD-10-CM

## 2018-06-09 DIAGNOSIS — E440 Moderate protein-calorie malnutrition: Secondary | ICD-10-CM | POA: Diagnosis present

## 2018-06-09 DIAGNOSIS — R609 Edema, unspecified: Secondary | ICD-10-CM | POA: Diagnosis not present

## 2018-06-09 DIAGNOSIS — F528 Other sexual dysfunction not due to a substance or known physiological condition: Secondary | ICD-10-CM | POA: Diagnosis present

## 2018-06-09 DIAGNOSIS — G893 Neoplasm related pain (acute) (chronic): Secondary | ICD-10-CM | POA: Diagnosis not present

## 2018-06-09 DIAGNOSIS — I82403 Acute embolism and thrombosis of unspecified deep veins of lower extremity, bilateral: Secondary | ICD-10-CM | POA: Diagnosis not present

## 2018-06-09 LAB — IRON AND TIBC
Iron: 23 ug/dL — ABNORMAL LOW (ref 42–163)
SATURATION RATIOS: 7 % — AB (ref 42–163)
TIBC: 314 ug/dL (ref 202–409)
UIBC: 291 ug/dL

## 2018-06-09 LAB — URINALYSIS, ROUTINE W REFLEX MICROSCOPIC
GLUCOSE, UA: NEGATIVE mg/dL
HGB URINE DIPSTICK: NEGATIVE
Ketones, ur: NEGATIVE mg/dL
LEUKOCYTES UA: NEGATIVE
Nitrite: NEGATIVE
PH: 5 (ref 5.0–8.0)
PROTEIN: NEGATIVE mg/dL
SPECIFIC GRAVITY, URINE: 1.024 (ref 1.005–1.030)

## 2018-06-09 LAB — CBC WITH DIFFERENTIAL/PLATELET
BASOS ABS: 0 10*3/uL (ref 0.0–0.1)
BASOS PCT: 0 %
Eosinophils Absolute: 0 10*3/uL (ref 0.0–0.7)
Eosinophils Relative: 0 %
HEMATOCRIT: 34.1 % — AB (ref 39.0–52.0)
Hemoglobin: 11.1 g/dL — ABNORMAL LOW (ref 13.0–17.0)
LYMPHS PCT: 7 %
Lymphs Abs: 1 10*3/uL (ref 0.7–4.0)
MCH: 30.7 pg (ref 26.0–34.0)
MCHC: 32.6 g/dL (ref 30.0–36.0)
MCV: 94.2 fL (ref 78.0–100.0)
MONO ABS: 1.4 10*3/uL — AB (ref 0.1–1.0)
MONOS PCT: 10 %
NEUTROS ABS: 11.9 10*3/uL — AB (ref 1.7–7.7)
Neutrophils Relative %: 83 %
Platelets: 530 10*3/uL — ABNORMAL HIGH (ref 150–400)
RBC: 3.62 MIL/uL — ABNORMAL LOW (ref 4.22–5.81)
RDW: 15.2 % (ref 11.5–15.5)
WBC: 14.3 10*3/uL — ABNORMAL HIGH (ref 4.0–10.5)

## 2018-06-09 LAB — COMPREHENSIVE METABOLIC PANEL
ALBUMIN: 3.2 g/dL — AB (ref 3.5–5.0)
ALT: 153 U/L — AB (ref 0–44)
AST: 144 U/L — ABNORMAL HIGH (ref 15–41)
Alkaline Phosphatase: 522 U/L — ABNORMAL HIGH (ref 38–126)
Anion gap: 13 (ref 5–15)
BUN: 17 mg/dL (ref 6–20)
CHLORIDE: 97 mmol/L — AB (ref 98–111)
CO2: 28 mmol/L (ref 22–32)
CREATININE: 1.04 mg/dL (ref 0.61–1.24)
Calcium: 9.2 mg/dL (ref 8.9–10.3)
GFR calc Af Amer: >60 mL/min (ref >60)
GFR calc non Af Amer: >60 mL/min (ref >60)
Glucose, Bld: 148 mg/dL — ABNORMAL HIGH (ref 70–99)
Potassium: 3.9 mmol/L (ref 3.5–5.1)
SODIUM: 138 mmol/L (ref 135–145)
Total Bilirubin: 8.2 mg/dL — ABNORMAL HIGH (ref 0.3–1.2)
Total Protein: 8.2 g/dL — ABNORMAL HIGH (ref 6.5–8.1)

## 2018-06-09 LAB — FERRITIN: FERRITIN: 597 ng/mL — AB (ref 24–336)

## 2018-06-09 LAB — CANCER ANTIGEN 19-9: CAN 19-9: 26724 U/mL — AB (ref 0–35)

## 2018-06-09 LAB — APTT: aPTT: 31 seconds (ref 24–36)

## 2018-06-09 LAB — PROTIME-INR
INR: 1.4
PROTHROMBIN TIME: 17 s — AB (ref 11.4–15.2)

## 2018-06-09 LAB — CEA (IN HOUSE-CHCC): CEA (CHCC-In House): 47.72 ng/mL — ABNORMAL HIGH (ref 0.00–5.00)

## 2018-06-09 LAB — I-STAT TROPONIN, ED: Troponin i, poc: 0.15 ng/mL (ref 0.00–0.08)

## 2018-06-09 LAB — HEPARIN LEVEL (UNFRACTIONATED): Heparin Unfractionated: 0.57 IU/mL (ref 0.30–0.70)

## 2018-06-09 LAB — BRAIN NATRIURETIC PEPTIDE: B NATRIURETIC PEPTIDE 5: 465.3 pg/mL — AB (ref 0.0–100.0)

## 2018-06-09 LAB — LACTATE DEHYDROGENASE: LDH: 273 U/L — AB (ref 98–192)

## 2018-06-09 LAB — LIPASE, BLOOD: Lipase: 57 U/L — ABNORMAL HIGH (ref 11–51)

## 2018-06-09 LAB — I-STAT CG4 LACTIC ACID, ED: Lactic Acid, Venous: 1.74 mmol/L (ref 0.5–1.9)

## 2018-06-09 MED ORDER — LORATADINE 10 MG PO TABS
10.0000 mg | ORAL_TABLET | Freq: Every day | ORAL | Status: DC
  Administered 2018-06-10 – 2018-06-16 (×7): 10 mg via ORAL
  Filled 2018-06-09 (×7): qty 1

## 2018-06-09 MED ORDER — SENNOSIDES-DOCUSATE SODIUM 8.6-50 MG PO TABS: 1.0000 | ORAL_TABLET | Freq: Every evening | ORAL | Status: DC | PRN

## 2018-06-09 MED ORDER — OXYCODONE HCL 5 MG PO TABS
5.0000 mg | ORAL_TABLET | Freq: Three times a day (TID) | ORAL | Status: DC | PRN
  Administered 2018-06-12 – 2018-06-15 (×6): 5 mg via ORAL
  Filled 2018-06-09 (×6): qty 1

## 2018-06-09 MED ORDER — ADULT MULTIVITAMIN W/MINERALS CH
1.0000 | ORAL_TABLET | Freq: Every day | ORAL | Status: DC
  Administered 2018-06-10 – 2018-06-16 (×7): 1 via ORAL
  Filled 2018-06-09 (×7): qty 1

## 2018-06-09 MED ORDER — FENTANYL 25 MCG/HR TD PT72
25.0000 ug | MEDICATED_PATCH | TRANSDERMAL | Status: DC
  Administered 2018-06-12 – 2018-06-15 (×2): 25 ug via TRANSDERMAL
  Filled 2018-06-09 (×2): qty 1

## 2018-06-09 MED ORDER — VITAMIN B-12 1000 MCG PO TABS
1000.0000 ug | ORAL_TABLET | Freq: Every day | ORAL | Status: DC
  Administered 2018-06-09 – 2018-06-16 (×8): 1000 ug via ORAL
  Filled 2018-06-09 (×8): qty 1

## 2018-06-09 MED ORDER — PROCHLORPERAZINE MALEATE 10 MG PO TABS
10.0000 mg | ORAL_TABLET | Freq: Four times a day (QID) | ORAL | Status: DC | PRN
  Administered 2018-06-10 (×2): 10 mg via ORAL
  Filled 2018-06-09 (×2): qty 1

## 2018-06-09 MED ORDER — IOPAMIDOL (ISOVUE-370) INJECTION 76%
INTRAVENOUS | Status: AC
  Filled 2018-06-09: qty 100

## 2018-06-09 MED ORDER — TRAZODONE HCL 50 MG PO TABS
25.0000 mg | ORAL_TABLET | Freq: Every evening | ORAL | Status: DC | PRN
  Administered 2018-06-11 – 2018-06-15 (×2): 25 mg via ORAL
  Filled 2018-06-09 (×3): qty 1

## 2018-06-09 MED ORDER — ACETAMINOPHEN 325 MG PO TABS: 650.0000 mg | ORAL_TABLET | Freq: Four times a day (QID) | ORAL | Status: DC | PRN

## 2018-06-09 MED ORDER — IOPAMIDOL (ISOVUE-370) INJECTION 76%
100.0000 mL | Freq: Once | INTRAVENOUS | Status: AC | PRN
  Administered 2018-06-09: 100 mL via INTRAVENOUS

## 2018-06-09 MED ORDER — FLINTSTONES COMPLETE 60 MG PO CHEW: 1.0000 | CHEWABLE_TABLET | Freq: Every day | ORAL | Status: DC

## 2018-06-09 MED ORDER — HEPARIN (PORCINE) IN NACL 100-0.45 UNIT/ML-% IJ SOLN
1450.0000 [IU]/h | INTRAMUSCULAR | Status: DC
  Administered 2018-06-09 – 2018-06-14 (×6): 1450 [IU]/h via INTRAVENOUS
  Filled 2018-06-09 (×8): qty 250

## 2018-06-09 MED ORDER — DRONABINOL 2.5 MG PO CAPS
5.0000 mg | ORAL_CAPSULE | Freq: Two times a day (BID) | ORAL | Status: DC
  Administered 2018-06-10 – 2018-06-16 (×10): 5 mg via ORAL
  Filled 2018-06-09 (×12): qty 2

## 2018-06-09 MED ORDER — ACETAMINOPHEN 650 MG RE SUPP: 650.0000 mg | Freq: Four times a day (QID) | RECTAL | Status: DC | PRN

## 2018-06-09 MED ORDER — PANTOPRAZOLE SODIUM 40 MG PO TBEC
40.0000 mg | DELAYED_RELEASE_TABLET | Freq: Every day | ORAL | Status: DC
  Administered 2018-06-10 – 2018-06-16 (×7): 40 mg via ORAL
  Filled 2018-06-09 (×7): qty 1

## 2018-06-09 MED ORDER — POLYETHYLENE GLYCOL 3350 17 G PO PACK
17.0000 g | PACK | Freq: Every day | ORAL | Status: DC
  Administered 2018-06-11 – 2018-06-16 (×6): 17 g via ORAL
  Filled 2018-06-09 (×7): qty 1

## 2018-06-09 MED ORDER — BISACODYL 5 MG PO TBEC: 5.0000 mg | DELAYED_RELEASE_TABLET | Freq: Every day | ORAL | Status: DC | PRN

## 2018-06-09 MED ORDER — FLUCONAZOLE 100 MG PO TABS
200.0000 mg | ORAL_TABLET | Freq: Every day | ORAL | Status: DC
  Administered 2018-06-09 – 2018-06-16 (×8): 200 mg via ORAL
  Filled 2018-06-09 (×6): qty 2
  Filled 2018-06-09: qty 1
  Filled 2018-06-09 (×2): qty 2

## 2018-06-09 MED ORDER — SODIUM CHLORIDE 0.9 % IV SOLN
INTRAVENOUS | Status: DC
  Administered 2018-06-09: 19:00:00 via INTRAVENOUS

## 2018-06-09 MED ORDER — HYDROMORPHONE HCL 1 MG/ML IJ SOLN
1.0000 mg | INTRAMUSCULAR | Status: DC | PRN
  Administered 2018-06-09 – 2018-06-16 (×23): 1 mg via INTRAVENOUS
  Filled 2018-06-09 (×25): qty 1

## 2018-06-09 MED ORDER — HEPARIN BOLUS VIA INFUSION
4500.0000 [IU] | Freq: Once | INTRAVENOUS | Status: AC
Start: 2018-06-09 — End: 2018-06-09
  Administered 2018-06-09: 4500 [IU] via INTRAVENOUS
  Filled 2018-06-09: qty 4500

## 2018-06-09 NOTE — ED Notes (Signed)
ED TO INPATIENT HANDOFF REPORT  Name/Age/Gender [name] 57 y.o. male  Code Status    Code Status Orders  (From admission, onward)        Start     Ordered   06/09/18 1749  Full code  Continuous     06/09/18 1749    Code Status History    Date Active Date Inactive Code Status Order ID Comments User Context   05/27/2018 1807 06/04/2018 1705 Full Code [id number]  Kayleen Memos, DO Inpatient    Advance Directive Documentation     Most Recent Value  Type of Advance Directive  Healthcare Power of [place], Living will  Pre-existing out of facility DNR order (yellow form or pink MOST form)  -  "MOST" Form in Place?  -      Home/SNF/Other Home  Chief Complaint SOB  Level of Care/Admitting Diagnosis ED Disposition    ED Disposition Condition [place]: [place] [[id number]]  Level of Care: Telemetry [5]  Admit to tele based on following criteria: Monitor for Ischemic changes  Diagnosis: Pulmonary embolism with acute cor pulmonale [facility]) [[id number]]  Admitting Physician: Gerlean Ren [facility] [[id number]]  Attending Physician: Gerlean Ren [facility] [[id number]]  Estimated length of stay: past midnight tomorrow  Certification:: I certify this patient will need inpatient services for at least 2 midnights  PT Class (Do Not Modify): Inpatient [101]  PT Acc Code (Do Not Modify): Private [1]       Medical History Past Medical History:  Diagnosis Date  . Allergy   . Arthritis   . Asplenia   . Basal cell carcinoma   . Erectile dysfunction   . Facial fracture ([place])   . GERD (gastroesophageal reflux disease)   . History of chicken pox   . Melanoma of thigh ([place])    left  . 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 Squamous cell carcinoma, arm    left    Allergies Allergies  Allergen Reactions  . Cephalexin Other (See Comments)    "Sore throat" REACTION: SORE THROAT  . Other     Bee sting  . Latex Rash  . Tape Rash     IV Location/Drains/Wounds Patient Lines/Drains/Airways Status   Active Line/Drains/Airways    Name:   Placement date:   Placement time:   Site:   Days:   Implanted Port 05/31/18 Right Chest   05/31/18    1059    Chest   9   Biliary Tube Cook slip-coat 12 Fr. RUQ   06/03/18    1154    RUQ   6          Labs/Imaging Results for orders placed or performed during the hospital encounter of 06/09/18 (from the past 48 hour(s))  Comprehensive metabolic panel     Status: Abnormal   Collection Time: 06/09/18 12:54 PM  Result Value Ref Range   Sodium 138 135 - 145 mmol/L   Potassium 3.9 3.5 - 5.1 mmol/L   Chloride 97 (L) 98 - 111 mmol/L    Comment: Please note change in reference range.   CO2 28 22 - 32 mmol/L   Glucose, Bld 148 (H) 70 - 99 mg/dL    Comment: Please note change in reference range.   BUN 17 6 - 20 mg/dL    Comment: Please note change in reference range.   Creatinine, Ser 1.04 0.61 - 1.24 mg/dL   Calcium 9.2 8.9 - 10.3 mg/dL  Total Protein 8.2 (H) 6.5 - 8.1 g/dL   Albumin 3.2 (L) 3.5 - 5.0 g/dL   AST 144 (H) 15 - 41 U/L   ALT 153 (H) 0 - 44 U/L    Comment: Please note change in reference range.   Alkaline Phosphatase 522 (H) 38 - 126 U/L   Total Bilirubin 8.2 (H) 0.3 - 1.2 mg/dL   GFR calc non Af Amer >60 >60 mL/min   GFR calc Af Amer >60 >60 mL/min    Comment: (NOTE) The eGFR has been calculated using the CKD EPI equation. This calculation has not been validated in all clinical situations. eGFR's persistently <60 mL/min signify possible Chronic Kidney Disease.    Anion gap 13 5 - 15    Comment: Performed at [facility], [place] [address] [place], [place] [address]  Lipase, blood     Status: Abnormal   Collection Time: 06/09/18 12:54 PM  Result Value Ref Range   Lipase 57 (H) 11 - 51 U/L    Comment: Performed at [facility], [place] [address], [place], [place] [address]  Brain natriuretic peptide     Status: Abnormal    Collection Time: 06/09/18 12:54 PM  Result Value Ref Range   B Natriuretic Peptide 465.3 (H) 0.0 - 100.0 pg/mL    Comment: Performed at [facility], [place] [address], [place], [place] [address]  CBC with Differential     Status: Abnormal   Collection Time: 06/09/18 12:54 PM  Result Value Ref Range   WBC 14.3 (H) 4.0 - 10.5 K/uL   RBC 3.62 (L) 4.22 - 5.81 MIL/uL   Hemoglobin 11.1 (L) 13.0 - 17.0 g/dL   HCT 34.1 (L) 39.0 - 52.0 %   MCV 94.2 78.0 - 100.0 fL   MCH 30.7 26.0 - 34.0 pg   MCHC 32.6 30.0 - 36.0 g/dL   RDW 15.2 11.5 - 15.5 %   Platelets 530 (H) 150 - 400 K/uL   Neutrophils Relative % 83 %   Neutro Abs 11.9 (H) 1.7 - 7.7 K/uL   Lymphocytes Relative 7 %   Lymphs Abs 1.0 0.7 - 4.0 K/uL   Monocytes Relative 10 %   Monocytes Absolute 1.4 (H) 0.1 - 1.0 K/uL   Eosinophils Relative 0 %   Eosinophils Absolute 0.0 0.0 - 0.7 K/uL   Basophils Relative 0 %   Basophils Absolute 0.0 0.0 - 0.1 K/uL    Comment: Performed at [facility], [place] [address], [place], [place] [address]  Protime-INR     Status: Abnormal   Collection Time: 06/09/18 12:54 PM  Result Value Ref Range   Prothrombin Time 17.0 (H) 11.4 - 15.2 seconds   INR 1.40     Comment: Performed at [facility], [place] [address], [place], [place] [address]  APTT     Status: None   Collection Time: 06/09/18 12:54 PM  Result Value Ref Range   aPTT 31 24 - 36 seconds    Comment: Performed at [facility], [place] [address], [place], [place] [address]  I-stat troponin, ED     Status: Abnormal   Collection Time: 06/09/18  1:14 PM  Result Value Ref Range   Troponin i, poc 0.15 (HH) 0.00 - 0.08 ng/mL   Comment NOTIFIED PHYSICIAN    Comment 3            Comment: Due to the release kinetics of cTnI, a negative result within  the first hours of the onset of symptoms does not rule out myocardial infarction with certainty. If myocardial infarction is still  suspected, repeat the test at appropriate intervals.   I-Stat CG4 Lactic Acid, ED     Status: None   Collection Time: 06/09/18  1:16 PM  Result Value Ref Range   Lactic Acid, Venous 1.74 0.5 - 1.9 mmol/L  Urinalysis, Routine w reflex microscopic     Status: Abnormal   Collection Time: 06/09/18  3:06 PM  Result Value Ref Range   Color, Urine AMBER (A) YELLOW    Comment: BIOCHEMICALS MAY BE AFFECTED BY COLOR   APPearance CLEAR CLEAR   Specific Gravity, Urine 1.024 1.005 - 1.030   pH 5.0 5.0 - 8.0   Glucose, UA NEGATIVE NEGATIVE mg/dL   Hgb urine dipstick NEGATIVE NEGATIVE   Bilirubin Urine SMALL (A) NEGATIVE   Ketones, ur NEGATIVE NEGATIVE mg/dL   Protein, ur NEGATIVE NEGATIVE mg/dL   Nitrite NEGATIVE NEGATIVE   Leukocytes, UA NEGATIVE NEGATIVE    Comment: Performed at [facility], [place] [address], [place], [place] [address]   Dg Chest 2 View  Result Date: 06/09/2018 CLINICAL DATA:  Shortness of breath. EXAM: CHEST - 2 VIEW COMPARISON:  Radiographs of August 25, 2007. CT scan of May 27, 2018. FINDINGS: Stable cardiomediastinal silhouette. No pneumothorax or pleural effusion is noted. Multiple old bilateral rib fractures are noted. Old bilateral clavicular fractures are noted. Right internal jugular Port-A-Cath is noted with distal tip in expected position of cavoatrial junction. IMPRESSION: Multiple old bilateral rib and clavicular fractures. No acute intracranial abnormality seen. Electronically Signed   By: Marijo Conception, M.D.   On: 06/09/2018 14:06   Ct Angio Chest Pe W/cm &/or Wo Cm  Result Date: 06/09/2018 CLINICAL DATA:  Shortness of breath EXAM: CT ANGIOGRAPHY CHEST CT ABDOMEN AND PELVIS WITH CONTRAST TECHNIQUE: Multidetector CT imaging of the chest was performed using the standard protocol during bolus administration of intravenous contrast. Multiplanar CT image reconstructions and MIPs were obtained to evaluate the vascular anatomy. Multidetector CT  imaging of the abdomen and pelvis was performed using the standard protocol during bolus administration of intravenous contrast. CONTRAST:  126m ISOVUE-370 IOPAMIDOL (ISOVUE-370) INJECTION 76% COMPARISON:  06/01/2018 abdominal CT FINDINGS: CTA CHEST FINDINGS Cardiovascular: Large volume of acute pulmonary embolism seen branching into the right-sided lobar vessels, occlusive to nearly occlusive, and seen within multiple left lower lobe segmental branches. Abnormal RV to LV ratio of 1.26. No pericardial effusion. No acute systemic arterial finding. Mediastinum/Nodes: Negative for adenopathy. Low-density, likely mucinous material projects through the esophageal hiatus. Lungs/Pleura: No acute finding.  No edema or infarct Musculoskeletal: Remote bilateral rib fractures. Remote bilateral clavicle fracture. No acute osseous finding Review of the MIP images confirms the above findings. Critical Value/emergent results were called by telephone at the time of interpretation on 06/09/2018 at 4:15 pm to Dr. MCharlesetta Shanks, who verbally acknowledged these results. CT ABDOMEN and PELVIS FINDINGS Hepatobiliary: Subcapsular low-density masses along the posterior right liver, peritoneal metastatic disease.Internal transhepatic biliary catheter from the right. The tube is well positioned. There is similar intrahepatic bile duct dilatation Pancreas: Gastric mass invades the tail of pancreas. Spleen: Absent. Adrenals/Urinary Tract: Negative adrenals. No hydronephrosis or stone. Unremarkable bladder. Stomach/Bowel: History of gastric cancer correlating with low-density thickening and mucosal discontinuity at the level of the body, extending extra serosal. There is known peritoneal metastatic disease with nodularity and low-density collections or mucinous material. Negative for bowel obstruction Vascular/Lymphatic:  No visible iliac vein thrombus. Minimal atherosclerotic calcification . Reproductive:Negative Other: No ascites or  pneumoperitoneum. Musculoskeletal: No acute or aggressive finding. Remote right-sided lumbar transverse process fractures Review of the MIP images confirms the above findings. IMPRESSION: 1. Large volume acute pulmonary embolism with multi lobar clot on the right and multi segment clot on the left. Right heart strain (RV/LV Ratio = 1.2) consistent with at least submassive (intermediate risk) PE. The presence of right heart strain has been associated with an increased risk of morbidity and mortality. Please activate Code PE by paging [phone]. 2. Negative for pulmonary edema or lung infarct. 3. Known gastric cancer with peritoneal metastatic disease. No acute finding in the abdomen. 4. Recent trans hepatic biliary drain up sizing. The tube is in good position. Electronically Signed   By: Monte Fantasia M.D.   On: 06/09/2018 16:23   Ct Abdomen Pelvis W Contrast  Result Date: 06/09/2018 CLINICAL DATA:  Shortness of breath EXAM: CT ANGIOGRAPHY CHEST CT ABDOMEN AND PELVIS WITH CONTRAST TECHNIQUE: Multidetector CT imaging of the chest was performed using the standard protocol during bolus administration of intravenous contrast. Multiplanar CT image reconstructions and MIPs were obtained to evaluate the vascular anatomy. Multidetector CT imaging of the abdomen and pelvis was performed using the standard protocol during bolus administration of intravenous contrast. CONTRAST:  19m ISOVUE-370 IOPAMIDOL (ISOVUE-370) INJECTION 76% COMPARISON:  06/01/2018 abdominal CT FINDINGS: CTA CHEST FINDINGS Cardiovascular: Large volume of acute pulmonary embolism seen branching into the right-sided lobar vessels, occlusive to nearly occlusive, and seen within multiple left lower lobe segmental branches. Abnormal RV to LV ratio of 1.26. No pericardial effusion. No acute systemic arterial finding. Mediastinum/Nodes: Negative for adenopathy. Low-density, likely mucinous material projects through the esophageal hiatus. Lungs/Pleura:  No acute finding.  No edema or infarct Musculoskeletal: Remote bilateral rib fractures. Remote bilateral clavicle fracture. No acute osseous finding Review of the MIP images confirms the above findings. Critical Value/emergent results were called by telephone at the time of interpretation on 06/09/2018 at 4:15 pm to Dr. MCharlesetta Shanks, who verbally acknowledged these results. CT ABDOMEN and PELVIS FINDINGS Hepatobiliary: Subcapsular low-density masses along the posterior right liver, peritoneal metastatic disease.Internal transhepatic biliary catheter from the right. The tube is well positioned. There is similar intrahepatic bile duct dilatation Pancreas: Gastric mass invades the tail of pancreas. Spleen: Absent. Adrenals/Urinary Tract: Negative adrenals. No hydronephrosis or stone. Unremarkable bladder. Stomach/Bowel: History of gastric cancer correlating with low-density thickening and mucosal discontinuity at the level of the body, extending extra serosal. There is known peritoneal metastatic disease with nodularity and low-density collections or mucinous material. Negative for bowel obstruction Vascular/Lymphatic: No visible iliac vein thrombus. Minimal atherosclerotic calcification . Reproductive:Negative Other: No ascites or pneumoperitoneum. Musculoskeletal: No acute or aggressive finding. Remote right-sided lumbar transverse process fractures Review of the MIP images confirms the above findings. IMPRESSION: 1. Large volume acute pulmonary embolism with multi lobar clot on the right and multi segment clot on the left. Right heart strain (RV/LV Ratio = 1.2) consistent with at least submassive (intermediate risk) PE. The presence of right heart strain has been associated with an increased risk of morbidity and mortality. Please activate Code PE by paging [phone] 2. Negative for pulmonary edema or lung infarct. 3. Known gastric cancer with peritoneal metastatic disease. No acute finding in the abdomen. 4.  Recent trans hepatic biliary drain up sizing. The tube is in good position. Electronically Signed   By: JMonte FantasiaM.D.   On: 06/09/2018 16:23  Pending Labs Unresulted Labs (From admission, onward)   Start     Ordered   06/10/18 0500  CBC  Tomorrow morning,   R     06/09/18 1616   06/10/18 2518  Basic metabolic panel  Daily,   R    Question:  Specimen collection method  Answer:  Lab=Lab collect   06/09/18 1750   06/10/18 0500  CBC  Daily,   R    Question:  Specimen collection method  Answer:  Lab=Lab collect   06/09/18 1750   06/10/18 0500  Magnesium  Daily,   R    Question:  Specimen collection method  Answer:  Lab=Lab collect   06/09/18 1750   06/09/18 2300  Heparin level (unfractionated)  Once-Timed,   R     06/09/18 1822   06/09/18 1748  HIV antibody (Routine Testing)  Once,   R     06/09/18 1749   06/09/18 1250  Culture, blood (routine x 2)  BLOOD CULTURE X 2,   STAT     06/09/18 1249      Vitals/Pain Today's Vitals   06/09/18 1609 06/09/18 1715 06/09/18 1716 06/09/18 1745  BP:  116/77 116/77 113/81  Pulse:  78 78 90  Resp:  18 (!) 26 (!) 24  SpO2:  100% 100% 96%  Weight:      Height:      PainSc: 1        Isolation Precautions No active isolations  Medications Medications  HYDROmorphone (DILAUDID) injection 1 mg (1 mg Intravenous Given 06/09/18 1500)  iopamidol (ISOVUE-370) 76 % injection (has no administration in time range)  heparin ADULT infusion 100 units/mL (25000 units/241m sodium chloride 0.45%) (1,450 Units/hr Intravenous Bolus 06/09/18 1652)  loratadine (CLARITIN) tablet 10 mg (has no administration in time range)  pantoprazole (PROTONIX) EC tablet 40 mg (has no administration in time range)  Vitamin B 12 LOZG 1,000 mcg (has no administration in time range)  polyethylene glycol (MIRALAX / GLYCOLAX) packet 17 g (has no administration in time range)  fluconazole (DIFLUCAN) tablet 200 mg (has no administration in time range)  fentaNYL (DURAGESIC -  dosed mcg/hr) patch 25 mcg (has no administration in time range)  oxyCODONE (Oxy IR/ROXICODONE) immediate release tablet 5 mg (has no administration in time range)  prochlorperazine (COMPAZINE) tablet 10 mg (has no administration in time range)  0.9 %  sodium chloride infusion (has no administration in time range)  acetaminophen (TYLENOL) tablet 650 mg (has no administration in time range)    Or  acetaminophen (TYLENOL) suppository 650 mg (has no administration in time range)  traZODone (DESYREL) tablet 25 mg (has no administration in time range)  senna-docusate (Senokot-S) tablet 1 tablet (has no administration in time range)  bisacodyl (DULCOLAX) EC tablet 5 mg (has no administration in time range)  dronabinol (MARINOL) capsule 5 mg (has no administration in time range)  multivitamin with minerals tablet 1 tablet (has no administration in time range)  iopamidol (ISOVUE-370) 76 % injection 100 mL (100 mLs Intravenous Contrast Given 06/09/18 1533)  heparin bolus via infusion 4,500 Units (4,500 Units Intravenous Bolus from Bag 06/09/18 1653)    Mobility walks

## 2018-06-09 NOTE — ED Notes (Signed)
Bed: WA17 Expected date:  Expected time:  Means of arrival:  Comments: EMS/shob/cancer

## 2018-06-09 NOTE — H&P (Signed)
History and Physical    [name] OAC:[id number] DOB: [birth year] DOA: 06/09/2018  PCP: Ma Hillock, DO Patient coming from: Home  Chief Complaint: Shortness of breath and hypoxia  HPI: [name] is a 57 y.o. male with medical history significant of adenocarcinoma-gastric, transaminitis/ elevated total bilirubin status post drain placement 6/14, GERD, chronic constipation recently diagnosed of gastric cancer comes back to the hospital with complains of shortness of breath.  Patient was recently here and discharged on June 04, 2018 after being treated for elevated LFTs with percutaneous biliary drain, status post endoscopy with gastric biopsy suggestive of adenocarcinoma and started on FOLFOX on June 19.  After going home he felt slightly short of breath and lower extremity cramping bilaterally, at first he thought his shortness of breath could have been secondary to him feeling very anxious from recent diagnosis but this morning when his caregiver was helping him out with his drain they noted he was hypoxic saturating in mid 80s.  Due to this he was advised to come to the ER but on the way he stopped by the fire department who confirmed that he was hypoxic and brought him here. Patient reports of atypical chest pain upon further questioning especially with deep inspiration and bilateral lower extremity cramping.  He denies any personal or family history of blood clots, recent long distance travel.  No recent changes in medications, trauma or surgery.  In the ER patient was noted to be hypoxic on room air therefore placed on 4 L nasal cannula which improved his oxygenation 200%.  CTA of the chest abdomen and pelvis was done which revealed pulmonary embolism-at least submassive PE with right-sided heart strain.  Case was discussed by the ER provider with on-call intensivist who recommended medical management.  Patient was started on heparin drip and medical team was called to admit the  patient.    Review of Systems: As per HPI otherwise 10 point review of systems negative.  Review of Systems Otherwise negative except as per HPI, including: General: Denies fever, chills, night sweats or unintended weight loss. Resp: Denies cough, wheezing Cardiac: Denies chest pain, palpitations, orthopnea, paroxysmal nocturnal dyspnea. GI: Denies abdominal pain, nausea, vomiting, diarrhea or constipation GU: Denies dysuria, frequency, hesitancy or incontinence MS: Denies muscle aches, joint pain or swelling Neuro: Denies headache, neurologic deficits (focal weakness, numbness, tingling), abnormal gait Psych: Denies anxiety, depression, SI/HI/AVH Skin: Denies new rashes or lesions ID: Denies sick contacts, exotic exposures, travel  Past Medical History:  Diagnosis Date  . Allergy   . Arthritis   . Asplenia   . Basal cell carcinoma   . Erectile dysfunction   . Facial fracture ([place])   . GERD (gastroesophageal reflux disease)   . History of chicken pox   . Melanoma of thigh ([place])    left  . 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 Squamous cell carcinoma, arm    left    Past Surgical History:  Procedure Laterality Date  . BIOPSY  05/28/2018   Procedure: BIOPSY;  Surgeon: Milus Banister, MD;  Location: [facility];  Service: Endoscopy;;  . CHOLECYSTECTOMY  2009  . ESOPHAGOGASTRODUODENOSCOPY (EGD) WITH PROPOFOL Left 05/28/2018   Procedure: ESOPHAGOGASTRODUODENOSCOPY (EGD) WITH PROPOFOL;  Surgeon: Milus Banister, MD;  Location: [facility];  Service: Endoscopy;  Laterality: Left;  . IR CHOLANGIOGRAM EXISTING TUBE  06/01/2018  . IR EXCHANGE BILIARY DRAIN  06/03/2018  . IR IMAGING GUIDED PORT INSERTION  05/31/2018  . IR INT  EXT BILIARY DRAIN WITH CHOLANGIOGRAM  05/28/2018  . MANDIBLE SURGERY    . MELANOMA EXCISION WITH SENTINEL LYMPH NODE BIOPSY  1999  . NASAL SINUS SURGERY    . ORIF ORBITAL FRACTURE    . PALATE SURGERY    . right arm  09/14/08   x2 plates/screws    . SPLENECTOMY, TOTAL  2008   trauma  . zygomtic Left 2015   fracture/plate-screw    SOCIAL HISTORY:  reports that he quit smoking about 6 years ago. He smoked 1.00 pack per day. He has never used smokeless tobacco. He reports that he drinks alcohol. He reports that he does not use drugs.  Allergies  Allergen Reactions  . Cephalexin Other (See Comments)    "Sore throat" REACTION: SORE THROAT  . Other     Bee sting  . Latex Rash  . Tape Rash    FAMILY HISTORY: Family History  Problem Relation Age of Onset  . Arthritis Mother   . Heart disease Mother   . Arthritis Father   . Hearing loss Father   . Pancreatic cancer Maternal Grandfather   . Pancreatic cancer Paternal Grandfather      Prior to Admission medications   Medication Sig Start Date End Date Taking? Authorizing Provider  cetirizine (ZYRTEC) 10 MG tablet Take 10 mg by mouth daily.     Yes [provider]  Cyanocobalamin (VITAMIN B 12 PO) Take 1,000 mcg by mouth daily.   Yes [provider]  esomeprazole (NEXIUM) 20 MG capsule TAKE ONE CAPSULE BY MOUTH EVERY DAY 04/10/15  Yes Dorena Cookey, MD  fentaNYL (DURAGESIC - DOSED MCG/HR) 25 MCG/HR patch Place 1 patch (25 mcg total) onto the skin every 3 (three) days. 06/06/18  Yes Georgette Shell, MD  flintstones complete (FLINTSTONES) 60 MG chewable tablet Chew 1 tablet by mouth daily.   Yes [provider]  fluconazole (DIFLUCAN) 200 MG tablet Take 1 tablet (200 mg total) by mouth daily. 06/04/18  Yes Georgette Shell, MD  hydrocortisone cream 1 % Apply 1 application topically daily as needed for itching.   Yes [provider]  oxyCODONE (OXY IR/ROXICODONE) 5 MG immediate release tablet Take 1 tablet (5 mg total) by mouth every 8 (eight) hours as needed for severe pain or breakthrough pain. 06/07/18  Yes Kuneff, Renee A, DO  polyethylene glycol (MIRALAX / GLYCOLAX) packet Take 17 g by mouth daily.   Yes [provider]   prochlorperazine (COMPAZINE) 10 MG tablet Take 1 tablet (10 mg total) by mouth every 6 (six) hours as needed for nausea or vomiting. 06/07/18  Yes Kuneff, Renee A, DO  sildenafil (REVATIO) 20 MG tablet 1-5 tabs PO prior to sexually activity. 04/20/18  Yes Kuneff, Renee A, DO  dronabinol (MARINOL) 5 MG capsule Take 1 capsule (5 mg total) by mouth 2 (two) times daily before lunch and supper. Patient not taking: Reported on 06/09/2018 06/08/18   Volanda Napoleon, MD    Physical Exam: Vitals:   06/09/18 1430 06/09/18 1515 06/09/18 1519 06/09/18 1716  BP: 97/77 122/80 132/79 116/77  Pulse: 90 86 (!) 106 78  Resp: (!) 38 (!) 26 17 (!) 26  SpO2: 94% 99% 97% 100%  Weight:      Height:          Constitutional: NAD, calm, comfortable, on 3 L nasal cannula Eyes: PERRL, lids and conjunctivae normal ENMT: Mucous membranes are moist. Posterior pharynx clear of any exudate or lesions.Normal dentition.  Neck: normal, supple, no masses, no thyromegaly Respiratory: clear to auscultation bilaterally, no wheezing, no crackles. Normal respiratory effort. No accessory muscle use.  Right chest wall port noted without any signs of active infection Cardiovascular: Regular rate and rhythm, no murmurs / rubs / gallops. No extremity edema. 2+ pedal pulses. No carotid bruits.  Abdomen: no tenderness, no masses palpated. No hepatosplenomegaly. Bowel sounds positive.  Musculoskeletal: no clubbing / cyanosis. No joint deformity upper and lower extremities. Good ROM, no contractures. Normal muscle tone.  Skin: no rashes, lesions, ulcers. No induration Neurologic: CN 2-12 grossly intact. Sensation intact, DTR normal. Strength 5/5 in all 4.  Psychiatric: Normal judgment and insight. Alert and oriented x 3. Normal mood.     Labs on Admission: I have personally reviewed following labs and imaging studies  CBC: Recent Labs  Lab 06/03/18 0500 06/04/18 0620 06/07/18 1428 06/08/18 1504 06/09/18 1254  WBC 12.2* 10.4  11.7* 12.4* 14.3*  NEUTROABS 10.5* 8.9* 9.4* 10.3* 11.9*  HGB 10.7* 11.4* 11.3* 11.1* 11.1*  HCT 32.1* 34.2* 33.5* 33.8* 34.1*  MCV 91.5 92.2 92.4 93.1 94.2  PLT 503* 541* 473.0* 479* 765*   Basic Metabolic Panel: Recent Labs  Lab 06/03/18 0500 06/04/18 0620 06/07/18 1428 06/08/18 1504 06/09/18 1254  NA 135 138 136 137 138  K 3.4* 3.7 4.8 4.0 3.9  CL 99* 97* 94* 95* 97*  CO2 26 28 29 28 28   GLUCOSE 276* 129* 125* 143* 148*  BUN 10 11 13 14 17   CREATININE 0.59* 0.76 0.92 0.91 1.04  CALCIUM 8.2* 9.0 9.9 9.1 9.2   GFR: Estimated Creatinine Clearance: 86.5 mL/min (by C-G formula based on SCr of 1.04 mg/dL). Liver Function Tests: Recent Labs  Lab 06/03/18 0500 06/04/18 0620 06/07/18 1428 06/08/18 1504 06/09/18 1254  AST 159* 245* 157* 152* 144*  ALT 188* 227* 173* 158* 153*  ALKPHOS 467* 465* 534* 522* 522*  BILITOT 9.1* 7.9* 9.7* 8.2* 8.2*  PROT 6.7 7.6 7.5 8.2* 8.2*  ALBUMIN 2.7* 3.0* 4.0 3.2* 3.2*   Recent Labs  Lab 06/09/18 1254  LIPASE 57*   No results for input(s): AMMONIA in the last 168 hours. Coagulation Profile: Recent Labs  Lab 06/09/18 1254  INR 1.40   Cardiac Enzymes: No results for input(s): CKTOTAL, CKMB, CKMBINDEX, TROPONINI in the last 168 hours. BNP (last 3 results) No results for input(s): PROBNP in the last 8760 hours. HbA1C: No results for input(s): HGBA1C in the last 72 hours. CBG: No results for input(s): GLUCAP in the last 168 hours. Lipid Profile: No results for input(s): CHOL, HDL, LDLCALC, TRIG, CHOLHDL, LDLDIRECT in the last 72 hours. Thyroid Function Tests: No results for input(s): TSH, T4TOTAL, FREET4, T3FREE, THYROIDAB in the last 72 hours. Anemia Panel: Recent Labs    06/08/18 1504  FERRITIN 597*  TIBC 314  IRON 23*   Urine analysis:    Component Value Date/Time   COLORURINE AMBER (A) 06/09/2018 1506   APPEARANCEUR CLEAR 06/09/2018 1506   LABSPEC 1.024 06/09/2018 1506   PHURINE 5.0 06/09/2018 1506   GLUCOSEU  NEGATIVE 06/09/2018 1506   HGBUR NEGATIVE 06/09/2018 1506   HGBUR negative 09/21/2009 1223   BILIRUBINUR SMALL (A) 06/09/2018 1506   BILIRUBINUR n 04/03/2015 1041   KETONESUR NEGATIVE 06/09/2018 1506   PROTEINUR NEGATIVE 06/09/2018 1506   UROBILINOGEN 0.2 04/03/2015 1041   UROBILINOGEN 0.2 09/21/2009 1223   NITRITE NEGATIVE 06/09/2018 1506   LEUKOCYTESUR NEGATIVE 06/09/2018 1506   Sepsis Labs: !!!!!!!!!!!!!!!!!!!!!!!!!!!!!!!!!!!!!!!!!!!! @LABRCNTIP (procalcitonin:4,lacticidven:4) )No results found for  this or any previous visit (from the past 240 hour(s)).   Radiological Exams on Admission: Dg Chest 2 View  Result Date: 06/09/2018 CLINICAL DATA:  Shortness of breath. EXAM: CHEST - 2 VIEW COMPARISON:  Radiographs of August 25, 2007. CT scan of May 27, 2018. FINDINGS: Stable cardiomediastinal silhouette. No pneumothorax or pleural effusion is noted. Multiple old bilateral rib fractures are noted. Old bilateral clavicular fractures are noted. Right internal jugular Port-A-Cath is noted with distal tip in expected position of cavoatrial junction. IMPRESSION: Multiple old bilateral rib and clavicular fractures. No acute intracranial abnormality seen. Electronically Signed   By: Marijo Conception, M.D.   On: 06/09/2018 14:06   Ct Angio Chest Pe W/cm &/or Wo Cm  Result Date: 06/09/2018 CLINICAL DATA:  Shortness of breath EXAM: CT ANGIOGRAPHY CHEST CT ABDOMEN AND PELVIS WITH CONTRAST TECHNIQUE: Multidetector CT imaging of the chest was performed using the standard protocol during bolus administration of intravenous contrast. Multiplanar CT image reconstructions and MIPs were obtained to evaluate the vascular anatomy. Multidetector CT imaging of the abdomen and pelvis was performed using the standard protocol during bolus administration of intravenous contrast. CONTRAST:  165mL ISOVUE-370 IOPAMIDOL (ISOVUE-370) INJECTION 76% COMPARISON:  06/01/2018 abdominal CT FINDINGS: CTA CHEST FINDINGS  Cardiovascular: Large volume of acute pulmonary embolism seen branching into the right-sided lobar vessels, occlusive to nearly occlusive, and seen within multiple left lower lobe segmental branches. Abnormal RV to LV ratio of 1.26. No pericardial effusion. No acute systemic arterial finding. Mediastinum/Nodes: Negative for adenopathy. Low-density, likely mucinous material projects through the esophageal hiatus. Lungs/Pleura: No acute finding.  No edema or infarct Musculoskeletal: Remote bilateral rib fractures. Remote bilateral clavicle fracture. No acute osseous finding Review of the MIP images confirms the above findings. Critical Value/emergent results were called by telephone at the time of interpretation on 06/09/2018 at 4:15 pm to Dr. Charlesetta Shanks , who verbally acknowledged these results. CT ABDOMEN and PELVIS FINDINGS Hepatobiliary: Subcapsular low-density masses along the posterior right liver, peritoneal metastatic disease.Internal transhepatic biliary catheter from the right. The tube is well positioned. There is similar intrahepatic bile duct dilatation Pancreas: Gastric mass invades the tail of pancreas. Spleen: Absent. Adrenals/Urinary Tract: Negative adrenals. No hydronephrosis or stone. Unremarkable bladder. Stomach/Bowel: History of gastric cancer correlating with low-density thickening and mucosal discontinuity at the level of the body, extending extra serosal. There is known peritoneal metastatic disease with nodularity and low-density collections or mucinous material. Negative for bowel obstruction Vascular/Lymphatic: No visible iliac vein thrombus. Minimal atherosclerotic calcification . Reproductive:Negative Other: No ascites or pneumoperitoneum. Musculoskeletal: No acute or aggressive finding. Remote right-sided lumbar transverse process fractures Review of the MIP images confirms the above findings. IMPRESSION: 1. Large volume acute pulmonary embolism with multi lobar clot on the right and  multi segment clot on the left. Right heart strain (RV/LV Ratio = 1.2) consistent with at least submassive (intermediate risk) PE. The presence of right heart strain has been associated with an increased risk of morbidity and mortality. Please activate Code PE by paging [phone]. 2. Negative for pulmonary edema or lung infarct. 3. Known gastric cancer with peritoneal metastatic disease. No acute finding in the abdomen. 4. Recent trans hepatic biliary drain up sizing. The tube is in good position. Electronically Signed   By: Monte Fantasia M.D.   On: 06/09/2018 16:23   Ct Abdomen Pelvis W Contrast  Result Date: 06/09/2018 CLINICAL DATA:  Shortness of breath EXAM: CT ANGIOGRAPHY CHEST CT ABDOMEN AND PELVIS WITH CONTRAST TECHNIQUE: Multidetector CT  imaging of the chest was performed using the standard protocol during bolus administration of intravenous contrast. Multiplanar CT image reconstructions and MIPs were obtained to evaluate the vascular anatomy. Multidetector CT imaging of the abdomen and pelvis was performed using the standard protocol during bolus administration of intravenous contrast. CONTRAST:  134mL ISOVUE-370 IOPAMIDOL (ISOVUE-370) INJECTION 76% COMPARISON:  06/01/2018 abdominal CT FINDINGS: CTA CHEST FINDINGS Cardiovascular: Large volume of acute pulmonary embolism seen branching into the right-sided lobar vessels, occlusive to nearly occlusive, and seen within multiple left lower lobe segmental branches. Abnormal RV to LV ratio of 1.26. No pericardial effusion. No acute systemic arterial finding. Mediastinum/Nodes: Negative for adenopathy. Low-density, likely mucinous material projects through the esophageal hiatus. Lungs/Pleura: No acute finding.  No edema or infarct Musculoskeletal: Remote bilateral rib fractures. Remote bilateral clavicle fracture. No acute osseous finding Review of the MIP images confirms the above findings. Critical Value/emergent results were called by telephone at the  time of interpretation on 06/09/2018 at 4:15 pm to Dr. Charlesetta Shanks , who verbally acknowledged these results. CT ABDOMEN and PELVIS FINDINGS Hepatobiliary: Subcapsular low-density masses along the posterior right liver, peritoneal metastatic disease.Internal transhepatic biliary catheter from the right. The tube is well positioned. There is similar intrahepatic bile duct dilatation Pancreas: Gastric mass invades the tail of pancreas. Spleen: Absent. Adrenals/Urinary Tract: Negative adrenals. No hydronephrosis or stone. Unremarkable bladder. Stomach/Bowel: History of gastric cancer correlating with low-density thickening and mucosal discontinuity at the level of the body, extending extra serosal. There is known peritoneal metastatic disease with nodularity and low-density collections or mucinous material. Negative for bowel obstruction Vascular/Lymphatic: No visible iliac vein thrombus. Minimal atherosclerotic calcification . Reproductive:Negative Other: No ascites or pneumoperitoneum. Musculoskeletal: No acute or aggressive finding. Remote right-sided lumbar transverse process fractures Review of the MIP images confirms the above findings. IMPRESSION: 1. Large volume acute pulmonary embolism with multi lobar clot on the right and multi segment clot on the left. Right heart strain (RV/LV Ratio = 1.2) consistent with at least submassive (intermediate risk) PE. The presence of right heart strain has been associated with an increased risk of morbidity and mortality. Please activate Code PE by paging [phone]. 2. Negative for pulmonary edema or lung infarct. 3. Known gastric cancer with peritoneal metastatic disease. No acute finding in the abdomen. 4. Recent trans hepatic biliary drain up sizing. The tube is in good position. Electronically Signed   By: Monte Fantasia M.D.   On: 06/09/2018 16:23     All images have been reviewed by me personally.  EKG: Independently reviewed.  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 -CTA of the chest shows pulmonary embolism with right heart strain - Patient has been started on heparin drip - We will order lower extremity Dopplers -Check echocardiogram - Case was discussed by the ER provider with on-call pulmonary/intensivist who recommends routine management -Provide supportive care, pain control  Gastro adenocarcinoma Transaminitis with mild ductal dilation/elevated total bilirubin -Patient is status post biliary drain placement followed by interventional radiology.  This was placed on 05/28/18 -Continue to trend LFTs -Management per outpatient  oncology  History of GERD -Continue PPI   DVT prophylaxis: In Hep Drip  Code Status: Full  Family Communication: family at bedside  Disposition Plan:  TBD Consults called: Oncology, Dr Marin Olp added to Care team Admission status: Inpatient telemetry admission   Time Spent: 65 minutes.  >50% of the time was devoted to discussing the patients care, assessment, plan and disposition with other care givers along with counseling the patient about the risks and benefits of treatment.   Please Note: This patient record was dictated using Editor, commissioning. Chart creation errors have been sought, but may not always have been located. Such creation errors do not reflect on the Standard of Medical Care.   Garrin Kirwan Arsenio Loader MD [facility] Pager [phone]  If 7PM-7AM, please contact night-coverage [email or web address] Password  [facility]  06/09/2018, 5:52 PM

## 2018-06-09 NOTE — ED Triage Notes (Signed)
EMS states  Pts [facility] nurse come out to flush drain from Liver. Noted to have 02 sats in 38s, father took him to fire dept, sats 80's started on 4 L 02 sat 98. 118/[id number]

## 2018-06-09 NOTE — ED Provider Notes (Signed)
[facility] DEPT Provider Note   CSN: [id number] Arrival date & time: 06/09/18  1213     History   Chief Complaint Chief Complaint  Patient presents with  . Shortness of Breath    HPI [name] is a 57 y.o. male.  HPI Patient has had increasing shortness of breath for couple days.  He denies he had cough or fever.  He reports he has no chest pain.  He reports he has chronic abdominal pain that encompasses all of his abdomen and back due to his liver and pancreatic cancer.  Has a hepatic percutaneous drain that is usually flushed by a nurse at his home.  The nurse came to flush it and noted that his oxygen saturation was 70% at home.  The patient reports that the machine did seem to be working right and she could not get it to work on herself and he felt that it was unreliable.  He stopped at the fire department and they identified his oxygen saturations to be in the low 80s and he was started on 4 L of oxygen.  He reports he does feel better on the oxygen.  He reports he is having his chronic abdominal pain that has been present for months.  He reports at baseline it is quite severe.  He reports his abdomen is always full and distended feeling.  The degree of shortness of breath he is experiencing however has not been typical.  He denies swelling in his legs or pain behind his calves. Past Medical History:  Diagnosis Date  . Allergy   . Arthritis   . Asplenia   . Basal cell carcinoma   . Erectile dysfunction   . Facial fracture ([place])   . GERD (gastroesophageal reflux disease)   . History of chicken pox   . Melanoma of thigh ([place])    left  . 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 Squamous cell carcinoma, arm    left    Patient Active Problem List   Diagnosis Date Noted  . Hyperbilirubinemia   . Dilation of biliary tract   . Obstructive jaundice due to malignant neoplasm ([place])   . 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 Elevated LFTs   . Carcinoma ([place]) 05/27/2018  . Abnormal CT scan, gastrointestinal tract   . Elevated liver enzymes   . Gastric mass 05/24/2018  . Pancreatic mass 05/24/2018  . Generalized abdominal pain 05/24/2018  . History of melanoma 01/15/2018  . Asplenia   . Melanoma ([place]) 04/09/2016  . Overweight (BMI 25.0-29.9) 04/09/2016  . Goals of care, counseling/discussion 03/30/2014  . ESOPHAGITIS, REFLUX 10/23/2009  . 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 ERECTILE DYSFUNCTION, MILD 05/19/2008  . Squamous cell cancer of skin of forearm 05/10/2008    Past Surgical History:  Procedure Laterality Date  . BIOPSY  05/28/2018   Procedure: BIOPSY;  Surgeon: Milus Banister, MD;  Location: [facility];  Service: Endoscopy;;  . CHOLECYSTECTOMY  2009  . ESOPHAGOGASTRODUODENOSCOPY (EGD) WITH PROPOFOL Left 05/28/2018   Procedure: ESOPHAGOGASTRODUODENOSCOPY (EGD) WITH PROPOFOL;  Surgeon: Milus Banister, MD;  Location: [facility];  Service: Endoscopy;  Laterality: Left;  . IR CHOLANGIOGRAM EXISTING TUBE  06/01/2018  . IR EXCHANGE BILIARY DRAIN  06/03/2018  . IR IMAGING GUIDED PORT INSERTION  05/31/2018  . IR INT EXT BILIARY DRAIN WITH CHOLANGIOGRAM  05/28/2018  . MANDIBLE SURGERY    . MELANOMA  EXCISION WITH SENTINEL LYMPH NODE BIOPSY  1999  . NASAL SINUS SURGERY    . ORIF ORBITAL FRACTURE    . PALATE SURGERY    . right arm  09/14/08   x2 plates/screws  . SPLENECTOMY, TOTAL  2008   trauma  . zygomtic Left 2015   fracture/plate-screw        Home Medications    Prior to Admission medications   Medication Sig Start Date End Date Taking? Authorizing Provider  cetirizine (ZYRTEC) 10 MG tablet Take 10 mg by mouth daily.     Yes [provider]  Cyanocobalamin (VITAMIN B 12 PO) Take 1,000 mcg by mouth daily.   Yes [provider]  esomeprazole (NEXIUM) 20 MG capsule TAKE ONE CAPSULE BY MOUTH EVERY DAY 04/10/15  Yes Dorena Cookey, MD  fentaNYL (DURAGESIC - DOSED MCG/HR) 25 MCG/HR patch Place 1 patch (25 mcg total) onto the skin every 3 (three) days. 06/06/18  Yes Georgette Shell, MD  flintstones complete (FLINTSTONES) 60 MG chewable tablet Chew 1 tablet by mouth daily.   Yes [provider]  fluconazole (DIFLUCAN) 200 MG tablet Take 1 tablet (200 mg total) by mouth daily. 06/04/18  Yes Georgette Shell, MD  hydrocortisone cream 1 % Apply 1 application topically daily as needed for itching.   Yes [provider]  oxyCODONE (OXY IR/ROXICODONE) 5 MG immediate release tablet Take 1 tablet (5 mg total) by mouth every 8 (eight) hours as needed for severe pain or breakthrough pain. 06/07/18  Yes Kuneff, Renee A, DO  polyethylene glycol (MIRALAX / GLYCOLAX) packet Take 17 g by mouth daily.   Yes [provider]  prochlorperazine (COMPAZINE) 10 MG tablet Take 1 tablet (10 mg total) by mouth every 6 (six) hours as needed for nausea or vomiting. 06/07/18  Yes Kuneff, Renee A, DO  sildenafil (REVATIO) 20 MG tablet 1-5 tabs PO prior to sexually activity. 04/20/18  Yes Kuneff, Renee A, DO  dronabinol (MARINOL) 5 MG capsule Take 1 capsule (5 mg total) by mouth 2 (two) times daily before lunch and supper. Patient not taking: Reported on 06/09/2018 06/08/18   Volanda Napoleon, MD    Family History Family History  Problem Relation Age of Onset  . Arthritis Mother   . Heart disease Mother   . Arthritis Father   . Hearing loss Father   . Pancreatic cancer Maternal Grandfather   . Pancreatic cancer Paternal Grandfather     Social History Social History   Tobacco Use  . Smoking status: Former Smoker    Packs/day: 1.00    Last attempt to quit: 12/07/2011    Years since quitting: 6.5  . Smokeless tobacco: Never Used  Substance Use Topics  . Alcohol use: Yes  . Drug use: No     Allergies   Cephalexin; Other; Latex; and Tape   Review of Systems Review of Systems 10 Systems reviewed and  are negative for acute change except as noted in the HPI.  Physical Exam Updated Vital Signs BP 132/79 (BP Location: Right Arm)   Pulse (!) 106   Resp 17   Ht 5\' 11"  (1.803 m)   Wt 85.7 kg (189 lb)   SpO2 97%   BMI 26.36 kg/m   Physical Exam  Constitutional: He is oriented to person, place, and time. He appears well-developed and well-nourished.  Patient appears to be uncomfortable and in pain.  He is alert and appropriate.  Tachypnea but no severe respiratory distress.  Color is good.  HENT:  Head: Normocephalic and atraumatic.  Mouth/Throat: Oropharynx is clear and moist.  Eyes: EOM are normal. Scleral icterus is present.  Neck: Neck supple.  Cardiovascular: Normal rate, regular rhythm, normal heart sounds and intact distal pulses.  Pulmonary/Chest: Effort normal and breath sounds normal.  Sounds are slightly soft bilaterally but symmetric.  No gross rail or rhonchi.  Abdominal: Soft. Bowel sounds are normal. He exhibits distension. There is tenderness.  Patient's abdomen is diffusely and moderately distended.  He identifies this is chronic.  He has a percutaneous biliary drain that has bilious material in the bag.  No erythema of the abdominal wall.  Diffusely tender without peritoneal signs.  Musculoskeletal: Normal range of motion. He exhibits no edema or tenderness.  Neurological: He is alert and oriented to person, place, and time. He has normal strength. Coordination normal. GCS eye subscore is 4. GCS verbal subscore is 5. GCS motor subscore is 6.  Skin: Skin is warm, dry and intact.  Psychiatric: He has a normal mood and affect.     ED Treatments / Results  Labs (all labs ordered are listed, but only abnormal results are displayed) Labs Reviewed  COMPREHENSIVE METABOLIC PANEL - Abnormal; Notable for the following components:      Result Value   Chloride 97 (*)    Glucose, Bld 148 (*)    Total Protein 8.2 (*)    Albumin 3.2 (*)    AST 144 (*)    ALT 153 (*)     Alkaline Phosphatase 522 (*)    Total Bilirubin 8.2 (*)    All other components within normal limits  LIPASE, BLOOD - Abnormal; Notable for the following components:   Lipase 57 (*)    All other components within normal limits  BRAIN NATRIURETIC PEPTIDE - Abnormal; Notable for the following components:   B Natriuretic Peptide 465.3 (*)    All other components within normal limits  CBC WITH DIFFERENTIAL/PLATELET - Abnormal; Notable for the following components:   WBC 14.3 (*)    RBC 3.62 (*)    Hemoglobin 11.1 (*)    HCT 34.1 (*)    Platelets 530 (*)    Neutro Abs 11.9 (*)    Monocytes Absolute 1.4 (*)    All other components within normal limits  PROTIME-INR - Abnormal; Notable for the following components:   Prothrombin Time 17.0 (*)    All other components within normal limits  URINALYSIS, ROUTINE W REFLEX MICROSCOPIC - Abnormal; Notable for the following components:   Color, Urine AMBER (*)    Bilirubin Urine SMALL (*)    All other components within normal limits  I-STAT TROPONIN, ED - Abnormal; Notable for the following components:   Troponin i, poc 0.15 (*)    All other components within normal limits  CULTURE, BLOOD (ROUTINE X 2)  CULTURE, BLOOD (ROUTINE X 2)  APTT  I-STAT CG4 LACTIC ACID, ED    EKG EKG Interpretation  Date/Time:  Wednesday June 09 2018 12:26:07 EDT Ventricular Rate:  85 PR Interval:    QRS Duration: 84 QT Interval:  439 QTC Calculation: 523 R Axis:   82 Text Interpretation:  Sinus rhythm Borderline repolarization abnormality Prolonged QT interval agree. no sig change from previous Confirmed by Charlesetta Shanks [phone]) on 06/09/2018 4:55:42 PM   Radiology Dg Chest 2 View  Result Date: 06/09/2018 CLINICAL DATA:  Shortness of breath. EXAM: CHEST - 2 VIEW COMPARISON:  Radiographs of August 25, 2007. CT scan of  May 27, 2018. FINDINGS: Stable cardiomediastinal silhouette. No pneumothorax or pleural effusion is noted. Multiple old bilateral rib  fractures are noted. Old bilateral clavicular fractures are noted. Right internal jugular Port-A-Cath is noted with distal tip in expected position of cavoatrial junction. IMPRESSION: Multiple old bilateral rib and clavicular fractures. No acute intracranial abnormality seen. Electronically Signed   By: Marijo Conception, M.D.   On: 06/09/2018 14:06   Ct Angio Chest Pe W/cm &/or Wo Cm  Result Date: 06/09/2018 CLINICAL DATA:  Shortness of breath EXAM: CT ANGIOGRAPHY CHEST CT ABDOMEN AND PELVIS WITH CONTRAST TECHNIQUE: Multidetector CT imaging of the chest was performed using the standard protocol during bolus administration of intravenous contrast. Multiplanar CT image reconstructions and MIPs were obtained to evaluate the vascular anatomy. Multidetector CT imaging of the abdomen and pelvis was performed using the standard protocol during bolus administration of intravenous contrast. CONTRAST:  143mL ISOVUE-370 IOPAMIDOL (ISOVUE-370) INJECTION 76% COMPARISON:  06/01/2018 abdominal CT FINDINGS: CTA CHEST FINDINGS Cardiovascular: Large volume of acute pulmonary embolism seen branching into the right-sided lobar vessels, occlusive to nearly occlusive, and seen within multiple left lower lobe segmental branches. Abnormal RV to LV ratio of 1.26. No pericardial effusion. No acute systemic arterial finding. Mediastinum/Nodes: Negative for adenopathy. Low-density, likely mucinous material projects through the esophageal hiatus. Lungs/Pleura: No acute finding.  No edema or infarct Musculoskeletal: Remote bilateral rib fractures. Remote bilateral clavicle fracture. No acute osseous finding Review of the MIP images confirms the above findings. Critical Value/emergent results were called by telephone at the time of interpretation on 06/09/2018 at 4:15 pm to Dr. Charlesetta Shanks , who verbally acknowledged these results. CT ABDOMEN and PELVIS FINDINGS Hepatobiliary: Subcapsular low-density masses along the posterior right liver,  peritoneal metastatic disease.Internal transhepatic biliary catheter from the right. The tube is well positioned. There is similar intrahepatic bile duct dilatation Pancreas: Gastric mass invades the tail of pancreas. Spleen: Absent. Adrenals/Urinary Tract: Negative adrenals. No hydronephrosis or stone. Unremarkable bladder. Stomach/Bowel: History of gastric cancer correlating with low-density thickening and mucosal discontinuity at the level of the body, extending extra serosal. There is known peritoneal metastatic disease with nodularity and low-density collections or mucinous material. Negative for bowel obstruction Vascular/Lymphatic: No visible iliac vein thrombus. Minimal atherosclerotic calcification . Reproductive:Negative Other: No ascites or pneumoperitoneum. Musculoskeletal: No acute or aggressive finding. Remote right-sided lumbar transverse process fractures Review of the MIP images confirms the above findings. IMPRESSION: 1. Large volume acute pulmonary embolism with multi lobar clot on the right and multi segment clot on the left. Right heart strain (RV/LV Ratio = 1.2) consistent with at least submassive (intermediate risk) PE. The presence of right heart strain has been associated with an increased risk of morbidity and mortality. Please activate Code PE by paging [phone]. 2. Negative for pulmonary edema or lung infarct. 3. Known gastric cancer with peritoneal metastatic disease. No acute finding in the abdomen. 4. Recent trans hepatic biliary drain up sizing. The tube is in good position. Electronically Signed   By: Monte Fantasia M.D.   On: 06/09/2018 16:23   Ct Abdomen Pelvis W Contrast  Result Date: 06/09/2018 CLINICAL DATA:  Shortness of breath EXAM: CT ANGIOGRAPHY CHEST CT ABDOMEN AND PELVIS WITH CONTRAST TECHNIQUE: Multidetector CT imaging of the chest was performed using the standard protocol during bolus administration of intravenous contrast. Multiplanar CT image reconstructions  and MIPs were obtained to evaluate the vascular anatomy. Multidetector CT imaging of the abdomen and pelvis was performed using the standard protocol during  bolus administration of intravenous contrast. CONTRAST:  144mL ISOVUE-370 IOPAMIDOL (ISOVUE-370) INJECTION 76% COMPARISON:  06/01/2018 abdominal CT FINDINGS: CTA CHEST FINDINGS Cardiovascular: Large volume of acute pulmonary embolism seen branching into the right-sided lobar vessels, occlusive to nearly occlusive, and seen within multiple left lower lobe segmental branches. Abnormal RV to LV ratio of 1.26. No pericardial effusion. No acute systemic arterial finding. Mediastinum/Nodes: Negative for adenopathy. Low-density, likely mucinous material projects through the esophageal hiatus. Lungs/Pleura: No acute finding.  No edema or infarct Musculoskeletal: Remote bilateral rib fractures. Remote bilateral clavicle fracture. No acute osseous finding Review of the MIP images confirms the above findings. Critical Value/emergent results were called by telephone at the time of interpretation on 06/09/2018 at 4:15 pm to Dr. Charlesetta Shanks , who verbally acknowledged these results. CT ABDOMEN and PELVIS FINDINGS Hepatobiliary: Subcapsular low-density masses along the posterior right liver, peritoneal metastatic disease.Internal transhepatic biliary catheter from the right. The tube is well positioned. There is similar intrahepatic bile duct dilatation Pancreas: Gastric mass invades the tail of pancreas. Spleen: Absent. Adrenals/Urinary Tract: Negative adrenals. No hydronephrosis or stone. Unremarkable bladder. Stomach/Bowel: History of gastric cancer correlating with low-density thickening and mucosal discontinuity at the level of the body, extending extra serosal. There is known peritoneal metastatic disease with nodularity and low-density collections or mucinous material. Negative for bowel obstruction Vascular/Lymphatic: No visible iliac vein thrombus. Minimal  atherosclerotic calcification . Reproductive:Negative Other: No ascites or pneumoperitoneum. Musculoskeletal: No acute or aggressive finding. Remote right-sided lumbar transverse process fractures Review of the MIP images confirms the above findings. IMPRESSION: 1. Large volume acute pulmonary embolism with multi lobar clot on the right and multi segment clot on the left. Right heart strain (RV/LV Ratio = 1.2) consistent with at least submassive (intermediate risk) PE. The presence of right heart strain has been associated with an increased risk of morbidity and mortality. Please activate Code PE by paging [phone]. 2. Negative for pulmonary edema or lung infarct. 3. Known gastric cancer with peritoneal metastatic disease. No acute finding in the abdomen. 4. Recent trans hepatic biliary drain up sizing. The tube is in good position. Electronically Signed   By: Monte Fantasia M.D.   On: 06/09/2018 16:23    Procedures Procedures (including critical care time) CRITICAL CARE Performed by: Charlesetta Shanks   Total critical care time: 60 minutes  Critical care time was exclusive of separately billable procedures and treating other patients.  Critical care was necessary to treat or prevent imminent or life-threatening deterioration.  Critical care was time spent personally by me on the following activities: development of treatment plan with patient and/or surrogate as well as nursing, discussions with consultants, evaluation of patient's response to treatment, examination of patient, obtaining history from patient or surrogate, ordering and performing treatments and interventions, ordering and review of laboratory studies, ordering and review of radiographic studies, pulse oximetry and re-evaluation of patient's condition. Medications Ordered in ED Medications  HYDROmorphone (DILAUDID) injection 1 mg (1 mg Intravenous Given 06/09/18 1500)  iopamidol (ISOVUE-370) 76 % injection (has no administration  in time range)  heparin ADULT infusion 100 units/mL (25000 units/221mL sodium chloride 0.45%) (1,450 Units/hr Intravenous Bolus 06/09/18 1652)  iopamidol (ISOVUE-370) 76 % injection 100 mL (100 mLs Intravenous Contrast Given 06/09/18 1533)  heparin bolus via infusion 4,500 Units (4,500 Units Intravenous Bolus from Bag 06/09/18 1653)     Initial Impression / Assessment and Plan / ED Course  I have reviewed the triage vital signs and the nursing notes.  Pertinent labs &  imaging results that were available during my care of the patient were reviewed by me and considered in my medical decision making (see chart for details).    Consult: (16: 31) reviewed with Dr. Halford Chessman.  Reports patient would not be a candidate for interventional management.  High risk for bleeding.  At this time recommends heparin therapy and admission to hospitalist service on stepdown or ICU.  Patient's blood pressure have remained stable.  On nasal cannula oxygen saturation is remained to the high 90s. Consult: Tried hospitalist for admission. Final Clinical Impressions(s) / ED Diagnoses   Final diagnoses:  Other acute pulmonary embolism with acute cor pulmonale ([place])  Metastatic cancer ([place])   Patient presents with shortness of breath over several days.  Massive PE is identified.  Fortunately, patient has stable heart rate and blood pressure.  He is requiring 4 L nasal cannula oxygen.  Mental Status is clear.  With pain control with Dilaudid for his chronic cancer and ascites related abdominal pain, patient is comfortable and relaxed on recheck.  He reports he would like something to eat and drink.  He does not have any increased respiratory distress.  Mental status is clear.  Patient will be admitted.  Heparin has been ordered.  ED Discharge Orders    None       Charlesetta Shanks, MD 06/09/18 1659

## 2018-06-09 NOTE — Progress Notes (Signed)
ANTICOAGULATION CONSULT NOTE - Initial Consult  Pharmacy Consult for heparin drip Indication: pulmonary embolus  Allergies  Allergen Reactions  . Cephalexin Other (See Comments)    "Sore throat" REACTION: SORE THROAT  . Other     Bee sting  . Latex Rash  . Tape Rash    Patient Measurements: Height: '5\' 11"'$  (180.3 cm) Weight: 189 lb (85.7 kg) IBW/kg (Calculated) : 75.3 Heparin Dosing Weight: 86 kg  Vital Signs:  BP: 132/79 (06/26 1519) Pulse Rate: 106 (06/26 1519)  Labs: Recent Labs    06/07/18 1428 06/08/18 1504 06/09/18 1254  HGB 11.3* 11.1* 11.1*  HCT 33.5* 33.8* 34.1*  PLT 473.0* 479* 530*  LABPROT  --   --  17.0*  INR  --   --  1.40  CREATININE 0.92 0.91 1.04    Estimated Creatinine Clearance: 86.5 mL/min (by C-G formula based on SCr of 1.04 mg/dL).   Medical History: Past Medical History:  Diagnosis Date  . Allergy   . Arthritis   . Asplenia   . Basal cell carcinoma   . Erectile dysfunction   . Facial fracture ([place])   . GERD (gastroesophageal reflux disease)   . History of chicken pox   . Melanoma of thigh ([place])    left  . 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 Squamous cell carcinoma, arm    left   Assessment: Pharmacy consulted to dose and monitor heparin in this 57 year old male diagnosed with PE. 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 Per completed med rec and insurance claims, patient is not on any anticoagulation prior to admission.  Baseline labs have been ordered.  Today, 06/09/18  Hgb 11.1, Plt 530, INR 1.4  Goal of Therapy:  Heparin level 0.3-0.7 units/ml Monitor platelets by anticoagulation protocol: Yes   Plan:   Heparin bolus 4500 units x1  Initiate heparin infusion of 1450 units/hr  Will check HL in 6 hours  CBC and HL to be checked at least daily  Monitor patient for any signs/symptoms of bleeding  Lenis Noon,  PharmD, BCPS Clinical Pharmacist 06/09/2018,4:04 PM

## 2018-06-09 NOTE — Telephone Encounter (Signed)
RN from [place] with patient at his home. Patient is satting between 70-74% on room air. His pulse is 100. He c/o pain and feeling some minor shortness of breath.   Patient was seen in our office yesterday and had an O2 sat of 100%. Dr Marin Olp would like the patient to go to the ER for urgent work up due to acute change in saturations.   [facility] RN and patient (on speaker) aware of recommendation.

## 2018-06-10 ENCOUNTER — Inpatient Hospital Stay (HOSPITAL_COMMUNITY): Payer: BLUE CROSS/BLUE SHIELD

## 2018-06-10 ENCOUNTER — Encounter (HOSPITAL_COMMUNITY): Payer: Self-pay

## 2018-06-10 DIAGNOSIS — Z87891 Personal history of nicotine dependence: Secondary | ICD-10-CM

## 2018-06-10 DIAGNOSIS — Z91030 Bee allergy status: Secondary | ICD-10-CM

## 2018-06-10 DIAGNOSIS — F528 Other sexual dysfunction not due to a substance or known physiological condition: Secondary | ICD-10-CM

## 2018-06-10 DIAGNOSIS — I2699 Other pulmonary embolism without acute cor pulmonale: Secondary | ICD-10-CM

## 2018-06-10 DIAGNOSIS — I361 Nonrheumatic tricuspid (valve) insufficiency: Secondary | ICD-10-CM

## 2018-06-10 DIAGNOSIS — Z881 Allergy status to other antibiotic agents status: Secondary | ICD-10-CM

## 2018-06-10 DIAGNOSIS — Z91040 Latex allergy status: Secondary | ICD-10-CM

## 2018-06-10 DIAGNOSIS — C169 Malignant neoplasm of stomach, unspecified: Secondary | ICD-10-CM

## 2018-06-10 DIAGNOSIS — R609 Edema, unspecified: Secondary | ICD-10-CM

## 2018-06-10 DIAGNOSIS — M7989 Other specified soft tissue disorders: Secondary | ICD-10-CM

## 2018-06-10 DIAGNOSIS — Z800 Family history of malignant neoplasm of digestive organs: Secondary | ICD-10-CM

## 2018-06-10 DIAGNOSIS — C799 Secondary malignant neoplasm of unspecified site: Secondary | ICD-10-CM

## 2018-06-10 LAB — BASIC METABOLIC PANEL
ANION GAP: 8 (ref 5–15)
BUN: 15 mg/dL (ref 6–20)
CHLORIDE: 101 mmol/L (ref 98–111)
CO2: 31 mmol/L (ref 22–32)
Calcium: 8.8 mg/dL — ABNORMAL LOW (ref 8.9–10.3)
Creatinine, Ser: 0.87 mg/dL (ref 0.61–1.24)
GFR calc non Af Amer: >60 mL/min (ref >60)
Glucose, Bld: 112 mg/dL — ABNORMAL HIGH (ref 70–99)
POTASSIUM: 4.2 mmol/L (ref 3.5–5.1)
SODIUM: 140 mmol/L (ref 135–145)

## 2018-06-10 LAB — MAGNESIUM: MAGNESIUM: 2.1 mg/dL (ref 1.7–2.4)

## 2018-06-10 LAB — ECHOCARDIOGRAM COMPLETE
Height: 71 in
Weight: 3004.8 oz

## 2018-06-10 LAB — CBC
HCT: 30.4 % — ABNORMAL LOW (ref 39.0–52.0)
Hemoglobin: 9.7 g/dL — ABNORMAL LOW (ref 13.0–17.0)
MCH: 30.2 pg (ref 26.0–34.0)
MCHC: 31.9 g/dL (ref 30.0–36.0)
MCV: 94.7 fL (ref 78.0–100.0)
Platelets: 439 10*3/uL — ABNORMAL HIGH (ref 150–400)
RBC: 3.21 MIL/uL — ABNORMAL LOW (ref 4.22–5.81)
RDW: 15.3 % (ref 11.5–15.5)
WBC: 14 10*3/uL — ABNORMAL HIGH (ref 4.0–10.5)

## 2018-06-10 LAB — HEPARIN LEVEL (UNFRACTIONATED): HEPARIN UNFRACTIONATED: 0.56 [IU]/mL (ref 0.30–0.70)

## 2018-06-10 MED ORDER — PREMIER PROTEIN SHAKE
11.0000 [oz_av] | ORAL | Status: DC
  Administered 2018-06-10 – 2018-06-11 (×2): 11 [oz_av] via ORAL
  Filled 2018-06-10 (×6): qty 325.31

## 2018-06-10 MED ORDER — BOOST / RESOURCE BREEZE PO LIQD CUSTOM
1.0000 | Freq: Two times a day (BID) | ORAL | Status: DC
  Administered 2018-06-10 – 2018-06-13 (×3): 1 via ORAL

## 2018-06-10 NOTE — Progress Notes (Signed)
[facility] for heparin drip Indication: pulmonary embolus  Allergies  Allergen Reactions  . Cephalexin Other (See Comments)    "Sore throat" REACTION: SORE THROAT  . Other     Bee sting  . Latex Rash  . Tape Rash    Patient Measurements: Height: _0  (180.3 cm) Weight: 187 lb 1.6 oz (84.9 kg) IBW/kg (Calculated) : 75.3 Heparin Dosing Weight: 86 kg  Vital Signs:  Temp: 97.8 F (36.6 C) (06/26 2152) Temp Source: Oral (06/26 2152) BP: 113/79 (06/26 2152) Pulse Rate: 78 (06/26 2152)  Labs: Recent Labs    06/07/18 1428 06/08/18 1504 06/09/18 1254 06/09/18 2255  HGB 11.3* 11.1* 11.1*  --   HCT 33.5* 33.8* 34.1*  --   PLT 473.0* 479* 530*  --   APTT  --   --  31  --   LABPROT  --   --  17.0*  --   INR  --   --  1.40  --   HEPARINUNFRC  --   --   --  0.57  CREATININE 0.92 0.91 1.04  --     Estimated Creatinine Clearance: 86.5 mL/min (by C-G formula based on SCr of 1.04 mg/dL).   Assessment: Pharmacy consulted to dose and monitor heparin in this 57 year old male diagnosed with PE. 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 Per completed med rec and insurance claims, patient is not on any anticoagulation prior to admission.  Baseline labs have been ordered.  Today, 06/10/18  Hgb 11.1, Plt 530, INR 1.4  First heparin level is therapeutic at 0.57 after 4500 unit bolus and drip at 1450 units/hr.    No bleeding reported  Goal of Therapy:  Heparin level 0.3-0.7 units/ml Monitor platelets by anticoagulation protocol: Yes   Plan:   continue heparin infusion of 1450 units/hr  CBC and HL daily  Monitor patient for any signs/symptoms of bleeding  F/u transition to oral agent  Eudelia Bunch, Pharm.D. [phone] 06/10/2018 12:15 AM

## 2018-06-10 NOTE — Care Management Note (Signed)
Case Management Note  Patient Details  Name: WILKIE ZENON MRN: [id number] Date of Birth: September 13, 1961  Subjective/Objective:Per MD request-may d/c on lovenox-insurance checked $60 co pay(patient can afford). Noted on 02-will monitor if needed for home.                    Action/Plan:d/c home.   Expected Discharge Date:  (unknown)               Expected Discharge Plan:  Home/Self Care  In-House Referral:     Discharge planning Services  CM Consult, Other - See comment(checked insurance for lovenox-$60 co pay(can afford))  Post Acute Care Choice:    Choice offered to:     DME Arranged:    DME Agency:     HH Arranged:    HH Agency:     Status of Service:  In process, will continue to follow  If discussed at Long Length of Stay Meetings, dates discussed:    Additional Comments:  Dessa Phi, RN 06/10/2018, 3:49 PM

## 2018-06-10 NOTE — Progress Notes (Signed)
PROGRESS NOTE    LUQMAN PERRELLI  MPN:[id number] DOB: 07-19-1961 DOA: 06/09/2018 PCP: Ma Hillock, DO   Brief Narrative:  57 y.o. male with medical history significant of adenocarcinoma-gastric, transaminitis/ elevated total bilirubin status post drain placement 6/14, GERD, chronic constipation recently diagnosed of gastric cancer comes back to the hospital with complains of shortness of breath.  Patient was recently here and discharged on June 04, 2018 after being treated for elevated LFTs with percutaneous biliary drain, status post endoscopy with gastric biopsy suggestive of adenocarcinoma and started on FOLFOX on June 19.  He returns to the hospital with complains of shortness of breath and hypoxia.  He was found to have submassive pulmonary embolism with right-sided heart strain therefore admitted on heparin drip.  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  Acute subsegmental/submassive pulmonary embolism with cor pulmonale -Currently on 3 L nasal cannula saturating greater than 95%. -CTA of the chest shows pulmonary embolism with right heart strain - Cont Heparin Drip atleast Until Saturday evening with plans to transition to Lovenox 1 mgkg q12hrs thereafter -LE Dopplers Prelim= + DVT -Echo results - pedning - Pain control - Appreciate input from oncology, Dr Minus Breeding adenocarcinoma Transaminitis with mild ductal dilation/elevated total bilirubin -Patient is status post biliary drain placement followed by interventional radiology.  This was placed on 05/28/18 -Continue to trend LFTs -Management per outpatient oncology  History of GERD -Continue PPI   DVT prophylaxis: On Hep Drip  Code Status: Full  Family Communication: None at  bedside  Disposition Plan:  TBD Consults called: Oncology, Dr Marin Olp  Admission status: Maintain inpatient stay.   Consultants:   Oncology   Procedures:   None  Antimicrobials:   None   Subjective: No complaints.  No acute events overnight.  Review of Systems Otherwise negative except as per HPI, including: General: Denies fever, chills, night sweats or unintended weight loss. Resp: Denies cough, wheezing Cardiac: Denies chest pain, palpitations, orthopnea, paroxysmal nocturnal dyspnea. GI: Denies abdominal pain, nausea, vomiting, diarrhea or constipation GU: Denies dysuria, frequency, hesitancy or incontinence MS: Denies muscle aches, joint pain or swelling Neuro: Denies headache, neurologic deficits (focal weakness, numbness, tingling), abnormal gait Psych: Denies anxiety, depression, SI/HI/AVH Skin: Denies new rashes or lesions ID: Denies sick contacts, exotic exposures, travel  Objective: Vitals:   06/09/18 1859 06/09/18 1903 06/09/18 2152 06/10/18 0547  BP:  120/82 113/79 111/69  Pulse:  83 78 73  Resp:  18 18 18   Temp:  97.6 F (36.4 C) 97.8 F (36.6 C) (!) 97.5 F (36.4 C)  TempSrc:  Oral Oral Oral  SpO2:  96% 96% 94%  Weight: 84.9 kg (187 lb 1.6 oz)   85.2 kg (187 lb 12.8 oz)  Height: 5\' 11"  (1.803 m)       Intake/Output Summary (Last 24 hours) at 06/10/2018 1123 Last data filed at 06/10/2018 0900 Gross per 24 hour  Intake 1086.25 ml  Output 400 ml  Net 686.25 ml   Filed Weights   06/09/18 1221 06/09/18 1859 06/10/18 0547  Weight: 85.7 kg (189 lb) 84.9 kg (187 lb 1.6 oz) 85.2 kg (187 lb 12.8 oz)    Examination:  General exam: Appears calm and comfortable ; on 3L [place]. Chest wall chemo port in  place.  Respiratory system: Clear to auscultation. Respiratory effort normal. Cardiovascular system: S1 & S2 heard, RRR. No JVD, murmurs, rubs, gallops or clicks. No pedal edema. Gastrointestinal system: Abdomen is nondistended, soft and nontender. No  organomegaly or masses felt. Normal bowel sounds heard. Central nervous system: Alert and oriented. No focal neurological deficits. Extremities: Symmetric 5 x 5 power. Skin: No rashes, lesions or ulcers Psychiatry: Judgement and insight appear normal. Mood & affect appropriate.     Data Reviewed:   CBC: Recent Labs  Lab 06/04/18 0620 06/07/18 1428 06/08/18 1504 06/09/18 1254 06/10/18 0454  WBC 10.4 11.7* 12.4* 14.3* 14.0*  NEUTROABS 8.9* 9.4* 10.3* 11.9*  --   HGB 11.4* 11.3* 11.1* 11.1* 9.7*  HCT 34.2* 33.5* 33.8* 34.1* 30.4*  MCV 92.2 92.4 93.1 94.2 94.7  PLT 541* 473.0* 479* 530* 170*   Basic Metabolic Panel: Recent Labs  Lab 06/04/18 0620 06/07/18 1428 06/08/18 1504 06/09/18 1254 06/10/18 0454  NA 138 136 137 138 140  K 3.7 4.8 4.0 3.9 4.2  CL 97* 94* 95* 97* 101  CO2 28 29 28 28 31   GLUCOSE 129* 125* 143* 148* 112*  BUN 11 13 14 17 15   CREATININE 0.76 0.92 0.91 1.04 0.87  CALCIUM 9.0 9.9 9.1 9.2 8.8*  MG  --   --   --   --  2.1   GFR: Estimated Creatinine Clearance: 103.4 mL/min (by C-G formula based on SCr of 0.87 mg/dL). Liver Function Tests: Recent Labs  Lab 06/04/18 0620 06/07/18 1428 06/08/18 1504 06/09/18 1254  AST 245* 157* 152* 144*  ALT 227* 173* 158* 153*  ALKPHOS 465* 534* 522* 522*  BILITOT 7.9* 9.7* 8.2* 8.2*  PROT 7.6 7.5 8.2* 8.2*  ALBUMIN 3.0* 4.0 3.2* 3.2*   Recent Labs  Lab 06/09/18 1254  LIPASE 57*   No results for input(s): AMMONIA in the last 168 hours. Coagulation Profile: Recent Labs  Lab 06/09/18 1254  INR 1.40   Cardiac Enzymes: No results for input(s): CKTOTAL, CKMB, CKMBINDEX, TROPONINI in the last 168 hours. BNP (last 3 results) No results for input(s): PROBNP in the last 8760 hours. HbA1C: No results for input(s): HGBA1C in the last 72 hours. CBG: No results for input(s): GLUCAP in the last 168 hours. Lipid Profile: No results for input(s): CHOL, HDL, LDLCALC, TRIG, CHOLHDL, LDLDIRECT in the last 72  hours. Thyroid Function Tests: No results for input(s): TSH, T4TOTAL, FREET4, T3FREE, THYROIDAB in the last 72 hours. Anemia Panel: Recent Labs    06/08/18 1504  FERRITIN 597*  TIBC 314  IRON 23*   Sepsis Labs: Recent Labs  Lab 06/09/18 1316  LATICACIDVEN 1.74    Recent Results (from the past 240 hour(s))  Culture, blood (routine x 2)     Status: None (Preliminary result)   Collection Time: 06/09/18  1:03 PM  Result Value Ref Range Status   Specimen Description   Final    BLOOD LEFT ANTECUBITAL Performed at [facility] [address] [place], [place] [address]    Special Requests   Final    BOTTLES DRAWN AEROBIC AND ANAEROBIC Blood Culture adequate volume Performed at [facility] [address], [place], [place] [address]    Culture   Final    NO GROWTH < 24 HOURS Performed at [facility] [address] [place],  [address]    Report Status PENDING  Incomplete  Culture, blood (routine x 2)     Status: None (  Preliminary result)   Collection Time: 06/09/18  1:03 PM  Result Value Ref Range Status   Specimen Description   Final    BLOOD PORTA CATH Performed at [facility] [address] [place], [place] [address]    Special Requests   Final    BOTTLES DRAWN AEROBIC ONLY Blood Culture adequate volume Performed at [facility] [address] [place],  [address]    Culture   Final    NO GROWTH < 24 HOURS Performed at [facility] [address], [place],  [address]    Report Status PENDING  Incomplete         Radiology Studies: Dg Chest 2 View  Result Date: 06/09/2018 CLINICAL DATA:  Shortness of breath. EXAM: CHEST - 2 VIEW COMPARISON:  Radiographs of August 25, 2007. CT scan of May 27, 2018. FINDINGS: Stable cardiomediastinal silhouette. No pneumothorax or pleural effusion is noted. Multiple old bilateral rib fractures are noted. Old  bilateral clavicular fractures are noted. Right internal jugular Port-A-Cath is noted with distal tip in expected position of cavoatrial junction. IMPRESSION: Multiple old bilateral rib and clavicular fractures. No acute intracranial abnormality seen. Electronically Signed   By: Marijo Conception, M.D.   On: 06/09/2018 14:06   Ct Angio Chest Pe W/cm &/or Wo Cm  Result Date: 06/09/2018 CLINICAL DATA:  Shortness of breath EXAM: CT ANGIOGRAPHY CHEST CT ABDOMEN AND PELVIS WITH CONTRAST TECHNIQUE: Multidetector CT imaging of the chest was performed using the standard protocol during bolus administration of intravenous contrast. Multiplanar CT image reconstructions and MIPs were obtained to evaluate the vascular anatomy. Multidetector CT imaging of the abdomen and pelvis was performed using the standard protocol during bolus administration of intravenous contrast. CONTRAST:  151mL ISOVUE-370 IOPAMIDOL (ISOVUE-370) INJECTION 76% COMPARISON:  06/01/2018 abdominal CT FINDINGS: CTA CHEST FINDINGS Cardiovascular: Large volume of acute pulmonary embolism seen branching into the right-sided lobar vessels, occlusive to nearly occlusive, and seen within multiple left lower lobe segmental branches. Abnormal RV to LV ratio of 1.26. No pericardial effusion. No acute systemic arterial finding. Mediastinum/Nodes: Negative for adenopathy. Low-density, likely mucinous material projects through the esophageal hiatus. Lungs/Pleura: No acute finding.  No edema or infarct Musculoskeletal: Remote bilateral rib fractures. Remote bilateral clavicle fracture. No acute osseous finding Review of the MIP images confirms the above findings. Critical Value/emergent results were called by telephone at the time of interpretation on 06/09/2018 at 4:15 pm to Dr. Charlesetta Shanks , who verbally acknowledged these results. CT ABDOMEN and PELVIS FINDINGS Hepatobiliary: Subcapsular low-density masses along the posterior right liver, peritoneal metastatic  disease.Internal transhepatic biliary catheter from the right. The tube is well positioned. There is similar intrahepatic bile duct dilatation Pancreas: Gastric mass invades the tail of pancreas. Spleen: Absent. Adrenals/Urinary Tract: Negative adrenals. No hydronephrosis or stone. Unremarkable bladder. Stomach/Bowel: History of gastric cancer correlating with low-density thickening and mucosal discontinuity at the level of the body, extending extra serosal. There is known peritoneal metastatic disease with nodularity and low-density collections or mucinous material. Negative for bowel obstruction Vascular/Lymphatic: No visible iliac vein thrombus. Minimal atherosclerotic calcification . Reproductive:Negative Other: No ascites or pneumoperitoneum. Musculoskeletal: No acute or aggressive finding. Remote right-sided lumbar transverse process fractures Review of the MIP images confirms the above findings. IMPRESSION: 1. Large volume acute pulmonary embolism with multi lobar clot on the right and multi segment clot on the left. Right heart strain (RV/LV Ratio = 1.2) consistent with at least submassive (intermediate risk) PE.  The presence of right heart strain has been associated with an increased risk of morbidity and mortality. Please activate Code PE by paging [phone]. 2. Negative for pulmonary edema or lung infarct. 3. Known gastric cancer with peritoneal metastatic disease. No acute finding in the abdomen. 4. Recent trans hepatic biliary drain up sizing. The tube is in good position. Electronically Signed   By: Monte Fantasia M.D.   On: 06/09/2018 16:23   Ct Abdomen Pelvis W Contrast  Result Date: 06/09/2018 CLINICAL DATA:  Shortness of breath EXAM: CT ANGIOGRAPHY CHEST CT ABDOMEN AND PELVIS WITH CONTRAST TECHNIQUE: Multidetector CT imaging of the chest was performed using the standard protocol during bolus administration of intravenous contrast. Multiplanar CT image reconstructions and MIPs were obtained  to evaluate the vascular anatomy. Multidetector CT imaging of the abdomen and pelvis was performed using the standard protocol during bolus administration of intravenous contrast. CONTRAST:  174mL ISOVUE-370 IOPAMIDOL (ISOVUE-370) INJECTION 76% COMPARISON:  06/01/2018 abdominal CT FINDINGS: CTA CHEST FINDINGS Cardiovascular: Large volume of acute pulmonary embolism seen branching into the right-sided lobar vessels, occlusive to nearly occlusive, and seen within multiple left lower lobe segmental branches. Abnormal RV to LV ratio of 1.26. No pericardial effusion. No acute systemic arterial finding. Mediastinum/Nodes: Negative for adenopathy. Low-density, likely mucinous material projects through the esophageal hiatus. Lungs/Pleura: No acute finding.  No edema or infarct Musculoskeletal: Remote bilateral rib fractures. Remote bilateral clavicle fracture. No acute osseous finding Review of the MIP images confirms the above findings. Critical Value/emergent results were called by telephone at the time of interpretation on 06/09/2018 at 4:15 pm to Dr. Charlesetta Shanks , who verbally acknowledged these results. CT ABDOMEN and PELVIS FINDINGS Hepatobiliary: Subcapsular low-density masses along the posterior right liver, peritoneal metastatic disease.Internal transhepatic biliary catheter from the right. The tube is well positioned. There is similar intrahepatic bile duct dilatation Pancreas: Gastric mass invades the tail of pancreas. Spleen: Absent. Adrenals/Urinary Tract: Negative adrenals. No hydronephrosis or stone. Unremarkable bladder. Stomach/Bowel: History of gastric cancer correlating with low-density thickening and mucosal discontinuity at the level of the body, extending extra serosal. There is known peritoneal metastatic disease with nodularity and low-density collections or mucinous material. Negative for bowel obstruction Vascular/Lymphatic: No visible iliac vein thrombus. Minimal atherosclerotic calcification .  Reproductive:Negative Other: No ascites or pneumoperitoneum. Musculoskeletal: No acute or aggressive finding. Remote right-sided lumbar transverse process fractures Review of the MIP images confirms the above findings. IMPRESSION: 1. Large volume acute pulmonary embolism with multi lobar clot on the right and multi segment clot on the left. Right heart strain (RV/LV Ratio = 1.2) consistent with at least submassive (intermediate risk) PE. The presence of right heart strain has been associated with an increased risk of morbidity and mortality. Please activate Code PE by paging [phone]. 2. Negative for pulmonary edema or lung infarct. 3. Known gastric cancer with peritoneal metastatic disease. No acute finding in the abdomen. 4. Recent trans hepatic biliary drain up sizing. The tube is in good position. Electronically Signed   By: Monte Fantasia M.D.   On: 06/09/2018 16:23        Scheduled Meds: . dronabinol  5 mg Oral BID AC  . [START ON 06/12/2018] fentaNYL  25 mcg Transdermal Q72H  . fluconazole  200 mg Oral Daily  . loratadine  10 mg Oral Daily  . multivitamin with minerals  1 tablet Oral Daily  . pantoprazole  40 mg Oral Daily  . polyethylene glycol  17 g Oral Daily  . vitamin  B-12  1,000 mcg Oral Daily   Continuous Infusions: . sodium chloride 75 mL/hr at 06/09/18 1843  . heparin 1,450 Units/hr (06/09/18 1652)     LOS: 1 day    I have spent 35 minutes face to face with the patient and on the ward discussing the patients care, assessment, plan and disposition with other care givers. >50% of the time was devoted counseling the patient about the risks and benefits of treatment and coordinating care.     Safir Michalec Arsenio Loader, MD [facility] Pager [phone]   If 7PM-7AM, please contact night-coverage [email or web address] Password [facility] 06/10/2018, 11:23 AM

## 2018-06-10 NOTE — Progress Notes (Signed)
*  PRELIMINARY RESULTS* Vascular Ultrasound Lower Extremity Venous Study has been completed.  Preliminary findings: There is evidence of an acute partial DVT in the right Femoral Vein. There is evidence of an acute DVT in the left peroneal veins. No evidence of bakers cyst bilaterally.   Darlina Sicilian M 06/10/2018, 9:52 AM

## 2018-06-10 NOTE — Progress Notes (Signed)
Initial Nutrition Assessment  DOCUMENTATION CODES:   (Will assess for malnutrition at follow-up. )  INTERVENTION:  - Will order Boost Breeze BID, each supplement provides 250 kcal and 9 grams of protein. - Will order Premier Protein once/day, this supplement provides 160 kcal and 30 grams of protein.  - Continue to encourage PO intakes. - RD will monitor for additional nutrition-related needs.  NUTRITION DIAGNOSIS:   Increased nutrient needs related to catabolic illness, cancer and cancer related treatments as evidenced by estimated needs.  GOAL:   Patient will meet greater than or equal to 90% of their needs  MONITOR:   PO intake, Supplement acceptance, Weight trends, Labs  REASON FOR ASSESSMENT:   Malnutrition Screening Tool  ASSESSMENT:   57 y.o. male with medical history significant of recently diagnosed gastric adenocarcinoma, transaminitis/elevated total bilirubin status post drain placement 05/28/18, GERD, and chronic constipation. Patient was recently here and discharged on 6/21 after being treated for elevated LFTs with percutaneous biliary drain, status post endoscopy with gastric biopsy suggestive of adenocarcinoma and started on FOLFOX on 6/19.  He returns to the hospital with complains of shortness of breath and hypoxia.  He was found to have submassive pulmonary embolism with right-sided heart strain and was admitted on heparin drip.  BMI indicates overweight status. Patient states he was able to eat scrambled eggs and drink coffee for breakfast this AM. He had ordered grits but they got cold d/t care team visits around breakfast time. He had not yet ordered lunch at the time of RD visit about 40 minutes ago.   He reports lower chest/upper abdominal pain started several months ago and was often exacerbated by eating. Due to this, his intakes decreased and his desire to eat decreased. He states that intakes have slightly improved over the past few days, especially with  medication to help alleviate pain, but that he still feels constant abdominal discomfort. He was able to eat ~90% of a bowl of chicken noodle soup for dinner last night which he states is the most he had eaten in several days.   Patient was very pleasant but desired some time alone to rest. He states that PTA he felt that SOB was d/t anxiety as he had moved from his home into his parents' home after d/c on 6/21 and that his mom would ask him questions frequently and wanted to do everything for him. He gives the example that if he stood up she would ask what he was doing and what he needed. He states that now if he is talking to much or frequently about dx and symptoms he can feel his anxiety increasing.   Patient deferred NFPE at this time. Per chart review, he has lost 8 lbs (4% body weight) in the past 17 days; this is significant for time frame. Suspect malnutrition but unable to verify without NFPE. Unable to talk with patient about ONS at this time but he has been enjoying soda and orange Gatorade.   Medications reviewed; 5 mg Marinol BID, 40 mg Protonix/day, 1 packet Miralax, 100 mcg vitamin B12. Labs reviewed.  IVF: NS @ 75 mL/hr.       NUTRITION - FOCUSED PHYSICAL EXAM:  Unable to complete at this time per patient request; will attempt at follow-up.   Diet Order:   Diet Order           Diet regular Room service appropriate? Yes; Fluid consistency: Thin  Diet effective now  EDUCATION NEEDS:   No education needs have been identified at this time  Skin:  Skin Assessment: Reviewed RN Assessment  Last BM:  PTA/unknown; H&P states patient with chronic constipation.   Height:   Ht Readings from Last 1 Encounters:  06/09/18 5\' 11"  (1.803 m)    Weight:   Wt Readings from Last 1 Encounters:  06/10/18 187 lb 12.8 oz (85.2 kg)    Ideal Body Weight:  78.18 kg  BMI:  Body mass index is 26.19 kg/m.  Estimated Nutritional Needs:   Kcal:  8887-5797  Protein:   125-135 grams  Fluid:  >/= 2 L/day      Jarome Matin, MS, RD, LDN, [facility] Inpatient Clinical Dietitian Pager # [phone] After hours/weekend pager # [phone]

## 2018-06-10 NOTE — Progress Notes (Signed)
[place] for heparin drip Indication: pulmonary embolus  Allergies  Allergen Reactions  . Cephalexin Other (See Comments)    "Sore throat" REACTION: SORE THROAT  . Other     Bee sting  . Latex Rash  . Tape Rash    Patient Measurements: Height: '5\' 11"'$  (180.3 cm) Weight: 187 lb 12.8 oz (85.2 kg) IBW/kg (Calculated) : 75.3 Heparin Dosing Weight: 86 kg  Vital Signs:  Temp: 97.5 F (36.4 C) (06/27 0547) Temp Source: Oral (06/27 0547) BP: 111/69 (06/27 0547) Pulse Rate: 73 (06/27 0547)  Labs: Recent Labs    06/08/18 1504 06/09/18 1254 06/09/18 2255 06/10/18 0454  HGB 11.1* 11.1*  --  9.7*  HCT 33.8* 34.1*  --  30.4*  PLT 479* 530*  --  439*  APTT  --  31  --   --   LABPROT  --  17.0*  --   --   INR  --  1.40  --   --   HEPARINUNFRC  --   --  0.57 0.56  CREATININE 0.91 1.04  --  0.87    Estimated Creatinine Clearance: 103.4 mL/min (by C-G formula based on SCr of 0.87 mg/dL).   Assessment: Pharmacy consulted to dose and monitor heparin in this 57 year old male diagnosed with PE. 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 Per completed med rec and insurance claims, patient is not on any anticoagulation prior to admission. Baseline labs are WNL.  Today, 06/10/18  Hgb 9.7,  Plt 439  confirmatory heparin level =0.56, therapeutic  No bleeding reported  Goal of Therapy:  Heparin level 0.3-0.7 units/ml Monitor platelets by anticoagulation protocol: Yes   Plan:   continue heparin infusion of 1450 units/hr  CBC and HL daily  Monitor patient for any signs/symptoms of bleeding  F/u transition to oral agent  Dolly Rias RPh 06/10/2018, 8:15 AM Pager [phone]

## 2018-06-10 NOTE — Progress Notes (Signed)
[facility]  Patient Status: Active (receiving services up to time of hospitalization)  [facility] is providing the following services: RN  If patient discharges after hours, please call [phone].   [name] 06/10/2018, 1:08 PM

## 2018-06-10 NOTE — Consult Note (Signed)
Referral MD  Reason for Referral: Extensive pulmonary embolism; metastatic gastric cancer  Chief Complaint  Patient presents with  . Shortness of Breath  : I had a hard time breathing.  HPI: [name] is well-known to me.  He is a nice 57 year old white male.  He was really diagnosed with extensive metastatic gastric cancer.  He had a upper endoscopy which showed extensive stomach involvement.  Biopsies confirmed adenocarcinoma consistent with gastric cancer.  I just saw him in the office a day or so ago.  He actually was doing fairly well.  He was complaining of some cramping in his right calf.  Yesterday, he was seen by the home health care nurse.  He was complaining of some shortness of breath.  He had a an O2 sat of 70%.  I felt that he likely had a pulmonary embolism.  He has had significant risk for this.  He went to the emergency room.  He ultimately had a CT angiogram done.  He had a large volume pulmonary embolism with multi lobar clots in both lungs.  He had right heart strain.  There is no pulmonary edema or lung infarct.  His labs showed his bilirubin to be 8.2.  Creatinine 1.04.  White cell count 14,000.  Hemoglobin 11.1.  Platelet count 530,000.  He was started on heparin and admitted.  Her graph he is feeling better this morning.  His breathing is not as labored.  He has had no cough.  There is been no hemoptysis.  He has had some soreness in his mouth.  He has had no diarrhea.  Overall, his performance status is ECOG 2.   Past Medical History:  Diagnosis Date  . Allergy   . Arthritis   . Asplenia   . Basal cell carcinoma   . Erectile dysfunction   . Facial fracture ([place])   . GERD (gastroesophageal reflux disease)   . History of chicken pox   . Melanoma of thigh ([place])    left  . Metastasis from gastric cancer ([place]) 06/01/2018  . Squamous cell carcinoma, arm    left  :  Past Surgical History:  Procedure Laterality Date  . BIOPSY  05/28/2018   Procedure:  BIOPSY;  Surgeon: Milus Banister, MD;  Location: [facility];  Service: Endoscopy;;  . CHOLECYSTECTOMY  2009  . ESOPHAGOGASTRODUODENOSCOPY (EGD) WITH PROPOFOL Left 05/28/2018   Procedure: ESOPHAGOGASTRODUODENOSCOPY (EGD) WITH PROPOFOL;  Surgeon: Milus Banister, MD;  Location: [facility];  Service: Endoscopy;  Laterality: Left;  . IR CHOLANGIOGRAM EXISTING TUBE  06/01/2018  . IR EXCHANGE BILIARY DRAIN  06/03/2018  . IR IMAGING GUIDED PORT INSERTION  05/31/2018  . IR INT EXT BILIARY DRAIN WITH CHOLANGIOGRAM  05/28/2018  . MANDIBLE SURGERY    . MELANOMA EXCISION WITH SENTINEL LYMPH NODE BIOPSY  1999  . NASAL SINUS SURGERY    . ORIF ORBITAL FRACTURE    . PALATE SURGERY    . right arm  09/14/08   x2 plates/screws  . SPLENECTOMY, TOTAL  2008   trauma  . zygomtic Left 2015   fracture/plate-screw  :   Current Facility-Administered Medications:  .  0.9 %  sodium chloride infusion, , Intravenous, Continuous, Amin, Ankit Chirag, MD, Last Rate: 75 mL/hr at 06/09/18 1843 .  acetaminophen (TYLENOL) tablet 650 mg, 650 mg, Oral, Q6H PRN **OR** acetaminophen (TYLENOL) suppository 650 mg, 650 mg, Rectal, Q6H PRN, Amin, Ankit Chirag, MD .  bisacodyl (DULCOLAX) EC tablet 5 mg, 5 mg, Oral, Daily  PRN, Damita Lack, MD .  dronabinol (MARINOL) capsule 5 mg, 5 mg, Oral, BID AC, Amin, Ankit Chirag, MD .  Derrill Memo ON 06/12/2018] fentaNYL (DURAGESIC - dosed mcg/hr) patch 25 mcg, 25 mcg, Transdermal, Q72H, Amin, Ankit Chirag, MD .  fluconazole (DIFLUCAN) tablet 200 mg, 200 mg, Oral, Daily, Amin, Ankit Chirag, MD, 200 mg at 06/09/18 2138 .  heparin ADULT infusion 100 units/mL (25000 units/215mL sodium chloride 0.45%), 1,450 Units/hr, Intravenous, Continuous, Amin, Ankit Chirag, MD, Last Rate: 14.5 mL/hr at 06/09/18 1652, 1,450 Units/hr at 06/09/18 1652 .  HYDROmorphone (DILAUDID) injection 1 mg, 1 mg, Intravenous, Q2H PRN, Amin, Ankit Chirag, MD, 1 mg at 06/10/18 0555 .  loratadine (CLARITIN) tablet 10 mg, 10  mg, Oral, Daily, Amin, Ankit Chirag, MD .  multivitamin with minerals tablet 1 tablet, 1 tablet, Oral, Daily, Swayne, Mary M, RPH .  oxyCODONE (Oxy IR/ROXICODONE) immediate release tablet 5 mg, 5 mg, Oral, Q8H PRN, Amin, Ankit Chirag, MD .  pantoprazole (PROTONIX) EC tablet 40 mg, 40 mg, Oral, Daily, Amin, Ankit Chirag, MD .  polyethylene glycol (MIRALAX / GLYCOLAX) packet 17 g, 17 g, Oral, Daily, Amin, Ankit Chirag, MD .  prochlorperazine (COMPAZINE) tablet 10 mg, 10 mg, Oral, Q6H PRN, Amin, Ankit Chirag, MD .  senna-docusate (Senokot-S) tablet 1 tablet, 1 tablet, Oral, QHS PRN, Amin, Ankit Chirag, MD .  traZODone (DESYREL) tablet 25 mg, 25 mg, Oral, QHS PRN, Amin, Ankit Chirag, MD .  vitamin B-12 (CYANOCOBALAMIN) tablet 1,000 mcg, 1,000 mcg, Oral, Daily, Amin, Ankit Chirag, MD, 1,000 mcg at 06/09/18 2138:  . dronabinol  5 mg Oral BID AC  . [START ON 06/12/2018] fentaNYL  25 mcg Transdermal Q72H  . fluconazole  200 mg Oral Daily  . loratadine  10 mg Oral Daily  . multivitamin with minerals  1 tablet Oral Daily  . pantoprazole  40 mg Oral Daily  . polyethylene glycol  17 g Oral Daily  . vitamin B-12  1,000 mcg Oral Daily  :  Allergies  Allergen Reactions  . Cephalexin Other (See Comments)    "Sore throat" REACTION: SORE THROAT  . Other     Bee sting  . Latex Rash  . Tape Rash  :  Family History  Problem Relation Age of Onset  . Arthritis Mother   . Heart disease Mother   . Arthritis Father   . Hearing loss Father   . Pancreatic cancer Maternal Grandfather   . Pancreatic cancer Paternal Grandfather   :  Social History   Socioeconomic History  . Marital status: Divorced    Spouse name: Not on file  . Number of children: Not on file  . Years of education: Not on file  . Highest education level: Not on file  Occupational History  . Not on file  Social Needs  . Financial resource strain: Not on file  . Food insecurity:    Worry: Not on file    Inability: Not on file   . Transportation needs:    Medical: Not on file    Non-medical: Not on file  Tobacco Use  . Smoking status: Former Smoker    Packs/day: 1.00    Last attempt to quit: 12/07/2011    Years since quitting: 6.5  . Smokeless tobacco: Never Used  Substance and Sexual Activity  . Alcohol use: Yes  . Drug use: No  . Sexual activity: Yes    Birth control/protection: Condom  Lifestyle  . Physical activity:    Days per  week: Not on file    Minutes per session: Not on file  . Stress: Not on file  Relationships  . Social connections:    Talks on phone: Not on file    Gets together: Not on file    Attends religious service: Not on file    Active member of club or organization: Not on file    Attends meetings of clubs or organizations: Not on file    Relationship status: Not on file  . Intimate partner violence:    Fear of current or ex partner: Not on file    Emotionally abused: Not on file    Physically abused: Not on file    Forced sexual activity: Not on file  Other Topics Concern  . Not on file  Social History Narrative   Divorced, 1 son (Rylan).   2 years of college.   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    Former smoker (quit 12/07/2011), no drug use, 1-2 drinks a week.   Drinks caffeine, takes a daily vitamin   Wears his seatbelt, smoke detector in the home, firearms in the home.    Exercises > 3x a day   Feels safe in relationships  :  Pertinent items are noted in HPI.  Exam: As above Patient Vitals for the past 24 hrs:  BP Temp Temp src Pulse Resp SpO2 Height Weight  06/10/18 0547 111/69 (!) 97.5 F (36.4 C) Oral 73 18 94 % - 187 lb 12.8 oz (85.2 kg)  06/09/18 2152 113/79 97.8 F (36.6 C) Oral 78 18 96 % - -  06/09/18 1903 120/82 97.6 F (36.4 C) Oral 83 18 96 % - -  06/09/18 1859 - - - - - - 5\' 11"  (1.803 m) 187 lb 1.6 oz (84.9 kg)  06/09/18 1745 113/81 - - 90 (!) 24 96 % - -  06/09/18 1716 116/77 - - 78 (!) 26 100 % - -  06/09/18 1715 116/77  - - 78 18 100 % - -  06/09/18 1519 132/79 - - (!) 106 17 97 % - -  06/09/18 1515 122/80 - - 86 (!) 26 99 % - -  06/09/18 1430 97/77 - - 90 (!) 38 94 % - -  06/09/18 1400 109/82 - - 95 (!) 27 93 % - -  06/09/18 1245 115/81 - - 86 (!) 23 95 % - -  06/09/18 1221 - - - - - - 5\' 11"  (1.803 m) 189 lb (85.7 kg)     Recent Labs    06/09/18 1254 06/10/18 0454  WBC 14.3* 14.0*  HGB 11.1* 9.7*  HCT 34.1* 30.4*  PLT 530* 439*   Recent Labs    06/09/18 1254 06/10/18 0454  NA 138 140  K 3.9 4.2  CL 97* 101  CO2 28 31  GLUCOSE 148* 112*  BUN 17 15  CREATININE 1.04 0.87  CALCIUM 9.2 8.8*    Blood smear review: None  Pathology: None    Assessment and Plan: [name] is a 57 year old white male.  He has metastatic gastric cancer.  He has extensive disease.  He has had one cycle of chemotherapy with FOLFOX.  It is no surprise that there is a thromboembolism in his lung.  I am sure he has a thrombus in his legs.  I would keep him on heparin for right now.  Heparin can be controlled nicely.  I would keep him on heparin for a good 3-4 days.  I would then switch him over to Lovenox.  Given his clot burden, I think that Lovenox would be a good idea for him to be on initially as an outpatient.  We will see what the Dopplers showing his legs.  I am sure that he has blood clots in one or both legs.  He still has the external biliary drainage bag on.  He will need this as long as he has a extensive hepatic involvement.  Hopefully, with treatment, the hepatic disease burden will decrease.  The biliary drainage can hopefully be internalized.  Again, I would keep him on heparin for but.  I would use heparin for probably 4 days.  I will then switch over to Lovenox and treat with Lovenox as an outpatient.  I appreciate everybody's great care for [name].  I know that he will get outstanding care from everybody on 4 E.  Lattie Haw, MD  Hebrews 12:12

## 2018-06-10 NOTE — Progress Notes (Signed)
  Echocardiogram 2D Echocardiogram has been performed.  [name] M 06/10/2018, 8:41 AM

## 2018-06-11 ENCOUNTER — Inpatient Hospital Stay (HOSPITAL_COMMUNITY): Payer: BLUE CROSS/BLUE SHIELD

## 2018-06-11 ENCOUNTER — Encounter (HOSPITAL_COMMUNITY): Payer: Self-pay | Admitting: Hematology & Oncology

## 2018-06-11 ENCOUNTER — Encounter (HOSPITAL_COMMUNITY): Payer: Self-pay

## 2018-06-11 DIAGNOSIS — I82409 Acute embolism and thrombosis of unspecified deep veins of unspecified lower extremity: Secondary | ICD-10-CM

## 2018-06-11 HISTORY — PX: IR IVC FILTER PLMT / S&I /IMG GUID/MOD SED: IMG701

## 2018-06-11 LAB — MRSA PCR SCREENING: MRSA by PCR: NEGATIVE

## 2018-06-11 LAB — CBC
HEMATOCRIT: 29.2 % — AB (ref 39.0–52.0)
Hemoglobin: 9.2 g/dL — ABNORMAL LOW (ref 13.0–17.0)
MCH: 29.9 pg (ref 26.0–34.0)
MCHC: 31.5 g/dL (ref 30.0–36.0)
MCV: 94.8 fL (ref 78.0–100.0)
Platelets: 473 10*3/uL — ABNORMAL HIGH (ref 150–400)
RBC: 3.08 MIL/uL — AB (ref 4.22–5.81)
RDW: 15.1 % (ref 11.5–15.5)
WBC: 10.8 10*3/uL — AB (ref 4.0–10.5)

## 2018-06-11 LAB — COMPREHENSIVE METABOLIC PANEL
ALBUMIN: 2.5 g/dL — AB (ref 3.5–5.0)
ALK PHOS: 359 U/L — AB (ref 38–126)
ALT: 112 U/L — AB (ref 0–44)
ANION GAP: 10 (ref 5–15)
AST: 115 U/L — ABNORMAL HIGH (ref 15–41)
BUN: 11 mg/dL (ref 6–20)
CHLORIDE: 100 mmol/L (ref 98–111)
CO2: 26 mmol/L (ref 22–32)
CREATININE: 0.72 mg/dL (ref 0.61–1.24)
Calcium: 8.3 mg/dL — ABNORMAL LOW (ref 8.9–10.3)
GFR calc Af Amer: >60 mL/min (ref >60)
GFR calc non Af Amer: >60 mL/min (ref >60)
GLUCOSE: 108 mg/dL — AB (ref 70–99)
Potassium: 3.6 mmol/L (ref 3.5–5.1)
SODIUM: 136 mmol/L (ref 135–145)
Total Bilirubin: 6.3 mg/dL — ABNORMAL HIGH (ref 0.3–1.2)
Total Protein: 6.2 g/dL — ABNORMAL LOW (ref 6.5–8.1)

## 2018-06-11 LAB — HIV ANTIBODY (ROUTINE TESTING W REFLEX): HIV SCREEN 4TH GENERATION: NONREACTIVE

## 2018-06-11 LAB — MAGNESIUM: Magnesium: 2 mg/dL (ref 1.7–2.4)

## 2018-06-11 LAB — HEPARIN LEVEL (UNFRACTIONATED): HEPARIN UNFRACTIONATED: 0.52 [IU]/mL (ref 0.30–0.70)

## 2018-06-11 MED ORDER — LIDOCAINE HCL 1 % IJ SOLN
INTRAMUSCULAR | Status: AC
  Filled 2018-06-11: qty 20

## 2018-06-11 MED ORDER — LORAZEPAM 2 MG/ML IJ SOLN
1.0000 mg | Freq: Four times a day (QID) | INTRAMUSCULAR | Status: DC | PRN
  Administered 2018-06-11: 1 mg via INTRAVENOUS
  Filled 2018-06-11: qty 1

## 2018-06-11 MED ORDER — IOPAMIDOL (ISOVUE-300) INJECTION 61%
INTRAVENOUS | Status: AC
  Administered 2018-06-11: 30 mL
  Filled 2018-06-11: qty 50

## 2018-06-11 MED ORDER — IOPAMIDOL (ISOVUE-370) INJECTION 76%
100.0000 mL | Freq: Once | INTRAVENOUS | Status: AC | PRN
  Administered 2018-06-11: 100 mL via INTRAVENOUS

## 2018-06-11 MED ORDER — IOPAMIDOL (ISOVUE-300) INJECTION 61%: 50.0000 mL | Freq: Once | INTRAVENOUS | Status: DC | PRN

## 2018-06-11 MED ORDER — POTASSIUM CHLORIDE CRYS ER 20 MEQ PO TBCR
40.0000 meq | EXTENDED_RELEASE_TABLET | Freq: Once | ORAL | Status: AC
  Administered 2018-06-11: 40 meq via ORAL
  Filled 2018-06-11: qty 2

## 2018-06-11 MED ORDER — FENTANYL CITRATE (PF) 100 MCG/2ML IJ SOLN
INTRAMUSCULAR | Status: AC | PRN
  Administered 2018-06-11: 50 ug via INTRAVENOUS

## 2018-06-11 MED ORDER — IOPAMIDOL (ISOVUE-370) INJECTION 76%
INTRAVENOUS | Status: AC
  Filled 2018-06-11: qty 100

## 2018-06-11 MED ORDER — LIDOCAINE HCL (PF) 1 % IJ SOLN
INTRAMUSCULAR | Status: AC | PRN
  Administered 2018-06-11: 8 mL

## 2018-06-11 MED ORDER — MIDAZOLAM HCL 2 MG/2ML IJ SOLN
INTRAMUSCULAR | Status: AC
  Filled 2018-06-11: qty 2

## 2018-06-11 MED ORDER — FENTANYL CITRATE (PF) 100 MCG/2ML IJ SOLN
INTRAMUSCULAR | Status: AC
  Filled 2018-06-11: qty 2

## 2018-06-11 MED ORDER — IOPAMIDOL (ISOVUE-300) INJECTION 61%
INTRAVENOUS | Status: AC
  Administered 2018-06-11: 20 mL
  Filled 2018-06-11: qty 100

## 2018-06-11 MED ORDER — MIDAZOLAM HCL 2 MG/2ML IJ SOLN
INTRAMUSCULAR | Status: AC | PRN
  Administered 2018-06-11: 1 mg via INTRAVENOUS

## 2018-06-11 NOTE — Progress Notes (Addendum)
Was called by the nurse that patient felt more short of breath and hypoxic after patient tried to get up to the bathroom for a bowel movement.  Apparently his oxygen was not flowing well through his nasal cannula therefore the tubes were changed and his oxygen was bumped up to 4 L nasal cannula.  When I arrived patient was resting comfortably without complaints on his recliner but did slightly appeared labored in his breathing but was able to calm down.  His vital signs are stable aside saturating 91% on 4 L nasal cannula Slightly diminished breath sounds on the right base.  At this time we will get chest x-ray to see if he is developed any fluid or any other issues.  Later in the day if he still remains hypoxic and tachypneic we will repeat CT of the chest to reevaluate his pulmonary embolism due to concerns of possible migration of his clot.     At this time continue to provide supportive care as planned.  I have also discussed the cost of Lovenox with the patient's father over the phone at the patient's request.  I have deferred the conversation about the cost of Lovenox further discussion with the case manager.  Discussed with the patient's RN.  Appreciate help.  Call with further questions.    Update at 440pm  Dr Anselm Pancoast from radiology called stating CT findings suggesting extension of the clot. Discussed the need for IVC filter with the radiologist- Dr Knox Royalty, Dr Marin Olp and the patient who are in the agreement.  Will order for IVC filter placement. Ordered Ativan for patients anxiety. NPO until the procedure and then resume diet. Will transfer him to the stepdown for closer monitoring after his procedure.

## 2018-06-11 NOTE — Progress Notes (Signed)
ICU/SD received pt around 1845 s/p IVC Filter placement for continuous monitoring. Pt complaining of some pain in abdomen, otherwise no complaints. A&Ox4, on 4LNC but SOB on exertion, NSR, breath sounds clear with very diminished bases, active BS, +2 bilateral radial and pedal pulses

## 2018-06-11 NOTE — Progress Notes (Signed)
Referring Physician(s): [place] Physician: Hassell,D  Patient Status:  [name] - In-pt  Chief Complaint: Dyspnea, chest pain   Subjective: Patient familiar to IR service from prior internal/external biliary drain placement on 05/28/2018, right chest wall Port-A-Cath placement on 05/31/2018 and exchange of biliary drain on 06/03/2018.  He has a history of metastatic gastric carcinoma with biliary obstruction, GERD admitted on 6/26 with dyspnea.  He was found to have submassive PE with right-sided heart strain and placed on heparin drip.  Due to worsening shortness of breath and weakness today patient underwent repeat CT angio of chest which revealed increased pulmonary emboli in the left pulmonary arteries and unchanged large clot burden in right pulmonary arteries.  Slightly increased right heart strain noted.  There continued to be evidence for intrahepatic biliary dilatation despite bili drain.  Bilateral lower extremity venous Doppler studies performed yesterday revealed acute DVT in the right femoral vein, left peroneal veins. Request now received for IVC filter placement. Creat 0.72.   Allergies: Cephalexin; Other; Latex; and Tape  Medications: Prior to Admission medications   Medication Sig Start Date End Date Taking? Authorizing Provider  cetirizine (ZYRTEC) 10 MG tablet Take 10 mg by mouth daily.     Yes [provider]  Cyanocobalamin (VITAMIN B 12 PO) Take 1,000 mcg by mouth daily.   Yes [provider]  esomeprazole (NEXIUM) 20 MG capsule TAKE ONE CAPSULE BY MOUTH EVERY DAY 04/10/15  Yes Dorena Cookey, MD  fentaNYL (DURAGESIC - DOSED MCG/HR) 25 MCG/HR patch Place 1 patch (25 mcg total) onto the skin every 3 (three) days. 06/06/18  Yes Georgette Shell, MD  flintstones complete (FLINTSTONES) 60 MG chewable tablet Chew 1 tablet by mouth daily.   Yes [provider]  fluconazole (DIFLUCAN) 200 MG tablet Take 1 tablet (200 mg total) by mouth  daily. 06/04/18  Yes Georgette Shell, MD  hydrocortisone cream 1 % Apply 1 application topically daily as needed for itching.   Yes [provider]  oxyCODONE (OXY IR/ROXICODONE) 5 MG immediate release tablet Take 1 tablet (5 mg total) by mouth every 8 (eight) hours as needed for severe pain or breakthrough pain. 06/07/18  Yes Kuneff, Renee A, DO  polyethylene glycol (MIRALAX / GLYCOLAX) packet Take 17 g by mouth daily.   Yes [provider]  prochlorperazine (COMPAZINE) 10 MG tablet Take 1 tablet (10 mg total) by mouth every 6 (six) hours as needed for nausea or vomiting. 06/07/18  Yes Kuneff, Renee A, DO  sildenafil (REVATIO) 20 MG tablet 1-5 tabs PO prior to sexually activity. 04/20/18  Yes Kuneff, Renee A, DO  dronabinol (MARINOL) 5 MG capsule Take 1 capsule (5 mg total) by mouth 2 (two) times daily before lunch and supper. Patient not taking: Reported on 06/09/2018 06/08/18   Volanda Napoleon, MD     Vital Signs: BP 118/73 (BP Location: Left Arm)   Pulse 81   Temp 98.4 F (36.9 C) (Oral)   Resp 17   Ht 5\' 11"  (1.803 m)   Wt 193 lb 1.6 oz (87.6 kg)   SpO2 93%   BMI 26.93 kg/m   Physical Exam awake, alert.  Chest with slightly diminished breath sounds left base, right clear.  Heart with regular rate and rhythm.  Abdomen soft, positive bowel sounds, intact right biliary drain with green bile in bag.  Site mildly tender to palpation.  No significant lower extremity edema. Imaging: Dg Chest 2 View  Result Date:  06/09/2018 CLINICAL DATA:  Shortness of breath. EXAM: CHEST - 2 VIEW COMPARISON:  Radiographs of August 25, 2007. CT scan of May 27, 2018. FINDINGS: Stable cardiomediastinal silhouette. No pneumothorax or pleural effusion is noted. Multiple old bilateral rib fractures are noted. Old bilateral clavicular fractures are noted. Right internal jugular Port-A-Cath is noted with distal tip in expected position of cavoatrial junction. IMPRESSION: Multiple old bilateral  rib and clavicular fractures. No acute intracranial abnormality seen. Electronically Signed   By: Marijo Conception, M.D.   On: 06/09/2018 14:06   Ct Angio Chest Pe W Or Wo Contrast  Result Date: 06/11/2018 CLINICAL DATA:  57 year old with metastatic gastric cancer and pulmonary emboli. Worsening shortness of breath and weakness. EXAM: CT ANGIOGRAPHY CHEST WITH CONTRAST TECHNIQUE: Multidetector CT imaging of the chest was performed using the standard protocol during bolus administration of intravenous contrast. Multiplanar CT image reconstructions and MIPs were obtained to evaluate the vascular anatomy. CONTRAST:  149mL ISOVUE-370 IOPAMIDOL (ISOVUE-370) INJECTION 76% COMPARISON:  Chest CTA 06/09/2018. FINDINGS: Cardiovascular: There is new clot in the distal left pulmonary artery. There is increased clot burden in the main left lower lobe pulmonary artery and left lower lobe segmental branches. There is increased clot involving the left upper lobe segmental branches. The large clot burden in the right pulmonary arteries has not significantly changed. There continues to be clot in the distal main right pulmonary artery and extending into the right upper and lower pulmonary arteries. Right ventricle continues to be enlarged and there continues to be evidence for right heart strain. The RV: LV ratio has slightly increased, now measuring close to 1.4. No significant pericardial fluid. Mediastinum/Nodes: Visualized thyroid tissue is unremarkable. Right jugular Port-A-Cath is present. The tip is probably near the cavoatrial junction but difficult to characterize due to motion artifact. Again noted is large amount of fluid in the distal paraesophageal region which is extending up from the abdomen. No significant chest lymphadenopathy. Lungs/Pleura: The trachea and mainstem bronchi are patent. Again noted is atelectasis or scarring in the lingula. There is increased atelectasis or trace pleural fluid at the left lung  base. No large areas of lung consolidation or airspace disease. Upper Abdomen: Patient has an internal/external biliary drain from a right hepatic approach. There is a small amount of perihepatic ascites which is slightly hyperdense. Again noted is a large low-density structure along the posterior right hepatic lobe in the subcapsular region. Again noted are numerous low-density collections throughout the gastrohepatic ligament region and surrounding the stomach and anterior aspect of the pancreas. There is evidence for intrahepatic biliary dilatation despite the percutaneous drain. Evidence for peritoneal disease in the upper abdomen and increased stranding around left adrenal gland. Musculoskeletal: Multiple old rib fractures. Old clavicle fractures. Review of the MIP images confirms the above findings. IMPRESSION: Increased pulmonary emboli in the left pulmonary arteries. The large clot burden in the right pulmonary arteries has not significantly changed. Slightly increased right heart strain based on the RV : LV ratio. No significant changes in the upper abdomen with respect to the metastatic cancer and biliary drain. There continues to be evidence for intrahepatic biliary dilatation despite the biliary drain. These results were called by telephone at the time of interpretation on 06/11/2018 at 4:27 pm to Dr. Gerlean Ren , who verbally acknowledged these results. Electronically Signed   By: Markus Daft M.D.   On: 06/11/2018 16:29   Ct Angio Chest Pe W/cm &/or Wo Cm  Result Date: 06/09/2018 CLINICAL DATA:  Shortness of breath EXAM: CT ANGIOGRAPHY CHEST CT ABDOMEN AND PELVIS WITH CONTRAST TECHNIQUE: Multidetector CT imaging of the chest was performed using the standard protocol during bolus administration of intravenous contrast. Multiplanar CT image reconstructions and MIPs were obtained to evaluate the vascular anatomy. Multidetector CT imaging of the abdomen and pelvis was performed using the standard protocol  during bolus administration of intravenous contrast. CONTRAST:  178mL ISOVUE-370 IOPAMIDOL (ISOVUE-370) INJECTION 76% COMPARISON:  06/01/2018 abdominal CT FINDINGS: CTA CHEST FINDINGS Cardiovascular: Large volume of acute pulmonary embolism seen branching into the right-sided lobar vessels, occlusive to nearly occlusive, and seen within multiple left lower lobe segmental branches. Abnormal RV to LV ratio of 1.26. No pericardial effusion. No acute systemic arterial finding. Mediastinum/Nodes: Negative for adenopathy. Low-density, likely mucinous material projects through the esophageal hiatus. Lungs/Pleura: No acute finding.  No edema or infarct Musculoskeletal: Remote bilateral rib fractures. Remote bilateral clavicle fracture. No acute osseous finding Review of the MIP images confirms the above findings. Critical Value/emergent results were called by telephone at the time of interpretation on 06/09/2018 at 4:15 pm to Dr. Charlesetta Shanks , who verbally acknowledged these results. CT ABDOMEN and PELVIS FINDINGS Hepatobiliary: Subcapsular low-density masses along the posterior right liver, peritoneal metastatic disease.Internal transhepatic biliary catheter from the right. The tube is well positioned. There is similar intrahepatic bile duct dilatation Pancreas: Gastric mass invades the tail of pancreas. Spleen: Absent. Adrenals/Urinary Tract: Negative adrenals. No hydronephrosis or stone. Unremarkable bladder. Stomach/Bowel: History of gastric cancer correlating with low-density thickening and mucosal discontinuity at the level of the body, extending extra serosal. There is known peritoneal metastatic disease with nodularity and low-density collections or mucinous material. Negative for bowel obstruction Vascular/Lymphatic: No visible iliac vein thrombus. Minimal atherosclerotic calcification . Reproductive:Negative Other: No ascites or pneumoperitoneum. Musculoskeletal: No acute or aggressive finding. Remote  right-sided lumbar transverse process fractures Review of the MIP images confirms the above findings. IMPRESSION: 1. Large volume acute pulmonary embolism with multi lobar clot on the right and multi segment clot on the left. Right heart strain (RV/LV Ratio = 1.2) consistent with at least submassive (intermediate risk) PE. The presence of right heart strain has been associated with an increased risk of morbidity and mortality. Please activate Code PE by paging [phone]. 2. Negative for pulmonary edema or lung infarct. 3. Known gastric cancer with peritoneal metastatic disease. No acute finding in the abdomen. 4. Recent trans hepatic biliary drain up sizing. The tube is in good position. Electronically Signed   By: Monte Fantasia M.D.   On: 06/09/2018 16:23   Ct Abdomen Pelvis W Contrast  Result Date: 06/09/2018 CLINICAL DATA:  Shortness of breath EXAM: CT ANGIOGRAPHY CHEST CT ABDOMEN AND PELVIS WITH CONTRAST TECHNIQUE: Multidetector CT imaging of the chest was performed using the standard protocol during bolus administration of intravenous contrast. Multiplanar CT image reconstructions and MIPs were obtained to evaluate the vascular anatomy. Multidetector CT imaging of the abdomen and pelvis was performed using the standard protocol during bolus administration of intravenous contrast. CONTRAST:  178mL ISOVUE-370 IOPAMIDOL (ISOVUE-370) INJECTION 76% COMPARISON:  06/01/2018 abdominal CT FINDINGS: CTA CHEST FINDINGS Cardiovascular: Large volume of acute pulmonary embolism seen branching into the right-sided lobar vessels, occlusive to nearly occlusive, and seen within multiple left lower lobe segmental branches. Abnormal RV to LV ratio of 1.26. No pericardial effusion. No acute systemic arterial finding. Mediastinum/Nodes: Negative for adenopathy. Low-density, likely mucinous material projects through the esophageal hiatus. Lungs/Pleura: No acute finding.  No edema or infarct Musculoskeletal: Remote  bilateral  rib fractures. Remote bilateral clavicle fracture. No acute osseous finding Review of the MIP images confirms the above findings. Critical Value/emergent results were called by telephone at the time of interpretation on 06/09/2018 at 4:15 pm to Dr. Charlesetta Shanks , who verbally acknowledged these results. CT ABDOMEN and PELVIS FINDINGS Hepatobiliary: Subcapsular low-density masses along the posterior right liver, peritoneal metastatic disease.Internal transhepatic biliary catheter from the right. The tube is well positioned. There is similar intrahepatic bile duct dilatation Pancreas: Gastric mass invades the tail of pancreas. Spleen: Absent. Adrenals/Urinary Tract: Negative adrenals. No hydronephrosis or stone. Unremarkable bladder. Stomach/Bowel: History of gastric cancer correlating with low-density thickening and mucosal discontinuity at the level of the body, extending extra serosal. There is known peritoneal metastatic disease with nodularity and low-density collections or mucinous material. Negative for bowel obstruction Vascular/Lymphatic: No visible iliac vein thrombus. Minimal atherosclerotic calcification . Reproductive:Negative Other: No ascites or pneumoperitoneum. Musculoskeletal: No acute or aggressive finding. Remote right-sided lumbar transverse process fractures Review of the MIP images confirms the above findings. IMPRESSION: 1. Large volume acute pulmonary embolism with multi lobar clot on the right and multi segment clot on the left. Right heart strain (RV/LV Ratio = 1.2) consistent with at least submassive (intermediate risk) PE. The presence of right heart strain has been associated with an increased risk of morbidity and mortality. Please activate Code PE by paging [phone]. 2. Negative for pulmonary edema or lung infarct. 3. Known gastric cancer with peritoneal metastatic disease. No acute finding in the abdomen. 4. Recent trans hepatic biliary drain up sizing. The tube is in good  position. Electronically Signed   By: Monte Fantasia M.D.   On: 06/09/2018 16:23   Dg Chest Port 1 View  Result Date: 06/11/2018 CLINICAL DATA:  Dyspnea EXAM: PORTABLE CHEST 1 VIEW COMPARISON:  CT chest 06/11/2018, chest two-view 06/09/2018 FINDINGS: Cardiac enlargement. Negative for heart failure. Negative for pneumonia or effusion. Mild left lower lobe atelectasis. Port-A-Cath tip in the upper right atrium. Chronic clavicle fractures bilaterally. IMPRESSION: Cardiac enlargement without acute abnormality. Electronically Signed   By: Franchot Gallo M.D.   On: 06/11/2018 16:14    Labs:  CBC: Recent Labs    06/08/18 1504 06/09/18 1254 06/10/18 0454 06/11/18 0447  WBC 12.4* 14.3* 14.0* 10.8*  HGB 11.1* 11.1* 9.7* 9.2*  HCT 33.8* 34.1* 30.4* 29.2*  PLT 479* 530* 439* 473*    COAGS: Recent Labs    05/26/18 1548 05/31/18 0419 06/09/18 1254  INR 1.2* 1.04 1.40  APTT  --   --  31    BMP: Recent Labs    06/08/18 1504 06/09/18 1254 06/10/18 0454 06/11/18 0447  NA 137 138 140 136  K 4.0 3.9 4.2 3.6  CL 95* 97* 101 100  CO2 28 28 31 26   GLUCOSE 143* 148* 112* 108*  BUN 14 17 15 11   CALCIUM 9.1 9.2 8.8* 8.3*  CREATININE 0.91 1.04 0.87 0.72  GFRNONAA >60 >60 >60 >60  GFRAA >60 >60 >60 >60    LIVER FUNCTION TESTS: Recent Labs    06/07/18 1428 06/08/18 1504 06/09/18 1254 06/11/18 0447  BILITOT 9.7* 8.2* 8.2* 6.3*  AST 157* 152* 144* 115*  ALT 173* 158* 153* 112*  ALKPHOS 534* 522* 522* 359*  PROT 7.5 8.2* 8.2* 6.2*  ALBUMIN 4.0 3.2* 3.2* 2.5*    Assessment and Plan: Pt with history of metastatic gastric carcinoma with biliary obstruction and prior I/E biliary drain placement on 05/28/2018 with exchange on 6/20, GERD who  was admitted to [facility] on 6/26 with dyspnea.  He was found to have submassive PE with right-sided heart strain and placed on heparin drip.  Due to worsening shortness of breath and weakness today patient underwent repeat CT angio of chest which revealed  increased pulmonary emboli in the left pulmonary arteries and unchanged large clot burden in right pulmonary arteries.  Slightly increased right heart strain noted.  There continued to be evidence for intrahepatic biliary dilatation despite bili drain.  Bilateral lower extremity venous Doppler studies performed yesterday revealed acute DVT in the right femoral vein, left peroneal veins. Request now received for IVC filter placement. Creat 0.72.  Imaging studies have been reviewed by Dr. Earleen Newport. Risks and benefits discussed with the patient/family including, but not limited to bleeding, infection, contrast induced renal failure, filter fracture or migration which can lead to emergency surgery or even death, strut penetration with damage or irritation to adjacent structures and caval thrombosis.  All of the patient's questions were answered, patient is agreeable to proceed. Consent signed and in chart.  Procedure scheduled for this evening ASAP.    Electronically Signed: D. Rowe Robert, PA-C 06/11/2018, 4:55 PM   I spent a total of 30 minutes at the the patient's bedside AND on the patient's hospital floor or unit, greater than 50% of which was counseling/coordinating care for IVC filter placement    Patient ID: [name], [name]   DOB: Dec 03, 1961, 57 y.o.   MRN: [id number]

## 2018-06-11 NOTE — Progress Notes (Deleted)
SATURATION QUALIFICATIONS: (This note is used to comply with regulatory documentation for home oxygen)  Patient Saturations on Room Air at Rest = 88%  Patient Saturations on Room Air while Ambulating = 86%  Patient Saturations on 2 Liters of oxygen while Ambulating = 90%  Please briefly explain why patient needs home oxygen: Patient ambulated well today doing better off oxygen however still dropping below 88%

## 2018-06-11 NOTE — Progress Notes (Signed)
This RN went in to reassess patient. Patient continues to have work of breathing and shortness of breath. Patient also stated that he just didn't feel the same. This RN placed patient back in bed and continued the 4L of oxygen to maintain saturations at 93-95%. MD notified again and new orders were placed. Patient is to be sent to stepdown once procedure is completed. Will continue to monitor patient closely.

## 2018-06-11 NOTE — Progress Notes (Signed)
[name] is doing okay.  He continues on heparin.  I would keep him on heparin through the weekend.  I would then switch over to Lovenox.  I would put him on twice daily Lovenox.  To no surprise, he does have blood clots in both legs bilaterally.  He still has some shortness of breath when he gets out of bed.  I suspect this probably will be with him for a couple weeks or so until the bulky pulmonary emboli are resolved.  He is eating a little bit better.  Pain control seems to be doing fairly well.  I am encouraged that his bilirubin is trending downward.  Today, the bilirubin was 6.3.  His blood count shows a white cell count 10.8.  Hemoglobin 9.2.  Platelet count 473K.  He is still on cardiac monitor.  I think that he can probably come off cardiac monitor.  He has had no bleeding.  On his physical exam, his vital signs show temperature 98.1.  Pulse 77.  Blood pressure 120/77.  Head neck exam shows no ocular or oral lesions.  He is scleral icterus is much improved.  There is no adenopathy in the neck.  Lungs are clear.  Cardiac exam regular rate and rhythm.  He has 1/6 systolic murmur.  Abdomen is soft.  There is no fluid wave.  He has no palpable spleen tip.  His liver edge is palpable below the right costal margin.  Extremities shows some tenderness to palpation over his calf muscles bilaterally.  He has minimal edema in his legs.  Neurological exam is nonfocal.  [name] has metastatic gastric cancer.  He is hypercoagulable because of this.  Again I would keep him on heparin through the weekend.  I do think that heparin is a great agent for the extensive blood clots that he has.  I think we if we use a heparin, we can get good response and regression.  I would then put him on Lovenox injections twice daily.  I probably would dose him at 90 mg subcu twice daily.  Please try to gradually increase his mobility.  I will be out of town next week.  I will get Dr. Julieanne Manson to  follow him along in my absence.  Hopefully, he will be able to be discharged early next week.  Lattie Haw, MD  [place] 4:13

## 2018-06-11 NOTE — Progress Notes (Signed)
[facility] for heparin drip Indication: pulmonary embolus  Allergies  Allergen Reactions  . Cephalexin Other (See Comments)    "Sore throat" REACTION: SORE THROAT  . Other     Bee sting  . Latex Rash  . Tape Rash    Patient Measurements: Height: '5\' 11"'$  (180.3 cm) Weight: 193 lb 1.6 oz (87.6 kg) IBW/kg (Calculated) : 75.3 Heparin Dosing Weight: 86 kg  Vital Signs:  Temp: 98.1 F (36.7 C) (06/28 0510) Temp Source: Oral (06/28 0510) BP: 120/77 (06/28 0510) Pulse Rate: 77 (06/28 0510)  Labs: Recent Labs    06/09/18 1254 06/09/18 2255 06/10/18 0454 06/11/18 0447  HGB 11.1*  --  9.7* 9.2*  HCT 34.1*  --  30.4* 29.2*  PLT 530*  --  439* 473*  APTT 31  --   --   --   LABPROT 17.0*  --   --   --   INR 1.40  --   --   --   HEPARINUNFRC  --  0.57 0.56 0.52  CREATININE 1.04  --  0.87 0.72    Estimated Creatinine Clearance: 112.4 mL/min (by C-G formula based on SCr of 0.72 mg/dL).   Assessment: Pharmacy consulted to dose and monitor heparin in this 57 year old male diagnosed with PE. 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 Per completed med rec and insurance claims, patient is not on any anticoagulation prior to admission. Baseline labs are WNL.  Today, 06/11/18  Hgb 9.2,  Plt 473 are stable  Daily heparin level =0.52, therapeutic  No bleeding reported  Per MD notes, looks like planning to continue heparin drip through Sunday  Goal of Therapy:  Heparin level 0.3-0.7 units/ml Monitor platelets by anticoagulation protocol: Yes   Plan:   Continue heparin infusion of 1450 units/hr  CBC and HL daily  Monitor patient for any signs/symptoms of bleeding  F/u transition to oral agent  Theodis Shove, PharmD, BCPS Clinical Pharmacist Pager: [phone] 06/11/18 12:42 PM

## 2018-06-11 NOTE — Procedures (Signed)
  Procedure: IVCgram and filter placement (retrievable) EBL:   minimal Complications:  none immediate  See full dictation in [facility] PACS.  D. Travontae Freiberger MD Main # [phone] Pager  [phone]   

## 2018-06-11 NOTE — Progress Notes (Signed)
Patient was ambulating to the bathroom and oxygen became unhooked without knowing. VS and oxygen saturations taken and all VS stable except saturations at 81%. Patient had increased work of breathing and shortness of breath. This RN assessed oxygen tubing and it was not adequately putting out 3L which is what he was currently on. This RN bumped his O2 levels up to 6L and it took about 10 minutes to get his oxygen levels back to a normal state which was 95-97% on 3L. I continued to stay in room for about 30 minutes to assess patient and after about 10 minutes I was able to bump oxygen to 4L which is where he continued to stay at about 93-95%. MD notified and new orders were placed. Will continue to monitor patient closely.

## 2018-06-11 NOTE — Progress Notes (Signed)
MEDICATION-RELATED CONSULT NOTE   IR Procedure Consult - Anticoagulant/Antiplatelet PTA/Inpatient Med List Review by Pharmacist   Procedure: IVC filter placement Completed: 6/28 18:19 Post-Procedural bleeding risk per IR MD assessment:  LOW  Antithrombotic medications on inpatient or PTA profile prior to procedure:   Heparin IV (therapeutic)  Recommended restart time per IR Post-Procedure Guidelines:  4 hours post procedure  Other considerations: Heparin is for submassive PE with right-sided heart strain, with worsening symptoms on 6/28 and increase pulmonary emboli in the left pulmonary arteries.  Dopplers + acute DVT in the right femoral vein, left peroneal veins.  Plan:      Heparin has already been resumed at previous rate 1450 units/hr (not held)  Discussed with Dr. Vernard Gambles, [place] to resume heparin immediately post procedure since IJ stick is fairly low risk.   Monitor for bleeding/complications.  Next HL scheduled for 0500 with AM labs    Gretta Arab PharmD, BCPS Pager [phone] 06/11/2018 7:56 PM

## 2018-06-11 NOTE — Progress Notes (Signed)
PROGRESS NOTE    [name]  GGE:[id number] DOB: 01-Nov-1961 DOA: 06/09/2018 PCP: Ma Hillock, DO   Brief Narrative:  57 y.o. male with medical history significant of adenocarcinoma-gastric, transaminitis/ elevated total bilirubin status post drain placement 6/14, GERD, chronic constipation recently diagnosed of gastric cancer comes back to the hospital with complains of shortness of breath.  Patient was recently here and discharged on June 04, 2018 after being treated for elevated LFTs with percutaneous biliary drain, status post endoscopy with gastric biopsy suggestive of adenocarcinoma and started on FOLFOX on June 19.  He returns to the hospital with complains of shortness of breath and hypoxia.  He was found to have submassive pulmonary embolism with right-sided heart strain therefore admitted on heparin drip.  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  Acute subsegmental/submassive pulmonary embolism with cor pulmonale -Currently on 3 L nasal cannula saturating greater than 95%. -CTA of the chest shows pulmonary embolism with right heart strain - Cont Heparin Drip atleast Until Sunday evening with plans to transition to Lovenox 1 mgkg q12hrs thereafter -LE Dopplers-acute DVT and femoral vein on the right.  Acute DVT in peroneal vein on the left -Echo results -ejection fraction 60 to 46%, grade 1 diastolic dysfunction, mildly dilated right ventricle, mild tricuspid regurgitation, slightly elevated pulmonary arterial pressure - Pain control - Appreciate input from oncology, Dr Minus Breeding adenocarcinoma Transaminitis with mild ductal dilation/elevated total bilirubin -Patient is status post biliary drain placement followed by interventional  radiology.  This was placed on 05/28/18 -Continue to trend LFTs -Management per outpatient oncology  History of GERD -Continue PPI  DVT prophylaxis: On Hep Drip  Code Status: Full  Family Communication: None at bedside  Disposition Plan:  TBD Consults called: Oncology, Dr Marin Olp  Admission status: Maintain inpatient stay as he is a very high risk patient. Needs Inpatient AC with heparin.   Consultants:   Oncology   Procedures:   None  Antimicrobials:   None   Subjective: Still has exertional sob and hypoxia. At rest he is feeling better.   Review of Systems Otherwise negative except as per HPI, including: General = no fevers, chills, dizziness, malaise, fatigue HEENT/EYES = negative for pain, redness, loss of vision, double vision, blurred vision, loss of hearing, sore throat, hoarseness, dysphagia Cardiovascular= negative for chest pain, palpitation, murmurs, lower extremity swelling Respiratory/lungs= negative for  cough, hemoptysis, wheezing, mucus production Gastrointestinal= negative for nausea, vomiting,, abdominal pain, melena, hematemesis Genitourinary= negative for Dysuria, Hematuria, Change in Urinary Frequency MSK = Negative for arthralgia, myalgias, Back Pain, Joint swelling  Neurology= Negative for headache, seizures, numbness, tingling  Psychiatry= Negative for anxiety, depression, suicidal and homocidal ideation Allergy/Immunology= Medication/Food allergy as listed  Skin= Negative for Rash, lesions, ulcers, itching   Objective: Vitals:   06/10/18 0547 06/10/18 1405 06/10/18 2254 06/11/18 0510  BP: 111/69 (!) 116/94 124/82 120/77  Pulse: 73 74 85 77  Resp: 18 18 18 16   Temp: (!) 97.5 F (36.4 C) 98 F (36.7 C) 98.2 F (36.8 C) 98.1 F (36.7 C)  TempSrc: Oral Oral Oral Oral  SpO2: 94% 97% 95% 95%  Weight: 85.2 kg (187 lb 12.8 oz)   87.6 kg (193 lb 1.6 oz)  Height:  Intake/Output Summary (Last 24 hours) at 06/11/2018 1117 Last data  filed at 06/11/2018 1040 Gross per 24 hour  Intake 2709.5 ml  Output 1075 ml  Net 1634.5 ml   Filed Weights   06/09/18 1859 06/10/18 0547 06/11/18 0510  Weight: 84.9 kg (187 lb 1.6 oz) 85.2 kg (187 lb 12.8 oz) 87.6 kg (193 lb 1.6 oz)    Examination:  Constitutional: NAD, calm, comfortable Eyes: PERRL, lids and conjunctivae normal ENMT: Mucous membranes are moist. Posterior pharynx clear of any exudate or lesions.Normal dentition.  Chest wall chemo port noted.  Neck: normal, supple, no masses, no thyromegaly Respiratory: clear to auscultation bilaterally, no wheezing, no crackles. Normal respiratory effort. No accessory muscle use.  Cardiovascular: Regular rate and rhythm, no murmurs / rubs / gallops. No extremity edema. 2+ pedal pulses. No carotid bruits.  Abdomen: no tenderness, no masses palpated. No hepatosplenomegaly. Bowel sounds positive.  Musculoskeletal: no clubbing / cyanosis. No joint deformity upper and lower extremities. Good ROM, no contractures. Normal muscle tone.  Skin: no rashes, lesions, ulcers. No induration Neurologic: CN 2-12 grossly intact. Sensation intact, DTR normal. Strength 5/5 in all 4.  Psychiatric: Normal judgment and insight. Alert and oriented x 3. Normal mood.    Data Reviewed:   CBC: Recent Labs  Lab 06/07/18 1428 06/08/18 1504 06/09/18 1254 06/10/18 0454 06/11/18 0447  WBC 11.7* 12.4* 14.3* 14.0* 10.8*  NEUTROABS 9.4* 10.3* 11.9*  --   --   HGB 11.3* 11.1* 11.1* 9.7* 9.2*  HCT 33.5* 33.8* 34.1* 30.4* 29.2*  MCV 92.4 93.1 94.2 94.7 94.8  PLT 473.0* 479* 530* 439* 093*   Basic Metabolic Panel: Recent Labs  Lab 06/07/18 1428 06/08/18 1504 06/09/18 1254 06/10/18 0454 06/11/18 0447  NA 136 137 138 140 136  K 4.8 4.0 3.9 4.2 3.6  CL 94* 95* 97* 101 100  CO2 29 28 28 31 26   GLUCOSE 125* 143* 148* 112* 108*  BUN 13 14 17 15 11   CREATININE 0.92 0.91 1.04 0.87 0.72  CALCIUM 9.9 9.1 9.2 8.8* 8.3*  MG  --   --   --  2.1 2.0    GFR: Estimated Creatinine Clearance: 112.4 mL/min (by C-G formula based on SCr of 0.72 mg/dL). Liver Function Tests: Recent Labs  Lab 06/07/18 1428 06/08/18 1504 06/09/18 1254 06/11/18 0447  AST 157* 152* 144* 115*  ALT 173* 158* 153* 112*  ALKPHOS 534* 522* 522* 359*  BILITOT 9.7* 8.2* 8.2* 6.3*  PROT 7.5 8.2* 8.2* 6.2*  ALBUMIN 4.0 3.2* 3.2* 2.5*   Recent Labs  Lab 06/09/18 1254  LIPASE 57*   No results for input(s): AMMONIA in the last 168 hours. Coagulation Profile: Recent Labs  Lab 06/09/18 1254  INR 1.40   Cardiac Enzymes: No results for input(s): CKTOTAL, CKMB, CKMBINDEX, TROPONINI in the last 168 hours. BNP (last 3 results) No results for input(s): PROBNP in the last 8760 hours. HbA1C: No results for input(s): HGBA1C in the last 72 hours. CBG: No results for input(s): GLUCAP in the last 168 hours. Lipid Profile: No results for input(s): CHOL, HDL, LDLCALC, TRIG, CHOLHDL, LDLDIRECT in the last 72 hours. Thyroid Function Tests: No results for input(s): TSH, T4TOTAL, FREET4, T3FREE, THYROIDAB in the last 72 hours. Anemia Panel: Recent Labs    06/08/18 1504  FERRITIN 597*  TIBC 314  IRON 23*   Sepsis Labs: Recent Labs  Lab 06/09/18 1316  LATICACIDVEN 1.74    Recent Results (from the past 240 hour(s))  Culture, blood (  routine x 2)     Status: None (Preliminary result)   Collection Time: 06/09/18  1:03 PM  Result Value Ref Range Status   Specimen Description   Final    BLOOD LEFT ANTECUBITAL Performed at [facility] [address], [place], [place] [address]    Special Requests   Final    BOTTLES DRAWN AEROBIC AND ANAEROBIC Blood Culture adequate volume Performed at [facility] [address], [place], [place] [address]    Culture   Final    NO GROWTH 2 DAYS Performed at [facility] [address], [place], [place] [address]    Report Status PENDING  Incomplete  Culture, blood (routine x 2)      Status: None (Preliminary result)   Collection Time: 06/09/18  1:03 PM  Result Value Ref Range Status   Specimen Description   Final    BLOOD PORTA CATH Performed at [facility] [address], [place], [place] [address]    Special Requests   Final    BOTTLES DRAWN AEROBIC ONLY Blood Culture adequate volume Performed at [facility] [address], [place], [place] [address]    Culture   Final    NO GROWTH 2 DAYS Performed at [facility] [address] [place], [place] [address]    Report Status PENDING  Incomplete         Radiology Studies: Dg Chest 2 View  Result Date: 06/09/2018 CLINICAL DATA:  Shortness of breath. EXAM: CHEST - 2 VIEW COMPARISON:  Radiographs of August 25, 2007. CT scan of May 27, 2018. FINDINGS: Stable cardiomediastinal silhouette. No pneumothorax or pleural effusion is noted. Multiple old bilateral rib fractures are noted. Old bilateral clavicular fractures are noted. Right internal jugular Port-A-Cath is noted with distal tip in expected position of cavoatrial junction. IMPRESSION: Multiple old bilateral rib and clavicular fractures. No acute intracranial abnormality seen. Electronically Signed   By: Marijo Conception, M.D.   On: 06/09/2018 14:06   Ct Angio Chest Pe W/cm &/or Wo Cm  Result Date: 06/09/2018 CLINICAL DATA:  Shortness of breath EXAM: CT ANGIOGRAPHY CHEST CT ABDOMEN AND PELVIS WITH CONTRAST TECHNIQUE: Multidetector CT imaging of the chest was performed using the standard protocol during bolus administration of intravenous contrast. Multiplanar CT image reconstructions and MIPs were obtained to evaluate the vascular anatomy. Multidetector CT imaging of the abdomen and pelvis was performed using the standard protocol during bolus administration of intravenous contrast. CONTRAST:  160mL ISOVUE-370 IOPAMIDOL (ISOVUE-370) INJECTION 76% COMPARISON:  06/01/2018 abdominal CT FINDINGS: CTA CHEST FINDINGS  Cardiovascular: Large volume of acute pulmonary embolism seen branching into the right-sided lobar vessels, occlusive to nearly occlusive, and seen within multiple left lower lobe segmental branches. Abnormal RV to LV ratio of 1.26. No pericardial effusion. No acute systemic arterial finding. Mediastinum/Nodes: Negative for adenopathy. Low-density, likely mucinous material projects through the esophageal hiatus. Lungs/Pleura: No acute finding.  No edema or infarct Musculoskeletal: Remote bilateral rib fractures. Remote bilateral clavicle fracture. No acute osseous finding Review of the MIP images confirms the above findings. Critical Value/emergent results were called by telephone at the time of interpretation on 06/09/2018 at 4:15 pm to Dr. Charlesetta Shanks , who verbally acknowledged these results. CT ABDOMEN and PELVIS FINDINGS Hepatobiliary: Subcapsular low-density masses along the posterior right liver, peritoneal metastatic disease.Internal transhepatic biliary catheter from the right. The tube is well positioned. There is similar intrahepatic bile duct dilatation Pancreas: Gastric  mass invades the tail of pancreas. Spleen: Absent. Adrenals/Urinary Tract: Negative adrenals. No hydronephrosis or stone. Unremarkable bladder. Stomach/Bowel: History of gastric cancer correlating with low-density thickening and mucosal discontinuity at the level of the body, extending extra serosal. There is known peritoneal metastatic disease with nodularity and low-density collections or mucinous material. Negative for bowel obstruction Vascular/Lymphatic: No visible iliac vein thrombus. Minimal atherosclerotic calcification . Reproductive:Negative Other: No ascites or pneumoperitoneum. Musculoskeletal: No acute or aggressive finding. Remote right-sided lumbar transverse process fractures Review of the MIP images confirms the above findings. IMPRESSION: 1. Large volume acute pulmonary embolism with multi lobar clot on the right and  multi segment clot on the left. Right heart strain (RV/LV Ratio = 1.2) consistent with at least submassive (intermediate risk) PE. The presence of right heart strain has been associated with an increased risk of morbidity and mortality. Please activate Code PE by paging [phone]. 2. Negative for pulmonary edema or lung infarct. 3. Known gastric cancer with peritoneal metastatic disease. No acute finding in the abdomen. 4. Recent trans hepatic biliary drain up sizing. The tube is in good position. Electronically Signed   By: Monte Fantasia M.D.   On: 06/09/2018 16:23   Ct Abdomen Pelvis W Contrast  Result Date: 06/09/2018 CLINICAL DATA:  Shortness of breath EXAM: CT ANGIOGRAPHY CHEST CT ABDOMEN AND PELVIS WITH CONTRAST TECHNIQUE: Multidetector CT imaging of the chest was performed using the standard protocol during bolus administration of intravenous contrast. Multiplanar CT image reconstructions and MIPs were obtained to evaluate the vascular anatomy. Multidetector CT imaging of the abdomen and pelvis was performed using the standard protocol during bolus administration of intravenous contrast. CONTRAST:  1108mL ISOVUE-370 IOPAMIDOL (ISOVUE-370) INJECTION 76% COMPARISON:  06/01/2018 abdominal CT FINDINGS: CTA CHEST FINDINGS Cardiovascular: Large volume of acute pulmonary embolism seen branching into the right-sided lobar vessels, occlusive to nearly occlusive, and seen within multiple left lower lobe segmental branches. Abnormal RV to LV ratio of 1.26. No pericardial effusion. No acute systemic arterial finding. Mediastinum/Nodes: Negative for adenopathy. Low-density, likely mucinous material projects through the esophageal hiatus. Lungs/Pleura: No acute finding.  No edema or infarct Musculoskeletal: Remote bilateral rib fractures. Remote bilateral clavicle fracture. No acute osseous finding Review of the MIP images confirms the above findings. Critical Value/emergent results were called by telephone at the  time of interpretation on 06/09/2018 at 4:15 pm to Dr. Charlesetta Shanks , who verbally acknowledged these results. CT ABDOMEN and PELVIS FINDINGS Hepatobiliary: Subcapsular low-density masses along the posterior right liver, peritoneal metastatic disease.Internal transhepatic biliary catheter from the right. The tube is well positioned. There is similar intrahepatic bile duct dilatation Pancreas: Gastric mass invades the tail of pancreas. Spleen: Absent. Adrenals/Urinary Tract: Negative adrenals. No hydronephrosis or stone. Unremarkable bladder. Stomach/Bowel: History of gastric cancer correlating with low-density thickening and mucosal discontinuity at the level of the body, extending extra serosal. There is known peritoneal metastatic disease with nodularity and low-density collections or mucinous material. Negative for bowel obstruction Vascular/Lymphatic: No visible iliac vein thrombus. Minimal atherosclerotic calcification . Reproductive:Negative Other: No ascites or pneumoperitoneum. Musculoskeletal: No acute or aggressive finding. Remote right-sided lumbar transverse process fractures Review of the MIP images confirms the above findings. IMPRESSION: 1. Large volume acute pulmonary embolism with multi lobar clot on the right and multi segment clot on the left. Right heart strain (RV/LV Ratio = 1.2) consistent with at least submassive (intermediate risk) PE. The presence of right heart strain has been associated with an increased risk of morbidity and mortality. Please  activate Code PE by paging [phone]. 2. Negative for pulmonary edema or lung infarct. 3. Known gastric cancer with peritoneal metastatic disease. No acute finding in the abdomen. 4. Recent trans hepatic biliary drain up sizing. The tube is in good position. Electronically Signed   By: Monte Fantasia M.D.   On: 06/09/2018 16:23        Scheduled Meds: . dronabinol  5 mg Oral BID AC  . feeding supplement  1 Container Oral BID BM  .  [START ON 06/12/2018] fentaNYL  25 mcg Transdermal Q72H  . fluconazole  200 mg Oral Daily  . loratadine  10 mg Oral Daily  . multivitamin with minerals  1 tablet Oral Daily  . pantoprazole  40 mg Oral Daily  . polyethylene glycol  17 g Oral Daily  . protein supplement shake  11 oz Oral Q24H  . vitamin B-12  1,000 mcg Oral Daily   Continuous Infusions: . sodium chloride 75 mL/hr at 06/09/18 1843  . heparin 1,450 Units/hr (06/11/18 0037)     LOS: 2 days    I have spent 30 minutes face to face with the patient and on the ward discussing the patients care, assessment, plan and disposition with other care givers. >50% of the time was devoted counseling the patient about the risks and benefits of treatment and coordinating care.     Ankit Arsenio Loader, MD [facility] Pager [phone]   If 7PM-7AM, please contact night-coverage [email or web address] Password [facility] 06/11/2018, 11:17 AM

## 2018-06-12 ENCOUNTER — Encounter (HOSPITAL_COMMUNITY): Payer: Self-pay | Admitting: Interventional Radiology

## 2018-06-12 LAB — COMPREHENSIVE METABOLIC PANEL
ALK PHOS: 360 U/L — AB (ref 38–126)
ALT: 113 U/L — AB (ref 0–44)
AST: 114 U/L — AB (ref 15–41)
Albumin: 2.5 g/dL — ABNORMAL LOW (ref 3.5–5.0)
Anion gap: 11 (ref 5–15)
BILIRUBIN TOTAL: 6.6 mg/dL — AB (ref 0.3–1.2)
BUN: 10 mg/dL (ref 6–20)
CALCIUM: 8.5 mg/dL — AB (ref 8.9–10.3)
CHLORIDE: 105 mmol/L (ref 98–111)
CO2: 24 mmol/L (ref 22–32)
CREATININE: 0.73 mg/dL (ref 0.61–1.24)
GFR calc Af Amer: >60 mL/min (ref >60)
Glucose, Bld: 105 mg/dL — ABNORMAL HIGH (ref 70–99)
Potassium: 4.3 mmol/L (ref 3.5–5.1)
Sodium: 140 mmol/L (ref 135–145)
TOTAL PROTEIN: 6.5 g/dL (ref 6.5–8.1)

## 2018-06-12 LAB — MAGNESIUM: Magnesium: 2.1 mg/dL (ref 1.7–2.4)

## 2018-06-12 LAB — CBC
HEMATOCRIT: 29.2 % — AB (ref 39.0–52.0)
HEMOGLOBIN: 9.5 g/dL — AB (ref 13.0–17.0)
MCH: 30.4 pg (ref 26.0–34.0)
MCHC: 32.5 g/dL (ref 30.0–36.0)
MCV: 93.6 fL (ref 78.0–100.0)
Platelets: 499 10*3/uL — ABNORMAL HIGH (ref 150–400)
RBC: 3.12 MIL/uL — ABNORMAL LOW (ref 4.22–5.81)
RDW: 15.4 % (ref 11.5–15.5)
WBC: 9.8 10*3/uL (ref 4.0–10.5)

## 2018-06-12 LAB — HEPARIN LEVEL (UNFRACTIONATED): Heparin Unfractionated: 0.48 IU/mL (ref 0.30–0.70)

## 2018-06-12 MED ORDER — ORAL CARE MOUTH RINSE
15.0000 mL | Freq: Two times a day (BID) | OROMUCOSAL | Status: DC
  Administered 2018-06-12 – 2018-06-16 (×3): 15 mL via OROMUCOSAL

## 2018-06-12 MED ORDER — SODIUM CHLORIDE 0.9% FLUSH
10.0000 mL | INTRAVENOUS | Status: DC | PRN
  Administered 2018-06-16: 10 mL
  Filled 2018-06-12: qty 40

## 2018-06-12 MED ORDER — CHLORHEXIDINE GLUCONATE CLOTH 2 % EX PADS
6.0000 | MEDICATED_PAD | Freq: Every day | CUTANEOUS | Status: DC
  Administered 2018-06-14 – 2018-06-15 (×2): 6 via TOPICAL

## 2018-06-12 MED ORDER — SODIUM CHLORIDE 0.9% FLUSH
10.0000 mL | Freq: Two times a day (BID) | INTRAVENOUS | Status: DC
  Administered 2018-06-12 – 2018-06-16 (×4): 10 mL

## 2018-06-12 NOTE — Progress Notes (Signed)
78mcg fentanyl patch wasted in sharps with Clyda Greener

## 2018-06-12 NOTE — Progress Notes (Signed)
PROGRESS NOTE    ADEDAMOLA SETO  BMW:[id number] DOB: [birth year] DOA: 06/09/2018 PCP: Ma Hillock, DO   Brief Narrative:  57 y.o. male with medical history significant of adenocarcinoma-gastric, transaminitis/ elevated total bilirubin status post drain placement 6/14, GERD, chronic constipation recently diagnosed of gastric cancer comes back to the hospital with complains of shortness of breath.  Patient was recently here and discharged on June 04, 2018 after being treated for elevated LFTs with percutaneous biliary drain, status post endoscopy with gastric biopsy suggestive of adenocarcinoma and started on FOLFOX on June 19.  He returns to the hospital with complains of shortness of breath and hypoxia.  He was found to have submassive pulmonary embolism with right-sided heart strain therefore admitted on heparin drip.  Patient had worsening of shortness of breath on 6/28 with concern of progression of his clot therefore CT of the chest was done which confirmed progression of his pulmonary embolism with increasing right heart strain.  Interventional radiology was consulted and IVC filter was placed.  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  Acute subsegmental/submassive pulmonary embolism with cor pulmonale; persist -This morning patient is on 3 L nasal cannula saturating greater than 95% - Initial CT a of the chest showed pulmonary embolism with right heart strain but had a follow-up CAT scan due to worsening of breathing symptoms on 6/28 which showed progression of his pulmonary embolism with increasing right heart strain -Continue heparin drip.  Status post IVC filter placement on 6/28 - Echocardiogram done on 6/27 showed ejection fraction 60-65% with grade 1  diastolic dysfunction. -Lower extremity Doppler is positive for bilateral DVT -Case was discussed with oncology and interventional radiology yesterday. -Pain control  Gastro adenocarcinoma Transaminitis with mild ductal dilation/elevated total bilirubin -Patient is status post biliary drain placement followed by interventional radiology.  This was placed on 05/28/18 -Continue to trend LFTs -Management per outpatient oncology  History of GERD -Continue PPI  DVT prophylaxis: On Hep Drip  Code Status: Full  Family Communication: None at bedside  Disposition Plan:  TBD Consults called: Oncology, Dr Marin Olp; IR Admission status:  Maintain patient inpatient for IV heparin Consultants:   Oncology   Interventional radiology  Procedures:   IVC filter placement 6/28  Antimicrobials:   None   Subjective: Feels a little better this morning.  Denies any shortness of breath at rest.  Review of Systems Otherwise negative except as per HPI, including: General = no fevers, chills, dizziness, malaise, fatigue HEENT/EYES = negative for pain, redness, loss of vision, double vision, blurred vision, loss of hearing, sore throat, hoarseness, dysphagia Cardiovascular= negative for chest pain, palpitation, murmurs, lower extremity swelling Respiratory/lungs= negative for shortness of breath, cough, hemoptysis, wheezing, mucus production Gastrointestinal= negative for nausea, vomiting,, abdominal pain, melena, hematemesis Genitourinary= negative for Dysuria, Hematuria, Change in Urinary Frequency MSK = Negative for arthralgia, myalgias, Back Pain, Joint swelling  Neurology= Negative for headache, seizures, numbness, tingling  Psychiatry= Negative for anxiety, depression, suicidal and homocidal ideation Allergy/Immunology= Medication/Food allergy as listed  Skin= Negative for Rash, lesions, ulcers, itching    Objective: Vitals:   06/12/18 0800 06/12/18 0900 06/12/18 1000 06/12/18 1100    BP: 105/73 108/72 112/72 112/74  Pulse: 78 78 79 74  Resp: (!) 22 20 (!)  28 (!) 21  Temp:      TempSrc:      SpO2: 98% 97% 97% 96%  Weight:      Height:        Intake/Output Summary (Last 24 hours) at 06/12/2018 1110 Last data filed at 06/12/2018 1100 Gross per 24 hour  Intake 2473.88 ml  Output 550 ml  Net 1923.88 ml   Filed Weights   06/09/18 1859 06/10/18 0547 06/11/18 0510  Weight: 84.9 kg (187 lb 1.6 oz) 85.2 kg (187 lb 12.8 oz) 87.6 kg (193 lb 1.6 oz)    Examination: Constitutional: NAD, calm, comfortable; on3 L [employer].  Eyes: PERRL, lids and conjunctivae normal ENMT: Mucous membranes are moist. Posterior pharynx clear of any exudate or lesions.Normal dentition.  Neck: normal, supple, no masses, no thyromegaly Respiratory: Diminished breath sounds of the bases.  Chemo-Port in place Cardiovascular: Regular rate and rhythm, no murmurs / rubs / gallops. No extremity edema. 2+ pedal pulses. No carotid bruits.  Abdomen: no tenderness, no masses palpated. No hepatosplenomegaly. Bowel sounds positive.  Musculoskeletal: no clubbing / cyanosis. No joint deformity upper and lower extremities. Good ROM, no contractures. Normal muscle tone.  Skin: no rashes, lesions, ulcers. No induration Neurologic: CN 2-12 grossly intact. Sensation intact, DTR normal. Strength 5/5 in all 4.  Psychiatric: Normal judgment and insight. Alert and oriented x 3. Normal mood.    Data Reviewed:   CBC: Recent Labs  Lab 06/07/18 1428 06/08/18 1504 06/09/18 1254 06/10/18 0454 06/11/18 0447 06/12/18 0315  WBC 11.7* 12.4* 14.3* 14.0* 10.8* 9.8  NEUTROABS 9.4* 10.3* 11.9*  --   --   --   HGB 11.3* 11.1* 11.1* 9.7* 9.2* 9.5*  HCT 33.5* 33.8* 34.1* 30.4* 29.2* 29.2*  MCV 92.4 93.1 94.2 94.7 94.8 93.6  PLT 473.0* 479* 530* 439* 473* 174*   Basic Metabolic Panel: Recent Labs  Lab 06/08/18 1504 06/09/18 1254 06/10/18 0454 06/11/18 0447 06/12/18 0315  NA 137 138 140 136 140  K 4.0 3.9 4.2 3.6 4.3   CL 95* 97* 101 100 105  CO2 28 28 31 26 24   GLUCOSE 143* 148* 112* 108* 105*  BUN 14 17 15 11 10   CREATININE 0.91 1.04 0.87 0.72 0.73  CALCIUM 9.1 9.2 8.8* 8.3* 8.5*  MG  --   --  2.1 2.0 2.1   GFR: Estimated Creatinine Clearance: 112.4 mL/min (by C-G formula based on SCr of 0.73 mg/dL). Liver Function Tests: Recent Labs  Lab 06/07/18 1428 06/08/18 1504 06/09/18 1254 06/11/18 0447 06/12/18 0315  AST 157* 152* 144* 115* 114*  ALT 173* 158* 153* 112* 113*  ALKPHOS 534* 522* 522* 359* 360*  BILITOT 9.7* 8.2* 8.2* 6.3* 6.6*  PROT 7.5 8.2* 8.2* 6.2* 6.5  ALBUMIN 4.0 3.2* 3.2* 2.5* 2.5*   Recent Labs  Lab 06/09/18 1254  LIPASE 57*   No results for input(s): AMMONIA in the last 168 hours. Coagulation Profile: Recent Labs  Lab 06/09/18 1254  INR 1.40   Cardiac Enzymes: No results for input(s): CKTOTAL, CKMB, CKMBINDEX, TROPONINI in the last 168 hours. BNP (last 3 results) No results for input(s): PROBNP in the last 8760 hours. HbA1C: No results for input(s): HGBA1C in the last 72 hours. CBG: No results for input(s): GLUCAP in the last 168 hours. Lipid Profile: No results for input(s): CHOL, HDL, LDLCALC, TRIG, CHOLHDL, LDLDIRECT in the last 72 hours. Thyroid Function Tests: No results for input(s): TSH, T4TOTAL, FREET4, T3FREE, THYROIDAB in the last 72 hours. Anemia Panel:  No results for input(s): VITAMINB12, FOLATE, FERRITIN, TIBC, IRON, RETICCTPCT in the last 72 hours. Sepsis Labs: Recent Labs  Lab 06/09/18 1316  LATICACIDVEN 1.74    Recent Results (from the past 240 hour(s))  Culture, blood (routine x 2)     Status: None (Preliminary result)   Collection Time: 06/09/18  1:03 PM  Result Value Ref Range Status   Specimen Description   Final    BLOOD LEFT ANTECUBITAL Performed at [facility] [address] [place], [place] [address]    Special Requests   Final    BOTTLES DRAWN AEROBIC AND ANAEROBIC Blood Culture adequate  volume Performed at [facility] [address] [place], [place] [address]    Culture   Final    NO GROWTH 3 DAYS Performed at [facility], [place] [address] [place], [place] [address]    Report Status PENDING  Incomplete  Culture, blood (routine x 2)     Status: None (Preliminary result)   Collection Time: 06/09/18  1:03 PM  Result Value Ref Range Status   Specimen Description   Final    BLOOD PORTA CATH Performed at [facility] [address], [place], [place] [address]    Special Requests   Final    BOTTLES DRAWN AEROBIC ONLY Blood Culture adequate volume Performed at [facility] [address], [place], [place] [address]    Culture   Final    NO GROWTH 3 DAYS Performed at [facility], [place] [address] [place], [place] [address]    Report Status PENDING  Incomplete  MRSA PCR Screening     Status: None   Collection Time: 06/11/18  6:50 PM  Result Value Ref Range Status   MRSA by PCR NEGATIVE NEGATIVE Final    Comment:        The GeneXpert MRSA Assay (FDA approved for NASAL specimens only), is one component of a comprehensive MRSA colonization surveillance program. It is not intended to diagnose MRSA infection nor to guide or monitor treatment for MRSA infections. Performed at [facility], [place] [address] [place],  [address]          Radiology Studies: Ct Angio Chest Pe W Or Wo Contrast  Result Date: 06/11/2018 CLINICAL DATA:  57 year old with metastatic gastric cancer and pulmonary emboli. Worsening shortness of breath and weakness. EXAM: CT ANGIOGRAPHY CHEST WITH CONTRAST TECHNIQUE: Multidetector CT imaging of the chest was performed using the standard protocol during bolus administration of intravenous contrast. Multiplanar CT image reconstructions and MIPs were obtained to evaluate the vascular anatomy. CONTRAST:  120mL ISOVUE-370 IOPAMIDOL (ISOVUE-370) INJECTION  76% COMPARISON:  Chest CTA 06/09/2018. FINDINGS: Cardiovascular: There is new clot in the distal left pulmonary artery. There is increased clot burden in the main left lower lobe pulmonary artery and left lower lobe segmental branches. There is increased clot involving the left upper lobe segmental branches. The large clot burden in the right pulmonary arteries has not significantly changed. There continues to be clot in the distal main right pulmonary artery and extending into the right upper and lower pulmonary arteries. Right ventricle continues to be enlarged and there continues to be evidence for right heart strain. The RV: LV ratio has slightly increased, now measuring close to 1.4. No significant pericardial fluid. Mediastinum/Nodes: Visualized thyroid tissue is unremarkable. Right jugular Port-A-Cath is present. The tip is probably near the cavoatrial junction but difficult to characterize due to motion  artifact. Again noted is large amount of fluid in the distal paraesophageal region which is extending up from the abdomen. No significant chest lymphadenopathy. Lungs/Pleura: The trachea and mainstem bronchi are patent. Again noted is atelectasis or scarring in the lingula. There is increased atelectasis or trace pleural fluid at the left lung base. No large areas of lung consolidation or airspace disease. Upper Abdomen: Patient has an internal/external biliary drain from a right hepatic approach. There is a small amount of perihepatic ascites which is slightly hyperdense. Again noted is a large low-density structure along the posterior right hepatic lobe in the subcapsular region. Again noted are numerous low-density collections throughout the gastrohepatic ligament region and surrounding the stomach and anterior aspect of the pancreas. There is evidence for intrahepatic biliary dilatation despite the percutaneous drain. Evidence for peritoneal disease in the upper abdomen and increased stranding around left  adrenal gland. Musculoskeletal: Multiple old rib fractures. Old clavicle fractures. Review of the MIP images confirms the above findings. IMPRESSION: Increased pulmonary emboli in the left pulmonary arteries. The large clot burden in the right pulmonary arteries has not significantly changed. Slightly increased right heart strain based on the RV : LV ratio. No significant changes in the upper abdomen with respect to the metastatic cancer and biliary drain. There continues to be evidence for intrahepatic biliary dilatation despite the biliary drain. These results were called by telephone at the time of interpretation on 06/11/2018 at 4:27 pm to Dr. Gerlean Ren , who verbally acknowledged these results. Electronically Signed   By: Markus Daft M.D.   On: 06/11/2018 16:29   Ir Ivc Filter Plmt / S&i /img Guid/mod Sed  Result Date: 06/12/2018 CLINICAL DATA:  Recurrent pulmonary emboli despite therapeutic anticoagulation. Caval filtration requested. EXAM: INFERIOR VENACAVOGRAM IVC FILTER PLACEMENT UNDER FLUOROSCOPY FLUOROSCOPY TIME:  54 seconds, 283 mGy TECHNIQUE: The procedure, risks (including but not limited to bleeding, infection, organ damage ), benefits, and alternatives were explained to the patient. Questions regarding the procedure were encouraged and answered. The patient understands and consents to the procedure. Patency of the right IJ vein was confirmed with ultrasound with image documentation. An appropriate skin site was determined. Skin site was marked, prepped with chlorhexidine, and draped using maximum barrier technique. The region was infiltrated locally with 1% lidocaine. Intravenous Fentanyl and Versed were administered as conscious sedation during continuous monitoring of the patient's level of consciousness and physiological / cardiorespiratory status by the radiology RN, with a total moderate sedation time of 20 minutes. Under real-time ultrasound guidance, the right IJ vein was accessed with a 21  gauge micropuncture needle; the needle tip within the vein was confirmed with ultrasound image documentation. The needle was exchanged over a 018 guidewire for a transitional dilator, which allow advancement of the [facility] wire into the IVC. A long 6 French vascular sheath was placed for inferior venacavography. This demonstrated no caval thrombus. Renal vein inflows were evident. The [facility] IVC filter was advanced through the sheath and successfully deployed under fluoroscopy at the L2 level. Followup cavagram demonstrates stable filter position and no evident complication. The sheath was removed and hemostasis achieved at the site. No immediate complication. IMPRESSION: 1. Normal IVC. No thrombus or significant anatomic variation. 2. Technically successful infrarenal IVC filter placement. This is a retrievable model. PLAN: This IVC filter is potentially retrievable. The patient will be assessed for filter retrieval by Interventional Radiology in approximately 8-12 weeks. Further recommendations regarding filter retrieval, continued surveillance or declaration of device permanence, will be  made at that time. Electronically Signed   By: Lucrezia Europe M.D.   On: 06/12/2018 07:53   Dg Chest Port 1 View  Result Date: 06/11/2018 CLINICAL DATA:  Dyspnea EXAM: PORTABLE CHEST 1 VIEW COMPARISON:  CT chest 06/11/2018, chest two-view 06/09/2018 FINDINGS: Cardiac enlargement. Negative for heart failure. Negative for pneumonia or effusion. Mild left lower lobe atelectasis. Port-A-Cath tip in the upper right atrium. Chronic clavicle fractures bilaterally. IMPRESSION: Cardiac enlargement without acute abnormality. Electronically Signed   By: Franchot Gallo M.D.   On: 06/11/2018 16:14        Scheduled Meds: . Chlorhexidine Gluconate Cloth  6 each Topical Daily  . dronabinol  5 mg Oral BID AC  . feeding supplement  1 Container Oral BID BM  . fentaNYL  25 mcg Transdermal Q72H  . fluconazole  200 mg Oral Daily  .  loratadine  10 mg Oral Daily  . mouth rinse  15 mL Mouth Rinse BID  . multivitamin with minerals  1 tablet Oral Daily  . pantoprazole  40 mg Oral Daily  . polyethylene glycol  17 g Oral Daily  . protein supplement shake  11 oz Oral Q24H  . sodium chloride flush  10-40 mL Intracatheter Q12H  . vitamin B-12  1,000 mcg Oral Daily   Continuous Infusions: . sodium chloride 75 mL/hr at 06/12/18 0600  . heparin 1,450 Units/hr (06/12/18 1044)     LOS: 3 days    I have spent 35 minutes face to face with the patient and on the ward discussing the patients care, assessment, plan and disposition with other care givers. >50% of the time was devoted counseling the patient about the risks and benefits of treatment and coordinating care.     Madolin Twaddle Arsenio Loader, MD [facility] Pager [phone]   If 7PM-7AM, please contact night-coverage [email or web address] Password [email or web address] 06/12/2018, 11:10 AM

## 2018-06-12 NOTE — Progress Notes (Addendum)
[facility] for heparin drip Indication: pulmonary embolus  Allergies  Allergen Reactions  . Cephalexin Other (See Comments)    "Sore throat" REACTION: SORE THROAT  . Other     Bee sting  . Latex Rash  . Tape Rash    Patient Measurements: Height: '5\' 11"'$  (180.3 cm) Weight: 193 lb 1.6 oz (87.6 kg) IBW/kg (Calculated) : 75.3 Heparin Dosing Weight: 86 kg  Vital Signs:  Temp: 98 F (36.7 C) (06/29 0431) Temp Source: Oral (06/29 0431) BP: 115/75 (06/29 0600) Pulse Rate: 76 (06/29 0600)  Labs: Recent Labs    06/09/18 1254  06/10/18 0454 06/11/18 0447 06/12/18 0315  HGB 11.1*  --  9.7* 9.2* 9.5*  HCT 34.1*  --  30.4* 29.2* 29.2*  PLT 530*  --  439* 473* 499*  APTT 31  --   --   --   --   LABPROT 17.0*  --   --   --   --   INR 1.40  --   --   --   --   HEPARINUNFRC  --    < > 0.56 0.52 0.48  CREATININE 1.04  --  0.87 0.72 0.73   < > = values in this interval not displayed.    Estimated Creatinine Clearance: 112.4 mL/min (by C-G formula based on SCr of 0.73 mg/dL).   Assessment: Pharmacy consulted to dose and monitor heparin in this 57 year old male diagnosed with PE. 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 Per completed med rec and insurance claims, patient is not on any anticoagulation prior to admission. Baseline labs are WNL.  Today, 06/12/18   Heparin level therapeutic on 1450 units/hr  Heparin off for only ~84mn on 6/28 for IVC filter placement. Filter placed for extension of clot  Per Onc note, recommends continuing heparin through the weekend  Hgb 9.5,  Plt WNL  No bleeding reported  Goal of Therapy:  Heparin level 0.3-0.7 units/ml Monitor platelets by anticoagulation protocol: Yes   Plan:   Continue heparin infusion of 1450 units/hr  CBC and HL daily  Monitor patient for any signs/symptoms of bleeding  F/u transition to  LMWH - Onc recommends heparin through the weekend  DDoreene Eland PharmD, BCPS.   Pager: [phone]/29/2019 7:30 AM

## 2018-06-13 LAB — CBC
HCT: 29.4 % — ABNORMAL LOW (ref 39.0–52.0)
Hemoglobin: 9.4 g/dL — ABNORMAL LOW (ref 13.0–17.0)
MCH: 29.9 pg (ref 26.0–34.0)
MCHC: 32 g/dL (ref 30.0–36.0)
MCV: 93.6 fL (ref 78.0–100.0)
PLATELETS: 522 10*3/uL — AB (ref 150–400)
RBC: 3.14 MIL/uL — ABNORMAL LOW (ref 4.22–5.81)
RDW: 15.5 % (ref 11.5–15.5)
WBC: 10.5 10*3/uL (ref 4.0–10.5)

## 2018-06-13 LAB — COMPREHENSIVE METABOLIC PANEL WITH GFR
ALT: 102 U/L — ABNORMAL HIGH (ref 0–44)
AST: 107 U/L — ABNORMAL HIGH (ref 15–41)
Albumin: 2.6 g/dL — ABNORMAL LOW (ref 3.5–5.0)
Alkaline Phosphatase: 383 U/L — ABNORMAL HIGH (ref 38–126)
Anion gap: 8 (ref 5–15)
BUN: 11 mg/dL (ref 6–20)
CO2: 28 mmol/L (ref 22–32)
Calcium: 8.6 mg/dL — ABNORMAL LOW (ref 8.9–10.3)
Chloride: 102 mmol/L (ref 98–111)
Creatinine, Ser: 0.89 mg/dL (ref 0.61–1.24)
GFR calc Af Amer: >60 mL/min
GFR calc non Af Amer: >60 mL/min
Glucose, Bld: 106 mg/dL — ABNORMAL HIGH (ref 70–99)
Potassium: 4.5 mmol/L (ref 3.5–5.1)
Sodium: 138 mmol/L (ref 135–145)
Total Bilirubin: 6.3 mg/dL — ABNORMAL HIGH (ref 0.3–1.2)
Total Protein: 6.6 g/dL (ref 6.5–8.1)

## 2018-06-13 LAB — MAGNESIUM: Magnesium: 2.1 mg/dL (ref 1.7–2.4)

## 2018-06-13 LAB — HEPARIN LEVEL (UNFRACTIONATED): Heparin Unfractionated: 0.46 [IU]/mL (ref 0.30–0.70)

## 2018-06-13 NOTE — Progress Notes (Signed)
PROGRESS NOTE    [name]  KPT:[id number] DOB: 1961-02-18 DOA: 06/09/2018 PCP: Ma Hillock, DO   Brief Narrative:  57 y.o. male with medical history significant of adenocarcinoma-gastric, transaminitis/ elevated total bilirubin status post drain placement 6/14, GERD, chronic constipation recently diagnosed of gastric cancer comes back to the hospital with complains of shortness of breath.  Patient was recently here and discharged on June 04, 2018 after being treated for elevated LFTs with percutaneous biliary drain, status post endoscopy with gastric biopsy suggestive of adenocarcinoma and started on FOLFOX on June 19.  He returns to the hospital with complains of shortness of breath and hypoxia.  He was found to have submassive pulmonary embolism with right-sided heart strain therefore admitted on heparin drip.  Patient had worsening of shortness of breath on 6/28 with concern of progression of his clot therefore CT of the chest was done which confirmed progression of his pulmonary embolism with increasing right heart strain.  Interventional radiology was consulted and IVC filter was placed.  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  Acute subsegmental/submassive pulmonary embolism with cor pulmonale; persist - Patient currently remained stable on 3 L nasal cannula saturating between 90-95%.  Does still get quite exertional shortness of breath. - Initial CT a of the chest showed pulmonary embolism with right heart strain but had a follow-up CAT scan due to worsening of breathing symptoms on 6/28 which showed progression of his pulmonary embolism with increasing right heart strain -Continue heparin drip- will switch over the lovenox in next 24-48 hrs.   Status post IVC filter placement on 6/28 - Echocardiogram done on 6/27 showed ejection fraction 60-65% with grade 1 diastolic dysfunction. -Lower extremity Doppler is positive for bilateral DVT -Case was discussed with oncology and interventional radiology- appreciate input.  -Pain control  Gastro adenocarcinoma Transaminitis with mild ductal dilation/elevated total bilirubin -Biliary drain placed on 6/14.  Currently is draining well.  Continue to trend his LFTs.  Follow-up with outpatient oncology for this.  History of GERD -Continue PPI  DVT prophylaxis: On Hep Drip  Code Status: Full  Family Communication: None at bedside  Disposition Plan:  TBD Consults called: Oncology, IR Admission status:  Maintain inpatient stay  Consultants:   Oncology   Interventional radiology  Procedures:   IVC filter placement 6/28  Antimicrobials:   None   Subjective: Still has quite a bit of exertional shortness of breath.  Despite of being on 3 L nasal cannula when he speaks in long sentences his saturation drops down to 87%.  Review of Systems Otherwise negative except as per HPI, including: General = no fevers, chills, dizziness, malaise, fatigue HEENT/EYES = negative for pain, redness, loss of vision, double vision, blurred vision, loss of hearing, sore throat, hoarseness, dysphagia Cardiovascular= negative for chest pain, palpitation, murmurs, lower extremity swelling Respiratory/lungs= negative for  hemoptysis, wheezing, mucus production Gastrointestinal= negative for nausea, vomiting,, abdominal pain, melena, hematemesis Genitourinary= negative for Dysuria, Hematuria, Change in Urinary Frequency MSK = Negative for arthralgia, myalgias, Back Pain, Joint swelling  Neurology= Negative for headache, seizures, numbness, tingling  Psychiatry= Negative for anxiety, depression, suicidal and homocidal ideation Allergy/Immunology= Medication/Food allergy as listed  Skin= Negative for  Rash, lesions, ulcers, itching   Objective: Vitals:  06/13/18 0806 06/13/18 0834 06/13/18 0900 06/13/18 1000  BP:  124/78    Pulse:  73 74 77  Resp:  (!) 21 18 14   Temp: 98.3 F (36.8 C)     TempSrc: Oral     SpO2:  97% 95% 93%  Weight:      Height:        Intake/Output Summary (Last 24 hours) at 06/13/2018 1116 Last data filed at 06/13/2018 1000 Gross per 24 hour  Intake 1366 ml  Output 800 ml  Net 566 ml   Filed Weights   06/10/18 0547 06/11/18 0510 06/13/18 0500  Weight: 85.2 kg (187 lb 12.8 oz) 87.6 kg (193 lb 1.6 oz) 92 kg (202 lb 13.2 oz)    Examination: Constitutional: NAD, calm, comfortable; on 3 L nasal cannula Eyes: PERRL, lids and conjunctivae normal ENMT: Mucous membranes are moist. Posterior pharynx clear of any exudate or lesions.Normal dentition.  Neck: normal, supple, no masses, no thyromegaly Respiratory: clear to auscultation bilaterally, no wheezing, no crackles. Normal respiratory effort. No accessory muscle use.  Cardiovascular: Regular rate and rhythm, no murmurs / rubs / gallops. No extremity edema. 2+ pedal pulses. No carotid bruits.  Abdomen: no tenderness, no masses palpated. No hepatosplenomegaly. Bowel sounds positive.  Biliary drain is in place draining biliary content. Musculoskeletal: no clubbing / cyanosis. No joint deformity upper and lower extremities. Good ROM, no contractures. Normal muscle tone.  Skin: no rashes, lesions, ulcers. No induration Neurologic: CN 2-12 grossly intact. Sensation intact, DTR normal. Strength 5/5 in all 4.  Psychiatric: Normal judgment and insight. Alert and oriented x 3. Normal mood.    Data Reviewed:   CBC: Recent Labs  Lab 06/07/18 1428 06/08/18 1504 06/09/18 1254 06/10/18 0454 06/11/18 0447 06/12/18 0315 06/13/18 0341  WBC 11.7* 12.4* 14.3* 14.0* 10.8* 9.8 10.5  NEUTROABS 9.4* 10.3* 11.9*  --   --   --   --   HGB 11.3* 11.1* 11.1* 9.7* 9.2* 9.5* 9.4*  HCT 33.5* 33.8* 34.1* 30.4* 29.2* 29.2*  29.4*  MCV 92.4 93.1 94.2 94.7 94.8 93.6 93.6  PLT 473.0* 479* 530* 439* 473* 499* 505*   Basic Metabolic Panel: Recent Labs  Lab 06/09/18 1254 06/10/18 0454 06/11/18 0447 06/12/18 0315 06/13/18 0341  NA 138 140 136 140 138  K 3.9 4.2 3.6 4.3 4.5  CL 97* 101 100 105 102  CO2 28 31 26 24 28   GLUCOSE 148* 112* 108* 105* 106*  BUN 17 15 11 10 11   CREATININE 1.04 0.87 0.72 0.73 0.89  CALCIUM 9.2 8.8* 8.3* 8.5* 8.6*  MG  --  2.1 2.0 2.1 2.1   GFR: Estimated Creatinine Clearance: 110 mL/min (by C-G formula based on SCr of 0.89 mg/dL). Liver Function Tests: Recent Labs  Lab 06/08/18 1504 06/09/18 1254 06/11/18 0447 06/12/18 0315 06/13/18 0341  AST 152* 144* 115* 114* 107*  ALT 158* 153* 112* 113* 102*  ALKPHOS 522* 522* 359* 360* 383*  BILITOT 8.2* 8.2* 6.3* 6.6* 6.3*  PROT 8.2* 8.2* 6.2* 6.5 6.6  ALBUMIN 3.2* 3.2* 2.5* 2.5* 2.6*   Recent Labs  Lab 06/09/18 1254  LIPASE 57*   No results for input(s): AMMONIA in the last 168 hours. Coagulation Profile: Recent Labs  Lab 06/09/18 1254  INR 1.40   Cardiac Enzymes: No results for input(s): CKTOTAL, CKMB, CKMBINDEX, TROPONINI in the last 168 hours. BNP (last 3 results) No results for input(s): PROBNP in the last 8760 hours. HbA1C: No results for input(s): HGBA1C in the  last 72 hours. CBG: No results for input(s): GLUCAP in the last 168 hours. Lipid Profile: No results for input(s): CHOL, HDL, LDLCALC, TRIG, CHOLHDL, LDLDIRECT in the last 72 hours. Thyroid Function Tests: No results for input(s): TSH, T4TOTAL, FREET4, T3FREE, THYROIDAB in the last 72 hours. Anemia Panel: No results for input(s): VITAMINB12, FOLATE, FERRITIN, TIBC, IRON, RETICCTPCT in the last 72 hours. Sepsis Labs: Recent Labs  Lab 06/09/18 1316  LATICACIDVEN 1.74    Recent Results (from the past 240 hour(s))  Culture, blood (routine x 2)     Status: None (Preliminary result)   Collection Time: 06/09/18  1:03 PM  Result Value Ref Range  Status   Specimen Description   Final    BLOOD LEFT ANTECUBITAL Performed at [facility] [address] [place], [place] [address]    Special Requests   Final    BOTTLES DRAWN AEROBIC AND ANAEROBIC Blood Culture adequate volume Performed at [facility] [address], [place], [place] [address]    Culture   Final    NO GROWTH 3 DAYS Performed at [facility], [place] [address], [place], [place] [address]    Report Status PENDING  Incomplete  Culture, blood (routine x 2)     Status: None (Preliminary result)   Collection Time: 06/09/18  1:03 PM  Result Value Ref Range Status   Specimen Description   Final    BLOOD PORTA CATH Performed at [facility] [address], [place], [place] [address]    Special Requests   Final    BOTTLES DRAWN AEROBIC ONLY Blood Culture adequate volume Performed at [facility] [address] [place], [place] [address]    Culture   Final    NO GROWTH 3 DAYS Performed at [facility], [place] [address] [place], [place] [address]    Report Status PENDING  Incomplete  MRSA PCR Screening     Status: None   Collection Time: 06/11/18  6:50 PM  Result Value Ref Range Status   MRSA by PCR NEGATIVE NEGATIVE Final    Comment:        The GeneXpert MRSA Assay (FDA approved for NASAL specimens only), is one component of a comprehensive MRSA colonization surveillance program. It is not intended to diagnose MRSA infection nor to guide or monitor treatment for MRSA infections. Performed at [facility], [place] [address], [place], [place] [address]          Radiology Studies: Ct Angio Chest Pe W Or Wo Contrast  Result Date: 06/11/2018 CLINICAL DATA:  57 year old with metastatic gastric cancer and pulmonary emboli. Worsening shortness of breath and weakness. EXAM: CT ANGIOGRAPHY CHEST WITH CONTRAST TECHNIQUE: Multidetector CT imaging of the chest  was performed using the standard protocol during bolus administration of intravenous contrast. Multiplanar CT image reconstructions and MIPs were obtained to evaluate the vascular anatomy. CONTRAST:  122mL ISOVUE-370 IOPAMIDOL (ISOVUE-370) INJECTION 76% COMPARISON:  Chest CTA 06/09/2018. FINDINGS: Cardiovascular: There is new clot in the distal left pulmonary artery. There is increased clot burden in the main left lower lobe pulmonary artery and left lower lobe segmental branches. There is increased clot involving the left upper lobe segmental branches. The large clot burden in the right pulmonary arteries has not significantly changed. There continues to be clot in the distal main right pulmonary artery and extending into the right upper and lower pulmonary arteries. Right ventricle continues to be enlarged and there  continues to be evidence for right heart strain. The RV: LV ratio has slightly increased, now measuring close to 1.4. No significant pericardial fluid. Mediastinum/Nodes: Visualized thyroid tissue is unremarkable. Right jugular Port-A-Cath is present. The tip is probably near the cavoatrial junction but difficult to characterize due to motion artifact. Again noted is large amount of fluid in the distal paraesophageal region which is extending up from the abdomen. No significant chest lymphadenopathy. Lungs/Pleura: The trachea and mainstem bronchi are patent. Again noted is atelectasis or scarring in the lingula. There is increased atelectasis or trace pleural fluid at the left lung base. No large areas of lung consolidation or airspace disease. Upper Abdomen: Patient has an internal/external biliary drain from a right hepatic approach. There is a small amount of perihepatic ascites which is slightly hyperdense. Again noted is a large low-density structure along the posterior right hepatic lobe in the subcapsular region. Again noted are numerous low-density collections throughout the gastrohepatic  ligament region and surrounding the stomach and anterior aspect of the pancreas. There is evidence for intrahepatic biliary dilatation despite the percutaneous drain. Evidence for peritoneal disease in the upper abdomen and increased stranding around left adrenal gland. Musculoskeletal: Multiple old rib fractures. Old clavicle fractures. Review of the MIP images confirms the above findings. IMPRESSION: Increased pulmonary emboli in the left pulmonary arteries. The large clot burden in the right pulmonary arteries has not significantly changed. Slightly increased right heart strain based on the RV : LV ratio. No significant changes in the upper abdomen with respect to the metastatic cancer and biliary drain. There continues to be evidence for intrahepatic biliary dilatation despite the biliary drain. These results were called by telephone at the time of interpretation on 06/11/2018 at 4:27 pm to Dr. Gerlean Ren , who verbally acknowledged these results. Electronically Signed   By: Markus Daft M.D.   On: 06/11/2018 16:29   Ir Ivc Filter Plmt / S&i /img Guid/mod Sed  Result Date: 06/12/2018 CLINICAL DATA:  Recurrent pulmonary emboli despite therapeutic anticoagulation. Caval filtration requested. EXAM: INFERIOR VENACAVOGRAM IVC FILTER PLACEMENT UNDER FLUOROSCOPY FLUOROSCOPY TIME:  54 seconds, 283 mGy TECHNIQUE: The procedure, risks (including but not limited to bleeding, infection, organ damage ), benefits, and alternatives were explained to the patient. Questions regarding the procedure were encouraged and answered. The patient understands and consents to the procedure. Patency of the right IJ vein was confirmed with ultrasound with image documentation. An appropriate skin site was determined. Skin site was marked, prepped with chlorhexidine, and draped using maximum barrier technique. The region was infiltrated locally with 1% lidocaine. Intravenous Fentanyl and Versed were administered as conscious sedation during  continuous monitoring of the patient's level of consciousness and physiological / cardiorespiratory status by the radiology RN, with a total moderate sedation time of 20 minutes. Under real-time ultrasound guidance, the right IJ vein was accessed with a 21 gauge micropuncture needle; the needle tip within the vein was confirmed with ultrasound image documentation. The needle was exchanged over a 018 guidewire for a transitional dilator, which allow advancement of the [facility] wire into the IVC. A long 6 French vascular sheath was placed for inferior venacavography. This demonstrated no caval thrombus. Renal vein inflows were evident. The [facility] IVC filter was advanced through the sheath and successfully deployed under fluoroscopy at the L2 level. Followup cavagram demonstrates stable filter position and no evident complication. The sheath was removed and hemostasis achieved at the site. No immediate complication. IMPRESSION: 1. Normal IVC. No thrombus or significant  anatomic variation. 2. Technically successful infrarenal IVC filter placement. This is a retrievable model. PLAN: This IVC filter is potentially retrievable. The patient will be assessed for filter retrieval by Interventional Radiology in approximately 8-12 weeks. Further recommendations regarding filter retrieval, continued surveillance or declaration of device permanence, will be made at that time. Electronically Signed   By: Lucrezia Europe M.D.   On: 06/12/2018 07:53   Dg Chest Port 1 View  Result Date: 06/11/2018 CLINICAL DATA:  Dyspnea EXAM: PORTABLE CHEST 1 VIEW COMPARISON:  CT chest 06/11/2018, chest two-view 06/09/2018 FINDINGS: Cardiac enlargement. Negative for heart failure. Negative for pneumonia or effusion. Mild left lower lobe atelectasis. Port-A-Cath tip in the upper right atrium. Chronic clavicle fractures bilaterally. IMPRESSION: Cardiac enlargement without acute abnormality. Electronically Signed   By: Franchot Gallo M.D.   On: 06/11/2018  16:14        Scheduled Meds: . Chlorhexidine Gluconate Cloth  6 each Topical Daily  . dronabinol  5 mg Oral BID AC  . feeding supplement  1 Container Oral BID BM  . fentaNYL  25 mcg Transdermal Q72H  . fluconazole  200 mg Oral Daily  . loratadine  10 mg Oral Daily  . mouth rinse  15 mL Mouth Rinse BID  . multivitamin with minerals  1 tablet Oral Daily  . pantoprazole  40 mg Oral Daily  . polyethylene glycol  17 g Oral Daily  . protein supplement shake  11 oz Oral Q24H  . sodium chloride flush  10-40 mL Intracatheter Q12H  . vitamin B-12  1,000 mcg Oral Daily   Continuous Infusions: . heparin 1,450 Units/hr (06/13/18 0640)     LOS: 4 days    I have spent 25 minutes face to face with the patient and on the ward discussing the patients care, assessment, plan and disposition with other care givers. >50% of the time was devoted counseling the patient about the risks and benefits of treatment and coordinating care.     Lealon Vanputten Arsenio Loader, MD [facility] Pager [phone]   If 7PM-7AM, please contact night-coverage [email or web address] Password [email or web address] 06/13/2018, 11:16 AM

## 2018-06-13 NOTE — Progress Notes (Signed)
ANTICOAGULATION CONSULT NOTE  Pharmacy Consult for heparin drip Indication: pulmonary embolus  Allergies  Allergen Reactions  . Cephalexin Other (See Comments)    "Sore throat" REACTION: SORE THROAT  . Other     Bee sting  . Latex Rash  . Tape Rash    Patient Measurements: Height: 5' 11" (180.3 cm) Weight: 202 lb 13.2 oz (92 kg) IBW/kg (Calculated) : 75.3 Heparin Dosing Weight: 86 kg  Vital Signs:  Temp: 97.8 F (36.6 C) (06/30 0349) Temp Source: Oral (06/30 0349) BP: 115/67 (06/30 0600) Pulse Rate: 73 (06/30 0600)  Labs: Recent Labs    06/11/18 0447 06/12/18 0315 06/13/18 0341  HGB 9.2* 9.5* 9.4*  HCT 29.2* 29.2* 29.4*  PLT 473* 499* 522*  HEPARINUNFRC 0.52 0.48 0.46  CREATININE 0.72 0.73 0.89    Estimated Creatinine Clearance: 110 mL/min (by C-G formula based on SCr of 0.89 mg/dL).   Assessment: Pharmacy consulted to dose and monitor heparin in this 56 year old male diagnosed with PE. 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 Per completed med rec and insurance claims, patient is not on any anticoagulation prior to admission. Baseline labs are WNL.  Today, 06/13/18   Heparin level therapeutic on 1450 units/hr  Heparin off for only ~15min on 6/28 for IVC filter placement. Filter placed for extension of clot  Per Onc note, recommends continuing heparin through the weekend and change to PO LMWH 7/1  Hgb 9.4,  Plt WNL  No bleeding reported  Goal of Therapy:  Heparin level 0.3-0.7 units/ml Monitor platelets by anticoagulation protocol: Yes   Plan:   Continue heparin infusion of 1450 units/hr  CBC and HL daily  Monitor patient for any signs/symptoms of bleeding  F/u transition to LMWH - Onc recommends heparin through the weekend  Dustin Zeigler, PharmD, BCPS.   Pager: [phone] 06/13/2018 7:24 AM       

## 2018-06-14 DIAGNOSIS — Z85820 Personal history of malignant melanoma of skin: Secondary | ICD-10-CM | POA: Diagnosis not present

## 2018-06-14 DIAGNOSIS — C799 Secondary malignant neoplasm of unspecified site: Secondary | ICD-10-CM | POA: Diagnosis present

## 2018-06-14 DIAGNOSIS — R740 Nonspecific elevation of levels of transaminase and lactic acid dehydrogenase [LDH]: Secondary | ICD-10-CM | POA: Diagnosis present

## 2018-06-14 DIAGNOSIS — I82411 Acute embolism and thrombosis of right femoral vein: Secondary | ICD-10-CM | POA: Diagnosis present

## 2018-06-14 DIAGNOSIS — Z79899 Other long term (current) drug therapy: Secondary | ICD-10-CM | POA: Diagnosis not present

## 2018-06-14 DIAGNOSIS — Z7901 Long term (current) use of anticoagulants: Secondary | ICD-10-CM

## 2018-06-14 DIAGNOSIS — R188 Other ascites: Secondary | ICD-10-CM | POA: Diagnosis present

## 2018-06-14 DIAGNOSIS — E440 Moderate protein-calorie malnutrition: Secondary | ICD-10-CM | POA: Diagnosis present

## 2018-06-14 DIAGNOSIS — C786 Secondary malignant neoplasm of retroperitoneum and peritoneum: Secondary | ICD-10-CM | POA: Diagnosis not present

## 2018-06-14 DIAGNOSIS — N529 Male erectile dysfunction, unspecified: Secondary | ICD-10-CM | POA: Diagnosis present

## 2018-06-14 DIAGNOSIS — K831 Obstruction of bile duct: Secondary | ICD-10-CM

## 2018-06-14 DIAGNOSIS — G893 Neoplasm related pain (acute) (chronic): Secondary | ICD-10-CM

## 2018-06-14 DIAGNOSIS — R0902 Hypoxemia: Secondary | ICD-10-CM | POA: Diagnosis present

## 2018-06-14 DIAGNOSIS — R109 Unspecified abdominal pain: Secondary | ICD-10-CM | POA: Diagnosis present

## 2018-06-14 DIAGNOSIS — R0789 Other chest pain: Secondary | ICD-10-CM | POA: Diagnosis present

## 2018-06-14 DIAGNOSIS — Z85828 Personal history of other malignant neoplasm of skin: Secondary | ICD-10-CM | POA: Diagnosis not present

## 2018-06-14 DIAGNOSIS — G8929 Other chronic pain: Secondary | ICD-10-CM | POA: Diagnosis present

## 2018-06-14 DIAGNOSIS — I82499 Acute embolism and thrombosis of other specified deep vein of unspecified lower extremity: Secondary | ICD-10-CM | POA: Diagnosis present

## 2018-06-14 DIAGNOSIS — Z91048 Other nonmedicinal substance allergy status: Secondary | ICD-10-CM | POA: Diagnosis not present

## 2018-06-14 DIAGNOSIS — I2609 Other pulmonary embolism with acute cor pulmonale: Secondary | ICD-10-CM | POA: Diagnosis present

## 2018-06-14 DIAGNOSIS — R0603 Acute respiratory distress: Secondary | ICD-10-CM | POA: Diagnosis present

## 2018-06-14 DIAGNOSIS — Z91040 Latex allergy status: Secondary | ICD-10-CM | POA: Diagnosis not present

## 2018-06-14 DIAGNOSIS — I2699 Other pulmonary embolism without acute cor pulmonale: Secondary | ICD-10-CM | POA: Diagnosis not present

## 2018-06-14 DIAGNOSIS — Z6826 Body mass index (BMI) 26.0-26.9, adult: Secondary | ICD-10-CM | POA: Diagnosis not present

## 2018-06-14 DIAGNOSIS — I82403 Acute embolism and thrombosis of unspecified deep veins of lower extremity, bilateral: Secondary | ICD-10-CM | POA: Diagnosis not present

## 2018-06-14 DIAGNOSIS — C259 Malignant neoplasm of pancreas, unspecified: Secondary | ICD-10-CM | POA: Diagnosis present

## 2018-06-14 DIAGNOSIS — C169 Malignant neoplasm of stomach, unspecified: Secondary | ICD-10-CM | POA: Diagnosis present

## 2018-06-14 DIAGNOSIS — Z881 Allergy status to other antibiotic agents status: Secondary | ICD-10-CM | POA: Diagnosis not present

## 2018-06-14 DIAGNOSIS — Z87891 Personal history of nicotine dependence: Secondary | ICD-10-CM | POA: Diagnosis not present

## 2018-06-14 DIAGNOSIS — K210 Gastro-esophageal reflux disease with esophagitis: Secondary | ICD-10-CM | POA: Diagnosis present

## 2018-06-14 LAB — COMPREHENSIVE METABOLIC PANEL
ALT: 106 U/L — ABNORMAL HIGH (ref 0–44)
ANION GAP: 12 (ref 5–15)
AST: 122 U/L — ABNORMAL HIGH (ref 15–41)
Albumin: 2.7 g/dL — ABNORMAL LOW (ref 3.5–5.0)
Alkaline Phosphatase: 437 U/L — ABNORMAL HIGH (ref 38–126)
BUN: 8 mg/dL (ref 6–20)
CALCIUM: 8.6 mg/dL — AB (ref 8.9–10.3)
CHLORIDE: 96 mmol/L — AB (ref 98–111)
CO2: 27 mmol/L (ref 22–32)
CREATININE: 0.77 mg/dL (ref 0.61–1.24)
Glucose, Bld: 109 mg/dL — ABNORMAL HIGH (ref 70–99)
Potassium: 3.9 mmol/L (ref 3.5–5.1)
SODIUM: 135 mmol/L (ref 135–145)
Total Bilirubin: 6.1 mg/dL — ABNORMAL HIGH (ref 0.3–1.2)
Total Protein: 7 g/dL (ref 6.5–8.1)

## 2018-06-14 LAB — CULTURE, BLOOD (ROUTINE X 2)
CULTURE: NO GROWTH
Culture: NO GROWTH
Special Requests: ADEQUATE
Special Requests: ADEQUATE

## 2018-06-14 LAB — CBC
HCT: 31.6 % — ABNORMAL LOW (ref 39.0–52.0)
Hemoglobin: 10.1 g/dL — ABNORMAL LOW (ref 13.0–17.0)
MCH: 30 pg (ref 26.0–34.0)
MCHC: 32 g/dL (ref 30.0–36.0)
MCV: 93.8 fL (ref 78.0–100.0)
PLATELETS: 533 10*3/uL — AB (ref 150–400)
RBC: 3.37 MIL/uL — ABNORMAL LOW (ref 4.22–5.81)
RDW: 15.6 % — AB (ref 11.5–15.5)
WBC: 9.7 10*3/uL (ref 4.0–10.5)

## 2018-06-14 LAB — MAGNESIUM: Magnesium: 2.1 mg/dL (ref 1.7–2.4)

## 2018-06-14 LAB — HEPARIN LEVEL (UNFRACTIONATED): HEPARIN UNFRACTIONATED: 0.36 [IU]/mL (ref 0.30–0.70)

## 2018-06-15 LAB — HEPARIN LEVEL (UNFRACTIONATED): Heparin Unfractionated: 0.4 IU/mL (ref 0.30–0.70)

## 2018-06-15 MED ORDER — ENOXAPARIN (LOVENOX) PATIENT EDUCATION KIT
PACK | Freq: Once | Status: AC
  Administered 2018-06-15: 10:00:00
  Filled 2018-06-15: qty 1

## 2018-06-15 MED ORDER — ENOXAPARIN SODIUM 100 MG/ML ~~LOC~~ SOLN: 1.0000 mg/kg | Freq: Two times a day (BID) | SUBCUTANEOUS | 0 refills | Status: AC

## 2018-06-15 MED ORDER — ENOXAPARIN SODIUM 100 MG/ML ~~LOC~~ SOLN
1.0000 mg/kg | Freq: Two times a day (BID) | SUBCUTANEOUS | Status: DC
  Administered 2018-06-15 – 2018-06-16 (×3): 90 mg via SUBCUTANEOUS
  Filled 2018-06-15 (×3): qty 1

## 2018-06-15 NOTE — Progress Notes (Signed)
PROGRESS NOTE    [name]  UKG:[id number] DOB: 1961/01/29 DOA: 06/09/2018 PCP: Ma Hillock, DO   Brief Narrative:  57 y.o. male with medical history significant of adenocarcinoma-gastric, transaminitis/ elevated total bilirubin status post drain placement 6/14, GERD, chronic constipation recently diagnosed of gastric cancer comes back to the hospital with complains of shortness of breath.  Patient was recently here and discharged on June 04, 2018 after being treated for elevated LFTs with percutaneous biliary drain, status post endoscopy with gastric biopsy suggestive of adenocarcinoma and started on FOLFOX on June 19.  He returns to the hospital with complains of shortness of breath and hypoxia.  He was found to have submassive pulmonary embolism with right-sided heart strain therefore admitted on heparin drip.  Patient had worsening of shortness of breath on 6/28 with concern of progression of his clot therefore CT of the chest was done which confirmed progression of his pulmonary embolism with increasing right heart strain.  Interventional radiology was consulted and IVC filter was placed.  He was monitored in the stepdown unit for 2-3 days and was transition out to telemetry.  Heparin drip stopped on 7/2 transition to Lovenox.  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  We will try and wean him off.  Plan is to ambulate him more today in the hallway and see how he does. - Initial CT a of the chest showed pulmonary embolism with right heart strain but had a follow-up CAT scan due to worsening of breathing symptoms  on 6/28 which showed progression of his pulmonary embolism with increasing right heart strain -We will transition patient from heparin to Lovenox today.  Status post IVC filter placement on 6/28 - Echocardiogram done on 6/27 showed ejection fraction 60-65% with grade 1 diastolic dysfunction. -Lower extremity Doppler is positive for bilateral DVT -Pain control  Gastro adenocarcinoma Transaminitis with mild ductal dilation/elevated total bilirubin -Biliary drain is in place since 6/14.  Continue to trend LFTs.  He will need a follow-up cholangiogram on 7/26 per IR  History of GERD -Continue PPI  DVT prophylaxis:  switched to therapeutic dose of Lovenox today Code Status: Full  Family Communication: None at bedside  Disposition Plan:  TBD Consults called: Oncology, IR Admission status:  Maintain inpatient stay for another 24-48 hours.  Needs to ambulate more and see how he does.  High risk for clot progression and migration therefore we will watch him closely before he can be discharged.  Consultants:   Oncology   Interventional radiology  Procedures:   IVC filter placement 6/28  Antimicrobials:   None   Subjective: No complaints at rest, he has not tried walking around and would like to do this today.  No acute events overnight.  Review of Systems Otherwise negative except as per HPI, including: General = no fevers, chills, dizziness, malaise, fatigue HEENT/EYES = negative for pain, redness, loss of vision, double vision, blurred vision, loss of hearing, sore throat, hoarseness, dysphagia Cardiovascular= negative for chest pain, palpitation, murmurs, lower extremity swelling Respiratory/lungs= negative for shortness of breath, cough, hemoptysis, wheezing, mucus production Gastrointestinal= negative for nausea, vomiting,, abdominal pain, melena, hematemesis Genitourinary=  negative for Dysuria, Hematuria, Change in Urinary Frequency MSK = Negative for arthralgia, myalgias,  Back Pain, Joint swelling  Neurology= Negative for headache, seizures, numbness, tingling  Psychiatry= Negative for anxiety, depression, suicidal and homocidal ideation Allergy/Immunology= Medication/Food allergy as listed  Skin= Negative for Rash, lesions, ulcers, itching     Objective: Vitals:   25-Jun-2018 1614 June 25, 2018 1852 06/25/2018 2147 06/15/18 0601  BP: 123/79 114/89 117/79 120/84  Pulse: 80 77 69 68  Resp: (!) 30 16  15   Temp:  99.4 F (37.4 C) 97.8 F (36.6 C) 97.6 F (36.4 C)  TempSrc:  Oral Oral Oral  SpO2: 96% (!) 88% 98% 99%  Weight:    88.4 kg (194 lb 14.2 oz)  Height:        Intake/Output Summary (Last 24 hours) at 06/15/2018 1057 Last data filed at 06/15/2018 0700 Gross per 24 hour  Intake 538 ml  Output 1150 ml  Net -612 ml   Filed Weights   06/13/18 0500 2018/06/25 0500 06/15/18 0601  Weight: 92 kg (202 lb 13.2 oz) 92.2 kg (203 lb 4.2 oz) 88.4 kg (194 lb 14.2 oz)    Examination: Constitutional: NAD, calm, comfortable; on 3L [place] Eyes: PERRL, lids and conjunctivae normal; scleral icterus  ENMT: Mucous membranes are moist. Posterior pharynx clear of any exudate or lesions.Normal dentition.  Neck: normal, supple, no masses, no thyromegaly Respiratory: clear to auscultation bilaterally, no wheezing, no crackles. Normal respiratory effort. No accessory muscle use.  Cardiovascular: Regular rate and rhythm, no murmurs / rubs / gallops. No extremity edema. 2+ pedal pulses. No carotid bruits.  Abdomen: no tenderness, no masses palpated. No hepatosplenomegaly. Bowel sounds positive.  Musculoskeletal: no clubbing / cyanosis. No joint deformity upper and lower extremities. Good ROM, no contractures. Normal muscle tone.  Skin: no rashes, lesions, ulcers. No induration Neurologic: CN 2-12 grossly intact. Sensation intact, DTR normal. Strength 5/5 in all 4.  Psychiatric: Normal judgment and insight. Alert and oriented x 3. Normal mood.   Data Reviewed:   CBC: Recent Labs    Lab 06/08/18 1504  06/09/18 1254 06/10/18 0454 06/11/18 0447 06/12/18 0315 06/13/18 0341 25-Jun-2018 0758  WBC 12.4*   < > 14.3* 14.0* 10.8* 9.8 10.5 9.7  NEUTROABS 10.3*  --  11.9*  --   --   --   --   --   HGB 11.1*   < > 11.1* 9.7* 9.2* 9.5* 9.4* 10.1*  HCT 33.8*  --  34.1* 30.4* 29.2* 29.2* 29.4* 31.6*  MCV 93.1  --  94.2 94.7 94.8 93.6 93.6 93.8  PLT 479*   < > 530* 439* 473* 499* 522* 533*   < > = values in this interval not displayed.   Basic Metabolic Panel: Recent Labs  Lab 06/10/18 0454 06/11/18 0447 06/12/18 0315 06/13/18 0341 06/25/18 0758  NA 140 136 140 138 135  K 4.2 3.6 4.3 4.5 3.9  CL 101 100 105 102 96*  CO2 31 26 24 28 27   GLUCOSE 112* 108* 105* 106* 109*  BUN 15 11 10 11 8   CREATININE 0.87 0.72 0.73 0.89 0.77  CALCIUM 8.8* 8.3* 8.5* 8.6* 8.6*  MG 2.1 2.0 2.1 2.1 2.1   GFR: Estimated Creatinine Clearance: 112.4 mL/min (by C-G formula based on SCr of 0.77 mg/dL). Liver Function Tests: Recent Labs  Lab 06/09/18 1254 06/11/18 0447 06/12/18 0315 06/13/18 0341 Jun 25, 2018 0758  AST 144* 115* 114* 107* 122*  ALT 153* 112* 113* 102* 106*  ALKPHOS 522* 359* 360*  383* 437*  BILITOT 8.2* 6.3* 6.6* 6.3* 6.1*  PROT 8.2* 6.2* 6.5 6.6 7.0  ALBUMIN 3.2* 2.5* 2.5* 2.6* 2.7*   Recent Labs  Lab 06/09/18 1254  LIPASE 57*   No results for input(s): AMMONIA in the last 168 hours. Coagulation Profile: Recent Labs  Lab 06/09/18 1254  INR 1.40   Cardiac Enzymes: No results for input(s): CKTOTAL, CKMB, CKMBINDEX, TROPONINI in the last 168 hours. BNP (last 3 results) No results for input(s): PROBNP in the last 8760 hours. HbA1C: No results for input(s): HGBA1C in the last 72 hours. CBG: No results for input(s): GLUCAP in the last 168 hours. Lipid Profile: No results for input(s): CHOL, HDL, LDLCALC, TRIG, CHOLHDL, LDLDIRECT in the last 72 hours. Thyroid Function Tests: No results for input(s): TSH, T4TOTAL, FREET4, T3FREE, THYROIDAB in the last 72  hours. Anemia Panel: No results for input(s): VITAMINB12, FOLATE, FERRITIN, TIBC, IRON, RETICCTPCT in the last 72 hours. Sepsis Labs: Recent Labs  Lab 06/09/18 1316  LATICACIDVEN 1.74    Recent Results (from the past 240 hour(s))  Culture, blood (routine x 2)     Status: None   Collection Time: 06/09/18  1:03 PM  Result Value Ref Range Status   Specimen Description   Final    BLOOD LEFT ANTECUBITAL Performed at [facility] [address] [place], [place] [address]    Special Requests   Final    BOTTLES DRAWN AEROBIC AND ANAEROBIC Blood Culture adequate volume Performed at [facility] [address], [place], [place] [address]    Culture   Final    NO GROWTH 5 DAYS Performed at [facility], [place] [address] [place], [place] [address]    Report Status 06-17-18 FINAL  Final  Culture, blood (routine x 2)     Status: None   Collection Time: 06/09/18  1:03 PM  Result Value Ref Range Status   Specimen Description   Final    BLOOD PORTA CATH Performed at [facility] [address], [place], [place] [address]    Special Requests   Final    BOTTLES DRAWN AEROBIC ONLY Blood Culture adequate volume Performed at [facility] [address] [place], [place] [address]    Culture   Final    NO GROWTH 5 DAYS Performed at [facility], [place] [address], [place], [place] [address]    Report Status 06/17/18 FINAL  Final  MRSA PCR Screening     Status: None   Collection Time: 06/11/18  6:50 PM  Result Value Ref Range Status   MRSA by PCR NEGATIVE NEGATIVE Final    Comment:        The GeneXpert MRSA Assay (FDA approved for NASAL specimens only), is one component of a comprehensive MRSA colonization surveillance program. It is not intended to diagnose MRSA infection nor to guide or monitor treatment for MRSA infections. Performed at [facility], [place] [address] [place], [place] [address]   Body fluid culture     Status: None (Preliminary result)   Collection Time: 06/17/2018  3:45 PM  Result Value Ref Range Status   Specimen Description   Final    BILE Performed at [facility], [place] [address], [place], [place] [address]    Special Requests   Final    Normal Performed at [facility], [place] [address] [place], [place] [address]    Gram Stain  Final    FEW WBC PRESENT, PREDOMINANTLY MONONUCLEAR ABUNDANT GRAM POSITIVE COCCI ABUNDANT GRAM NEGATIVE RODS ABUNDANT GRAM VARIABLE ROD    Culture   Final    CULTURE REINCUBATED FOR BETTER GROWTH Performed at [facility], [place] [address] [place], [place] [address]    Report Status PENDING  Incomplete         Radiology Studies: No results found.      Scheduled Meds: . Chlorhexidine Gluconate Cloth  6 each Topical Daily  . dronabinol  5 mg Oral BID AC  . enoxaparin (LOVENOX) injection  1 mg/kg Subcutaneous Q12H  . feeding supplement  1 Container Oral BID BM  . fentaNYL  25 mcg Transdermal Q72H  . fluconazole  200 mg Oral Daily  . loratadine  10 mg Oral Daily  . mouth rinse  15 mL Mouth Rinse BID  . multivitamin with minerals  1 tablet Oral Daily  . pantoprazole  40 mg Oral Daily  . polyethylene glycol  17 g Oral Daily  . protein supplement shake  11 oz Oral Q24H  . sodium chloride flush  10-40 mL Intracatheter Q12H  . vitamin B-12  1,000 mcg Oral Daily   Continuous Infusions:    LOS: 6 days    I have spent 25 minutes face to face with the patient and on the ward discussing the patients care, assessment, plan and disposition with other care givers. >50% of the time was devoted counseling the patient about the risks and benefits of treatment and coordinating care.     Opal Mckellips Arsenio Loader, MD [facility] Pager [phone]   If 7PM-7AM, please contact night-coverage [email or web address] Password [facility] 06/15/2018, 10:57 AM

## 2018-06-15 NOTE — Progress Notes (Signed)
Pt. Ambulated on 4L [place] became sob, sated 88-92%. Patient needed a break and increased Oxygen to 6L to finish ambulating. Pt. Resting and back on 4L.

## 2018-06-15 NOTE — Progress Notes (Signed)
Nutrition Follow-up  DOCUMENTATION CODES:   Non-severe (moderate) malnutrition in context of chronic illness  INTERVENTION:  - Will d/c Boost Breeze and Premier Protein per pt request. - Continue to encourage PO intakes. - Recommend patient to have 20-30 minutes of uninterrupted time when he receives meal trays.  NUTRITION DIAGNOSIS:   Moderate Malnutrition related to chronic illness, catabolic illness, cancer and cancer related treatments as evidenced by mild fat depletion, mild muscle depletion. -revised, ongoing  GOAL:   Patient will meet greater than or equal to 90% of their needs -unmet  MONITOR:   PO intake, Weight trends, Labs  ASSESSMENT:   57 y.o. male with medical history significant of recently diagnosed gastric adenocarcinoma, transaminitis/elevated total bilirubin status post drain placement 05/28/18, GERD, and chronic constipation. Patient was recently here and discharged on 6/21 after being treated for elevated LFTs with percutaneous biliary drain, status post endoscopy with gastric biopsy suggestive of adenocarcinoma and started on FOLFOX on 6/19.  He returns to the hospital with complains of shortness of breath and hypoxia.  He was found to have submassive pulmonary embolism with right-sided heart strain and was admitted on heparin drip.  Per chart review, weight has fluctuated throughout admission. NFPE outlined below. No intakes documented within the past few days. Patient reports that he was unable to eat breakfast today d/t frequent staff member visits and that this has happened at many meals. He did not attempt to order lunch but was receptive to a cup of orange sherbet which RD provided from the nourishment room on the floor.   He denies nausea but states he has a very full feeling and abdominal pressure/tightness. He is unsure of which items settle better on his stomach than others and denies liquids being easier to consume than solid foods when he has this  sensation. He has tried ordered ONS but does not like them and would prefer not to get any ONS at this time. Noted that Marinol was started on 6/27.  Per Dr. Latina Craver note this AM: acute respiratory distress with repeat CT on 6/28 showing progression of pulmonary embolism with increasing R heart strain, s/p IVC filter on 6/28, will need follow-up cholangiogram on 7/26 2/2 gastric adenocarcinoma with transaminitis, mild ductal dilation, and elevated total bilirubin.    Medications reviewed; 5 mg Marinol BID, daily multivitamin with minerals, 1 packet Miralax/day, 1000 mcg oral vitamin B12/day.  Labs reviewed; Cl: 96 mmol/L, Ca: 8.6 mg/dL, Alk Phos elevated and trending up, LFTs elevated.     Diet Order:   Diet Order           Diet regular Room service appropriate? Yes; Fluid consistency: Thin  Diet effective now          EDUCATION NEEDS:   No education needs have been identified at this time  Skin:  Skin Assessment: Reviewed RN Assessment  Last BM:  6/30  Height:   Ht Readings from Last 1 Encounters:  06/09/18 '5\' 11"'$  (1.803 m)    Weight:   Wt Readings from Last 1 Encounters:  06/15/18 194 lb 14.2 oz (88.4 kg)    Ideal Body Weight:  78.18 kg  BMI:  Body mass index is 27.18 kg/m.  Estimated Nutritional Needs:   Kcal:  2536-6440  Protein:  125-135 grams  Fluid:  >/= 2 L/day     Jon Matin, MS, RD, LDN, [facility] Inpatient Clinical Dietitian Pager # [phone] After hours/weekend pager # [phone]

## 2018-06-15 NOTE — Progress Notes (Signed)
[place] for heparin drip >> enoxaparin Indication: pulmonary embolus  Allergies  Allergen Reactions  . Cephalexin Other (See Comments)    "Sore throat" REACTION: SORE THROAT  . Other     Bee sting  . Latex Rash  . Tape Rash    Patient Measurements: Height: '5\' 11"'$  (180.3 cm) Weight: 194 lb 14.2 oz (88.4 kg) IBW/kg (Calculated) : 75.3 Heparin Dosing Weight: 86 kg  Vital Signs:  Temp: 97.6 F (36.4 C) (07/02 0601) Temp Source: Oral (07/02 0601) BP: 120/84 (07/02 0601) Pulse Rate: 68 (07/02 0601)  Labs: Recent Labs    06/13/18 0341 2018/07/12 0758 06/15/18 0410  HGB 9.4* 10.1*  --   HCT 29.4* 31.6*  --   PLT 522* 533*  --   HEPARINUNFRC 0.46 0.36 0.40  CREATININE 0.89 0.77  --     Estimated Creatinine Clearance: 112.4 mL/min (by C-G formula based on SCr of 0.77 mg/dL).   Assessment: Pharmacy consulted to dose and monitor heparin in this 57 year old male diagnosed with PE. 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 Per completed med rec and insurance claims, patient is not on any anticoagulation prior to admission. Baseline labs are WNL.  Today, 06/15/18   Heparin level therapeutic this AM  MD switching to therapeutic lovenox today  CBC stable as of yesterday  No bleeding reported  CrCl > 90 ml/min  Goal of Therapy:  Heparin level 0.3-0.7 units/ml Monitor platelets by anticoagulation protocol: Yes   Plan:   Discontinue heparin at 0900  Start Lovenox 90 mg (1 mg/kg) SQ q12 hr within an hour after stopping heparin  Monitor renal function and for s/s bleeding  Monitor patient for any signs/symptoms of bleeding  Education provided by RN; patient to discharge on Lovenox soon  Reuel Boom, PharmD, BCPS [phone] 06/15/2018, 12:06 PM

## 2018-06-16 DIAGNOSIS — E440 Moderate protein-calorie malnutrition: Secondary | ICD-10-CM

## 2018-06-16 LAB — COMPREHENSIVE METABOLIC PANEL
ALK PHOS: 406 U/L — AB (ref 38–126)
ALT: 102 U/L — ABNORMAL HIGH (ref 0–44)
AST: 115 U/L — AB (ref 15–41)
Albumin: 2.6 g/dL — ABNORMAL LOW (ref 3.5–5.0)
Anion gap: 10 (ref 5–15)
BILIRUBIN TOTAL: 5.9 mg/dL — AB (ref 0.3–1.2)
BUN: 7 mg/dL (ref 6–20)
CALCIUM: 8.7 mg/dL — AB (ref 8.9–10.3)
CO2: 29 mmol/L (ref 22–32)
CREATININE: 0.74 mg/dL (ref 0.61–1.24)
Chloride: 100 mmol/L (ref 98–111)
Glucose, Bld: 115 mg/dL — ABNORMAL HIGH (ref 70–99)
Potassium: 3.8 mmol/L (ref 3.5–5.1)
Sodium: 139 mmol/L (ref 135–145)
TOTAL PROTEIN: 6.7 g/dL (ref 6.5–8.1)

## 2018-06-16 MED ORDER — HEPARIN SOD (PORK) LOCK FLUSH 100 UNIT/ML IV SOLN
500.0000 [IU] | INTRAVENOUS | Status: AC | PRN
  Administered 2018-06-16: 500 [IU]

## 2018-06-16 NOTE — Progress Notes (Signed)
SATURATION QUALIFICATIONS: (This note is used to comply with regulatory documentation for home oxygen)  Patient Saturations on Room Air at Rest = 83%  Patient Saturations on Room Air while Ambulating = %  Patient Saturations on  Liters of oxygen while Ambulating = %  Please briefly explain why patient needs home oxygen: Pt requires home O2 due to pulmonary embolisms cause shortness of breath and unable to wean of oxygen.

## 2018-06-16 NOTE — Progress Notes (Signed)
Spoke with patient at bedside, discharging home today. Orders for [facility] placed, patient also states he will need O2 at d/c as well. Per RN sats 83% on RA. Contacted [facility] for referral per patient request, they are aware and working on referral now. No further concerns expressed, anxious to d/c to home. [phone]

## 2018-06-16 NOTE — Progress Notes (Signed)
IP PROGRESS NOTE  Subjective:   [name] reports adequate pain control.  He has ambulated in the hall.  He wants to go home. He believes he or his parents can administer Lovenox.  Objective: Vital signs in last 24 hours: Blood pressure 110/85, pulse 66, temperature 97.6 F (36.4 C), temperature source Oral, resp. rate 17, height 5\' 11"  (1.803 m), weight 190 lb 0.6 oz (86.2 kg), SpO2 100 %.  Intake/Output from previous day: 07/02 0701 - 07/03 0700 In: 0  Out: 275 [Drains:275]  Physical Exam:  HEENT: No thrush or ulcers Lungs: Clear bilaterally Cardiac: RRR Abdomen: tender in the mid abdomen, no hepatomegaly, right upper quadrant biliary drain Extremities: no leg edema   Portacath/PICC-without erythema  Lab Results: Recent Labs    07-01-2018 0758  WBC 9.7  HGB 10.1*  HCT 31.6*  PLT 533*    BMET Recent Labs    2018-07-01 0758 06/16/18 0727  NA 135 139  K 3.9 3.8  CL 96* 100  CO2 27 29  GLUCOSE 109* 115*  BUN 8 7  CREATININE 0.77 0.74  CALCIUM 8.6* 8.7*  Alkaline phosphatase 406, AST 115, ALT 102, bilirubin 5.9  Lab Results  Component Value Date   CEA1 47.72 (H) 06/08/2018    Medications: I have reviewed the patient's current medications.  Assessment/Plan:  1.  Metastatic gastric cancer with abdominal carcinomatosis  Cycle 1 FOLFOX 06/02/2018  2.  Bilateral pulmonary emboli 06/09/2018, initially treated with IV heparin anticoagulation  IVC filter 06/12/2018 after CT chest 06/11/2018 confirmed an increase in the chest but burden  Lower extremity Dopplers 06/10/2018-bilateral lower extremity deep vein thrombosis  Anticoagulation converted to Lovenox 06/15/2018  3.  Biliary obstruction secondary to #1, status post placement of an internal-external biliary drain 05/28/2018  4.  Pain secondary to #1   [name] appears stable.  He is now on Lovenox anticoagulation for the pulmonary emboli.  He continues nasal cannula oxygen.  His pain is adequate  controlled with Duragesic and oxycodone.  He appears stable for discharge from an oncology standpoint. The bilirubin remains elevated despite the biliary drain.  I will consult interventional radiology to evaluate for persistent biliary obstruction.  Recommendations: 1.  Outpatient follow-up will be scheduled at the [place] oncology clinic for cycle 2 FOLFOX during the week of 06/21/2018 2.  Wean oxygen as tolerated, arrange for home oxygen as needed 3.  Continue twice daily therapeutic Lovenox anticoagulation 4.  Interventional radiology consult to evaluate for persistent biliary obstruction  LOS: 7 days   Betsy Coder, MD   06/16/2018, 9:37 AM

## 2018-06-16 NOTE — Progress Notes (Signed)
Referring Physician(s): Elrama  Supervising Physician: Aletta Edouard  Patient Status:  [facility] - In-pt  Chief Complaint:  dyspnea  Subjective: Pt doing ok today; sitting up in chair; has ambulated; eating ok, denies CP,worsening dyspnea, sig abd pain,N/V; would like to go home   Allergies: Cephalexin; Other; Latex; and Tape  Medications: Prior to Admission medications   Medication Sig Start Date End Date Taking? Authorizing Provider  cetirizine (ZYRTEC) 10 MG tablet Take 10 mg by mouth daily.     Yes [provider]  Cyanocobalamin (VITAMIN B 12 PO) Take 1,000 mcg by mouth daily.   Yes [provider]  esomeprazole (NEXIUM) 20 MG capsule TAKE ONE CAPSULE BY MOUTH EVERY DAY 04/10/15  Yes Dorena Cookey, MD  fentaNYL (DURAGESIC - DOSED MCG/HR) 25 MCG/HR patch Place 1 patch (25 mcg total) onto the skin every 3 (three) days. 06/06/18  Yes Georgette Shell, MD  flintstones complete (FLINTSTONES) 60 MG chewable tablet Chew 1 tablet by mouth daily.   Yes [provider]  fluconazole (DIFLUCAN) 200 MG tablet Take 1 tablet (200 mg total) by mouth daily. 06/04/18  Yes Georgette Shell, MD  hydrocortisone cream 1 % Apply 1 application topically daily as needed for itching.   Yes [provider]  oxyCODONE (OXY IR/ROXICODONE) 5 MG immediate release tablet Take 1 tablet (5 mg total) by mouth every 8 (eight) hours as needed for severe pain or breakthrough pain. 06/07/18  Yes Kuneff, Renee A, DO  polyethylene glycol (MIRALAX / GLYCOLAX) packet Take 17 g by mouth daily.   Yes [provider]  prochlorperazine (COMPAZINE) 10 MG tablet Take 1 tablet (10 mg total) by mouth every 6 (six) hours as needed for nausea or vomiting. 06/07/18  Yes Kuneff, Renee A, DO  sildenafil (REVATIO) 20 MG tablet 1-5 tabs PO prior to sexually activity. 04/20/18  Yes Kuneff, Renee A, DO  dronabinol (MARINOL) 5 MG capsule Take 1 capsule (5 mg total) by mouth 2 (two) times  daily before lunch and supper. Patient not taking: Reported on 06/09/2018 06/08/18   Volanda Napoleon, MD  enoxaparin (LOVENOX) 100 MG/ML injection Inject 0.9 mLs (90 mg total) into the skin every 12 (twelve) hours. 06/15/18 07/15/18  Damita Lack, MD     Vital Signs: BP 110/85 (BP Location: Left Arm)   Pulse 66   Temp 97.6 F (36.4 C) (Oral)   Resp 17   Ht '5\' 11"'$  (1.803 m)   Wt 190 lb 0.6 oz (86.2 kg)   SpO2 100%   BMI 26.50 kg/m   Physical Exam biliary drain intact, insertion site with small amt drainage, site NT; drain flushed with sterile NS without difficulty or reflux; output 200 cc green bile  Imaging: No results found.  Labs:  CBC: Recent Labs    06/11/18 0447 06/12/18 0315 06/13/18 0341 July 04, 2018 0758  WBC 10.8* 9.8 10.5 9.7  HGB 9.2* 9.5* 9.4* 10.1*  HCT 29.2* 29.2* 29.4* 31.6*  PLT 473* 499* 522* 533*    COAGS: Recent Labs    05/26/18 1548 05/31/18 0419 06/09/18 1254  INR 1.2* 1.04 1.40  APTT  --   --  31    BMP: Recent Labs    06/12/18 0315 06/13/18 0341 2018/07/04 0758 06/16/18 0727  NA 140 138 135 139  K 4.3 4.5 3.9 3.8  CL 105 102 96* 100  CO2 '24 28 27 29  '$ GLUCOSE 105* 106* 109* 115*  BUN '10 11 8 '$ 7  CALCIUM 8.5* 8.6* 8.6* 8.7*  CREATININE 0.73 0.89 0.77 0.74  GFRNONAA >60 >60 >60 >60  GFRAA >60 >60 >60 >60    LIVER FUNCTION TESTS: Recent Labs    06/12/18 0315 06/13/18 0341 29-Jun-2018 0758 06/16/18 0727  BILITOT 6.6* 6.3* 6.1* 5.9*  AST 114* 107* 122* 115*  ALT 113* 102* 106* 102*  ALKPHOS 360* 383* 437* 406*  PROT 6.5 6.6 7.0 6.7  ALBUMIN 2.5* 2.6* 2.7* 2.6*    Assessment and Plan: Pt with hx met gastric ca, recent PE/bilat DVT; Status post IVC filter placement and biliary drain exchange(placed initially on 6/14); afebrile; t bili 5.9(6.1), creat nl; bile cx pend; bile cytology pend; scheduled for f/u cholangiogram on 7/26; as OP rec once daily irrigation of drain with 5-10 cc sterile NS, recording of output and dressing  changes daily     Electronically Signed: D. Rowe Robert, PA-C 06/16/2018, 9:31 AM   I spent a total of 15 minutes at the the patient's bedside AND on the patient's hospital floor or unit, greater than 50% of which was counseling/coordinating care for biliary drain    Patient ID: [name], male   DOB: 11/08/1961, 57 y.o.   MRN: [id number]

## 2018-06-17 LAB — BODY FLUID CULTURE: SPECIAL REQUESTS: NORMAL

## 2018-06-17 NOTE — Discharge Summary (Signed)
Physician Discharge Summary  [name] PYK:[id number] DOB: August 27, 1961 DOA: 06/09/2018  PCP: Howard Pouch A, DO  Admit date: 06/09/2018 Discharge date: 06/16/2018  Admitted From:Home.  Disposition:  *Home.  Recommendations for Outpatient Follow-up:  1. Follow up with PCP in 1-2 weeks 2. Please obtain BMP/CBC in one week 3. Please follow up with oncology as scheduled.   Home Health:yes  Equipment/Devices:3 lit of [place] oxygen.   Discharge Condition: stable.  CODE STATUS: full code.  Diet recommendation: Heart Healthy   Brief/Interim Summary: 57 y.o.malewith medical history significant ofadenocarcinoma-gastric, transaminitis/ elevated total bilirubin status post drain placement 6/14, GERD, chronic constipation recently diagnosed of gastric cancer comes back to the hospital with complains of shortness of breath. Patient was recently here and discharged on June 04, 2018 after being treated for elevated LFTs with percutaneous biliary drain, status post endoscopy with gastric biopsy suggestive of adenocarcinoma and started on FOLFOX on June 19.  He returns to the hospital with complains of shortness of breath and hypoxia.  He was found to have submassive pulmonary embolism with right-sided heart strain therefore admitted on heparin drip.  Patient had worsening of shortness of breath on 6/28 with concern of progression of his clot therefore CT of the chest was done which confirmed progression of his pulmonary embolism with increasing right heart strain.  Interventional radiology was consulted and IVC filter was placed.  He was monitored in the stepdown unit for 2-3 days and was transition out to telemetry.  Heparin drip stopped on 7/2 transition to Lovenox.    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  His ambulating oxygen demands require about 3lit . Will discharge him on oxygen. - Initial CT a of the chest showed pulmonary embolism with right heart strain but had a follow-up CAT scan due to worsening of breathing symptoms on 6/28 which showed progression of his pulmonary embolism with increasing right heart strain  Status post IVC filter placement on 6/28 - Echocardiogram done on 6/27 showed ejection fraction 60-65% with grade 1 diastolic dysfunction. -Lower extremity Doppler is positive for bilateral DVT -Pain control and discharge on lovenox for anti coagulation.   Gastro adenocarcinoma Transaminitis with mild ductal dilation/elevated total bilirubin -Biliary drain is in place since 6/14.  Continue to trend LFTs.  He will need a follow-up cholangiogram on 7/26 per IR  History of GERD -Continue PPI     Discharge Instructions  Discharge Instructions    Diet - low sodium heart healthy   Complete by:  As directed    Discharge instructions   Complete by:  As directed    Please follow up with oncology as recommended.     Allergies as of 06/16/2018      Reactions   Cephalexin Other (See Comments)   "Sore throat" REACTION: SORE THROAT   Other    Bee sting   Latex Rash   Tape Rash      Medication List    STOP taking these medications   dronabinol 5 MG capsule Commonly known as:  MARINOL     TAKE these medications   cetirizine 10 MG tablet Commonly known  as:  ZYRTEC Take 10 mg by mouth daily.   enoxaparin 100 MG/ML injection Commonly known as:  LOVENOX Inject 0.9 mLs (90 mg total) into the skin every 12 (twelve) hours.   esomeprazole 20 MG capsule Commonly known as:  NEXIUM TAKE ONE CAPSULE BY MOUTH EVERY DAY   fentaNYL 25 MCG/HR  patch Commonly known as:  [place] - dosed mcg/hr Place 1 patch (25 mcg total) onto the skin every 3 (three) days.   flintstones complete 60 MG chewable tablet Chew 1 tablet by mouth daily.   fluconazole 200 MG tablet Commonly known as:  DIFLUCAN Take 1 tablet (200 mg total) by mouth daily.   hydrocortisone cream 1 % Apply 1 application topically daily as needed for itching.   oxyCODONE 5 MG immediate release tablet Commonly known as:  Oxy IR/ROXICODONE Take 1 tablet (5 mg total) by mouth every 8 (eight) hours as needed for severe pain or breakthrough pain.   polyethylene glycol packet Commonly known as:  MIRALAX / GLYCOLAX Take 17 g by mouth daily.   prochlorperazine 10 MG tablet Commonly known as:  COMPAZINE Take 1 tablet (10 mg total) by mouth every 6 (six) hours as needed for nausea or vomiting.   sildenafil 20 MG tablet Commonly known as:  REVATIO 1-5 tabs PO prior to sexually activity.   VITAMIN B 12 PO Take 1,000 mcg by mouth daily.      Follow-up Information    Kuneff, Renee A, DO. Schedule an appointment as soon as possible for a visit in 1 week(s).   Specialty:  Family Medicine Contact information: [address] [place] [address] [phone]          Allergies  Allergen Reactions  . Cephalexin Other (See Comments)    "Sore throat" REACTION: SORE THROAT  . Other     Bee sting  . Latex Rash  . Tape Rash    Consultations:  Oncology.   IR     Procedures/Studies: Dg Chest 2 View  Result Date: 06/09/2018 CLINICAL DATA:  Shortness of breath. EXAM: CHEST - 2 VIEW COMPARISON:  Radiographs of August 25, 2007. CT scan of May 27, 2018. FINDINGS: Stable cardiomediastinal silhouette. No pneumothorax or pleural effusion is noted. Multiple old bilateral rib fractures are noted. Old bilateral clavicular fractures are noted. Right internal jugular Port-A-Cath is noted with distal tip in expected position of cavoatrial junction. IMPRESSION:  Multiple old bilateral rib and clavicular fractures. No acute intracranial abnormality seen. Electronically Signed   By: Marijo Conception, M.D.   On: 06/09/2018 14:06   Ct Chest W Contrast  Result Date: 05/28/2018 CLINICAL DATA:  57 year old male with several weeks of abdominal discomfort. Mass involving the pancreas and stomach. Followup study. EXAM: CT CHEST, ABDOMEN, AND PELVIS WITH CONTRAST TECHNIQUE: Multidetector CT imaging of the chest, abdomen and pelvis was performed following the standard protocol during bolus administration of intravenous contrast. CONTRAST:  122m ISOVUE-300 IOPAMIDOL (ISOVUE-300) INJECTION 61%, 340mISOVUE-300 IOPAMIDOL (ISOVUE-300) INJECTION 61% COMPARISON:  CT the abdomen and pelvis 05/19/2018. FINDINGS: CT CHEST FINDINGS Cardiovascular: Heart size is normal. There is no significant pericardial fluid, thickening or pericardial calcification. Mild atherosclerosis in the great vessels. No coronary artery calcifications. Mediastinum/Nodes: Some of the loculated fluid components or cystic neoplasm from the upper abdomen (discussed below) extends cephalad via the esophageal hiatus into the lower middle mediastinum to the right of the distal esophagus. No pathologically enlarged mediastinal or hilar lymph nodes are noted. Esophagus is unremarkable in appearance. No  axillary lymphadenopathy. Lungs/Pleura: Small left-sided Bochdalek's hernia incidentally noted. No suspicious appearing pulmonary nodules or masses are noted. No acute consolidative airspace disease. A few areas of mild linear scarring are noted. No pleural effusions. Musculoskeletal: Multiple old healed bilateral rib fractures with significant posttraumatic deformity of the thoracic cage. There are no aggressive appearing lytic or blastic lesions noted in the visualized portions of the skeleton. CT ABDOMEN PELVIS FINDINGS Hepatobiliary: In segment 2 of the liver there is a well-defined 1.2 cm low to intermediate attenuation  lesion which is incompletely characterized. No other intrahepatic lesion is confidently identified on today's examination. There is extensive scalloping of the surface of the liver which appears to be related to multiple serosal implants, the largest of which is overlying the right lobe of the liver where a low-attenuation implant or loculated fluid collection measures approximately 4.6 x 6.8 cm (axial image 51 of series 2). Mild intrahepatic biliary ductal dilatation. Common bile duct is poorly demonstrated. Status post cholecystectomy. Pancreas: In the tail of the pancreas there is a poorly defined mass which extends laterally and cephalad coming in contact with the adjacent left hemidiaphragm (which it appears to invade), as well as the lateral left upper abdominal wall and the lateral surface of the cardia and proximal body of the stomach. This mass measures approximately 5.5 x 6.4 x 5.2 cm (axial image 44 of series 2 and coronal image 98 of series 8). The remainder of the body and head of the pancreas are otherwise generally unremarkable in appearance. Spleen: Status post splenectomy. Adrenals/Urinary Tract: Bilateral kidneys and bilateral adrenal glands are normal in appearance. No hydroureteronephrosis. Urinary bladder is normal in appearance. Stomach/Bowel: There is a poorly defined infiltrative mass-like area in the proximal aspect of the stomach along the greater curvature which is estimated to measure approximately 11.5 x 4.1 x 6.0 cm (axial image 43 of series 2 and coronal image 77 of series 8). Several other more cystic appearing areas are also noted associated with the gastric wall both along the greater curvature, the inferior surface of the stomach, and along the lesser curvature in the antral pre-pyloric region. No pathologic dilatation of small bowel or colon. Appendix is not confidently identified. Vascular/Lymphatic: Aortic atherosclerosis, without evidence of aneurysm or dissection in the  abdominal or pelvic vasculature. Several prominent borderline enlarged and mildly enlarged retroperitoneal lymph nodes are noted measuring up to 1 cm in short axis in the left para-aortic nodal station (axial image 66 of series 2). In the upper abdomen in the expected location of portacaval, hepatic duodenal and celiac axis nodal stations there are large low-attenuation areas which have a cystic appearance, but could reflect necrotic lymphadenopathy. Some of this extends into the lower middle mediastinum (discussed above). While lymphadenopathy is not excluded, at this time these are favored to reflect malignant mucinous/cystic metastatic deposits Reproductive: Prostate gland and seminal vesicles are unremarkable in appearance. Other: Trace volume of ascites. Multifocal peritoneal nodularity noted, concerning for widespread intraperitoneal metastatic disease. Musculoskeletal: There are no aggressive appearing lytic or blastic lesions noted in the visualized portions of the skeleton. IMPRESSION: 1. Widespread intraperitoneal metastasis, as detailed above. The origin of this neoplasm is uncertain, and could either be from a large infiltrative mass in the tail of the pancreas, which has subsequently invaded the proximal stomach, or a large gastric neoplasm which extended into a splenectomy bed now involving the tail of the pancreas. Tissue sampling is recommended to establish a definitive diagnosis. 2. No definite evidence of intrapulmonary metastasis.  3. There does appear to be direct spread of disease through the esophageal hiatus into the lower middle mediastinum. 4. Additional incidental findings, as above. Electronically Signed   By: Vinnie Langton M.D.   On: 05/28/2018 07:25   Ct Angio Chest Pe W Or Wo Contrast  Result Date: 06/11/2018 CLINICAL DATA:  57 year old with metastatic gastric cancer and pulmonary emboli. Worsening shortness of breath and weakness. EXAM: CT ANGIOGRAPHY CHEST WITH CONTRAST  TECHNIQUE: Multidetector CT imaging of the chest was performed using the standard protocol during bolus administration of intravenous contrast. Multiplanar CT image reconstructions and MIPs were obtained to evaluate the vascular anatomy. CONTRAST:  183m ISOVUE-370 IOPAMIDOL (ISOVUE-370) INJECTION 76% COMPARISON:  Chest CTA 06/09/2018. FINDINGS: Cardiovascular: There is new clot in the distal left pulmonary artery. There is increased clot burden in the main left lower lobe pulmonary artery and left lower lobe segmental branches. There is increased clot involving the left upper lobe segmental branches. The large clot burden in the right pulmonary arteries has not significantly changed. There continues to be clot in the distal main right pulmonary artery and extending into the right upper and lower pulmonary arteries. Right ventricle continues to be enlarged and there continues to be evidence for right heart strain. The RV: LV ratio has slightly increased, now measuring close to 1.4. No significant pericardial fluid. Mediastinum/Nodes: Visualized thyroid tissue is unremarkable. Right jugular Port-A-Cath is present. The tip is probably near the cavoatrial junction but difficult to characterize due to motion artifact. Again noted is large amount of fluid in the distal paraesophageal region which is extending up from the abdomen. No significant chest lymphadenopathy. Lungs/Pleura: The trachea and mainstem bronchi are patent. Again noted is atelectasis or scarring in the lingula. There is increased atelectasis or trace pleural fluid at the left lung base. No large areas of lung consolidation or airspace disease. Upper Abdomen: Patient has an internal/external biliary drain from a right hepatic approach. There is a small amount of perihepatic ascites which is slightly hyperdense. Again noted is a large low-density structure along the posterior right hepatic lobe in the subcapsular region. Again noted are numerous  low-density collections throughout the gastrohepatic ligament region and surrounding the stomach and anterior aspect of the pancreas. There is evidence for intrahepatic biliary dilatation despite the percutaneous drain. Evidence for peritoneal disease in the upper abdomen and increased stranding around left adrenal gland. Musculoskeletal: Multiple old rib fractures. Old clavicle fractures. Review of the MIP images confirms the above findings. IMPRESSION: Increased pulmonary emboli in the left pulmonary arteries. The large clot burden in the right pulmonary arteries has not significantly changed. Slightly increased right heart strain based on the RV : LV ratio. No significant changes in the upper abdomen with respect to the metastatic cancer and biliary drain. There continues to be evidence for intrahepatic biliary dilatation despite the biliary drain. These results were called by telephone at the time of interpretation on 06/11/2018 at 4:27 pm to Dr. AGerlean Ren, who verbally acknowledged these results. Electronically Signed   By: AMarkus DaftM.D.   On: 06/11/2018 16:29   Ct Angio Chest Pe W/cm &/or Wo Cm  Result Date: 06/09/2018 CLINICAL DATA:  Shortness of breath EXAM: CT ANGIOGRAPHY CHEST CT ABDOMEN AND PELVIS WITH CONTRAST TECHNIQUE: Multidetector CT imaging of the chest was performed using the standard protocol during bolus administration of intravenous contrast. Multiplanar CT image reconstructions and MIPs were obtained to evaluate the vascular anatomy. Multidetector CT imaging of the abdomen and pelvis  was performed using the standard protocol during bolus administration of intravenous contrast. CONTRAST:  192m ISOVUE-370 IOPAMIDOL (ISOVUE-370) INJECTION 76% COMPARISON:  06/01/2018 abdominal CT FINDINGS: CTA CHEST FINDINGS Cardiovascular: Large volume of acute pulmonary embolism seen branching into the right-sided lobar vessels, occlusive to nearly occlusive, and seen within multiple left lower lobe  segmental branches. Abnormal RV to LV ratio of 1.26. No pericardial effusion. No acute systemic arterial finding. Mediastinum/Nodes: Negative for adenopathy. Low-density, likely mucinous material projects through the esophageal hiatus. Lungs/Pleura: No acute finding.  No edema or infarct Musculoskeletal: Remote bilateral rib fractures. Remote bilateral clavicle fracture. No acute osseous finding Review of the MIP images confirms the above findings. Critical Value/emergent results were called by telephone at the time of interpretation on 06/09/2018 at 4:15 pm to Dr. MCharlesetta Shanks, who verbally acknowledged these results. CT ABDOMEN and PELVIS FINDINGS Hepatobiliary: Subcapsular low-density masses along the posterior right liver, peritoneal metastatic disease.Internal transhepatic biliary catheter from the right. The tube is well positioned. There is similar intrahepatic bile duct dilatation Pancreas: Gastric mass invades the tail of pancreas. Spleen: Absent. Adrenals/Urinary Tract: Negative adrenals. No hydronephrosis or stone. Unremarkable bladder. Stomach/Bowel: History of gastric cancer correlating with low-density thickening and mucosal discontinuity at the level of the body, extending extra serosal. There is known peritoneal metastatic disease with nodularity and low-density collections or mucinous material. Negative for bowel obstruction Vascular/Lymphatic: No visible iliac vein thrombus. Minimal atherosclerotic calcification . Reproductive:Negative Other: No ascites or pneumoperitoneum. Musculoskeletal: No acute or aggressive finding. Remote right-sided lumbar transverse process fractures Review of the MIP images confirms the above findings. IMPRESSION: 1. Large volume acute pulmonary embolism with multi lobar clot on the right and multi segment clot on the left. Right heart strain (RV/LV Ratio = 1.2) consistent with at least submassive (intermediate risk) PE. The presence of right heart strain has been  associated with an increased risk of morbidity and mortality. Please activate Code PE by paging [phone] 2. Negative for pulmonary edema or lung infarct. 3. Known gastric cancer with peritoneal metastatic disease. No acute finding in the abdomen. 4. Recent trans hepatic biliary drain up sizing. The tube is in good position. Electronically Signed   By: JMonte FantasiaM.D.   On: 06/09/2018 16:23   Ct Abdomen W Contrast  Result Date: 06/02/2018 CLINICAL DATA:  Concern for metastatic either pancreatic or gastric cancer with concern for obstructive malignant jaundiced post failed attempted ERCP subsequently undergoing ultrasound fluoroscopic guided placement of a percutaneous biliary drainage catheter on 05/26/2018. Unfortunately, the patient's LFTs continue to arise with cholangiogram demonstrating wide patency of the biliary drainage catheter but lack of substantial opacification of the intrahepatic biliary tree. Please evaluate for residual intrahepatic biliary ductal dilatation. EXAM: CT ABDOMEN WITH CONTRAST TECHNIQUE: Multidetector CT imaging of the abdomen was performed using the standard protocol following bolus administration of intravenous contrast. CONTRAST:  1052mISOVUE-300 IOPAMIDOL (ISOVUE-300) INJECTION 61% COMPARISON:  CT chest, abdomen and pelvis - 05/27/2018; ultrasound fluoroscopic guided percutaneous biliary drainage catheter placement-05/26/2018 FINDINGS: Lower chest: Limited visualization of the lower thorax demonstrates development of trace bilateral pleural effusions, left greater than right. Minimal subsegmental atelectasis within the bilateral lower lobes. Normal heart size. Trace amount of pericardial fluid, presumably physiologic. Hepatobiliary: Interval placement of a right anterior biliary percutaneous drainage catheter with end coiled within the duodenum and radiopaque marker at the level the biliary hilum. Despite appropriate positioning of the percutaneous biliary drainage  catheter, there has been no substantial change to potential slight worsening of previously  noted mild intrahepatic biliary ductal dilatation. Normal hepatic contour. Loculated subcapsular fluid collection about the posterior aspect the right lobe of the liver has minimally increased in size in the interval, currently measuring 5.5 x 6.6 x 7.8 cm as measured in greatest oblique short axis axial (image 26, series 2) and coronal (image 80, series 5) dimensions respectively, previously, 5.7 x 3.4 x 4.6 cm. Geographic area of hypoattenuation about the subcapsular aspect of the caudal tip of the right lobe of the liver likely represents the sequela of recent percutaneous biliary drainage catheter placement and or streak artifact from the adjacent rib (image 33, series 2). No definitive discrete intrahepatic lesions. Portal vein appears patent. Pancreas: The distal end of the pancreatic tail is again inseparable from ill-defined peripherally enhancing loculated fluid within the left upper abdominal quadrant (representative axial image 19, series 2, coronal image 73, series 5), favored to represent secondary involvement as the remainder of the pancreas appears normal. Spleen: Absent. Adrenals/Urinary Tract: There is symmetric enhancement and excretion of the bilateral kidneys. No definite renal stones this postcontrast examination. No discrete renal lesions. No urine obstruction or perinephric stranding. Normal appearance the bilateral adrenal glands. The urinary bladder was not imaged. Stomach/Bowel: Read demonstrated multiple apparently loculated fluid collections about the esophagus, stomach and porta hepatis. Dominant fluid collection about the distal aspect of the esophagus at the level of the diaphragmatic hiatus measures approximately 4.1 x 2.9 cm (axial image 13, series 2), dominant bilobed fluid collection about the greater curvature of the stomach measures approximately 7.0 x 3.2 cm (image 24, series 2) and  dominant collection adjacent to the anterior aspect of the IVC measures approximately 2.6 x 2.4 cm (image 26, series 2), all of which appear grossly unchanged compared to the 05/2018 examination. Vascular/Lymphatic: Normal caliber of the abdominal aorta. The major branch vessels of the abdominal aorta appear patent. Evaluation for abdominal adenopathy is degraded secondary to the complex irregular fluid collections within the imaged upper abdomen. Read demonstrated findings worrisome for mental caking about the ventral aspect of the mid abdomen (axial image 47 and 56, series 2). Other: Minimal amount of right lateral body wall subcutaneous edema adjacent to the percutaneous biliary drainage catheter. Musculoskeletal: No definite acute or aggressive osseous abnormalities. Re-demonstrated bilateral old/healed rib fractures. IMPRESSION: 1. Appropriately positioned percutaneous biliary drainage catheter with no change to slight worsening of intrahepatic biliary ductal dilatation. 2. Slight increase in size of subcapsular fluid collection about the posterior aspect of the right lobe of the liver, currently measuring 7.8 cm, previously, 5.6 cm. Otherwise, grossly unchanged findings of extensive metastatic disease within the imaged upper abdomen without significant interval change. PLAN: - above discussed with Dr. Earleen Newport (IR MD at [facility] today). - given above findings, patient will undergo fluoroscopic guided cholangiogram and percutaneous biliary drainage catheter exchange and potential up sizing. Electronically Signed   By: Sandi Mariscal M.D.   On: 06/02/2018 09:12   Ct Abdomen Pelvis W Contrast  Result Date: 06/09/2018 CLINICAL DATA:  Shortness of breath EXAM: CT ANGIOGRAPHY CHEST CT ABDOMEN AND PELVIS WITH CONTRAST TECHNIQUE: Multidetector CT imaging of the chest was performed using the standard protocol during bolus administration of intravenous contrast. Multiplanar CT image reconstructions and MIPs were obtained to  evaluate the vascular anatomy. Multidetector CT imaging of the abdomen and pelvis was performed using the standard protocol during bolus administration of intravenous contrast. CONTRAST:  122m ISOVUE-370 IOPAMIDOL (ISOVUE-370) INJECTION 76% COMPARISON:  06/01/2018 abdominal CT FINDINGS: CTA CHEST FINDINGS Cardiovascular: Large volume  of acute pulmonary embolism seen branching into the right-sided lobar vessels, occlusive to nearly occlusive, and seen within multiple left lower lobe segmental branches. Abnormal RV to LV ratio of 1.26. No pericardial effusion. No acute systemic arterial finding. Mediastinum/Nodes: Negative for adenopathy. Low-density, likely mucinous material projects through the esophageal hiatus. Lungs/Pleura: No acute finding.  No edema or infarct Musculoskeletal: Remote bilateral rib fractures. Remote bilateral clavicle fracture. No acute osseous finding Review of the MIP images confirms the above findings. Critical Value/emergent results were called by telephone at the time of interpretation on 06/09/2018 at 4:15 pm to Dr. Charlesetta Shanks , who verbally acknowledged these results. CT ABDOMEN and PELVIS FINDINGS Hepatobiliary: Subcapsular low-density masses along the posterior right liver, peritoneal metastatic disease.Internal transhepatic biliary catheter from the right. The tube is well positioned. There is similar intrahepatic bile duct dilatation Pancreas: Gastric mass invades the tail of pancreas. Spleen: Absent. Adrenals/Urinary Tract: Negative adrenals. No hydronephrosis or stone. Unremarkable bladder. Stomach/Bowel: History of gastric cancer correlating with low-density thickening and mucosal discontinuity at the level of the body, extending extra serosal. There is known peritoneal metastatic disease with nodularity and low-density collections or mucinous material. Negative for bowel obstruction Vascular/Lymphatic: No visible iliac vein thrombus. Minimal atherosclerotic calcification .  Reproductive:Negative Other: No ascites or pneumoperitoneum. Musculoskeletal: No acute or aggressive finding. Remote right-sided lumbar transverse process fractures Review of the MIP images confirms the above findings. IMPRESSION: 1. Large volume acute pulmonary embolism with multi lobar clot on the right and multi segment clot on the left. Right heart strain (RV/LV Ratio = 1.2) consistent with at least submassive (intermediate risk) PE. The presence of right heart strain has been associated with an increased risk of morbidity and mortality. Please activate Code PE by paging [phone]. 2. Negative for pulmonary edema or lung infarct. 3. Known gastric cancer with peritoneal metastatic disease. No acute finding in the abdomen. 4. Recent trans hepatic biliary drain up sizing. The tube is in good position. Electronically Signed   By: Monte Fantasia M.D.   On: 06/09/2018 16:23   Ct Abdomen Pelvis W Contrast  Result Date: 05/28/2018 CLINICAL DATA:  57 year old male with several weeks of abdominal discomfort. Mass involving the pancreas and stomach. Followup study. EXAM: CT CHEST, ABDOMEN, AND PELVIS WITH CONTRAST TECHNIQUE: Multidetector CT imaging of the chest, abdomen and pelvis was performed following the standard protocol during bolus administration of intravenous contrast. CONTRAST:  15m ISOVUE-300 IOPAMIDOL (ISOVUE-300) INJECTION 61%, 371mISOVUE-300 IOPAMIDOL (ISOVUE-300) INJECTION 61% COMPARISON:  CT the abdomen and pelvis 05/19/2018. FINDINGS: CT CHEST FINDINGS Cardiovascular: Heart size is normal. There is no significant pericardial fluid, thickening or pericardial calcification. Mild atherosclerosis in the great vessels. No coronary artery calcifications. Mediastinum/Nodes: Some of the loculated fluid components or cystic neoplasm from the upper abdomen (discussed below) extends cephalad via the esophageal hiatus into the lower middle mediastinum to the right of the distal esophagus. No pathologically  enlarged mediastinal or hilar lymph nodes are noted. Esophagus is unremarkable in appearance. No axillary lymphadenopathy. Lungs/Pleura: Small left-sided Bochdalek's hernia incidentally noted. No suspicious appearing pulmonary nodules or masses are noted. No acute consolidative airspace disease. A few areas of mild linear scarring are noted. No pleural effusions. Musculoskeletal: Multiple old healed bilateral rib fractures with significant posttraumatic deformity of the thoracic cage. There are no aggressive appearing lytic or blastic lesions noted in the visualized portions of the skeleton. CT ABDOMEN PELVIS FINDINGS Hepatobiliary: In segment 2 of the liver there is a well-defined 1.2 cm low to  intermediate attenuation lesion which is incompletely characterized. No other intrahepatic lesion is confidently identified on today's examination. There is extensive scalloping of the surface of the liver which appears to be related to multiple serosal implants, the largest of which is overlying the right lobe of the liver where a low-attenuation implant or loculated fluid collection measures approximately 4.6 x 6.8 cm (axial image 51 of series 2). Mild intrahepatic biliary ductal dilatation. Common bile duct is poorly demonstrated. Status post cholecystectomy. Pancreas: In the tail of the pancreas there is a poorly defined mass which extends laterally and cephalad coming in contact with the adjacent left hemidiaphragm (which it appears to invade), as well as the lateral left upper abdominal wall and the lateral surface of the cardia and proximal body of the stomach. This mass measures approximately 5.5 x 6.4 x 5.2 cm (axial image 44 of series 2 and coronal image 98 of series 8). The remainder of the body and head of the pancreas are otherwise generally unremarkable in appearance. Spleen: Status post splenectomy. Adrenals/Urinary Tract: Bilateral kidneys and bilateral adrenal glands are normal in appearance. No  hydroureteronephrosis. Urinary bladder is normal in appearance. Stomach/Bowel: There is a poorly defined infiltrative mass-like area in the proximal aspect of the stomach along the greater curvature which is estimated to measure approximately 11.5 x 4.1 x 6.0 cm (axial image 43 of series 2 and coronal image 77 of series 8). Several other more cystic appearing areas are also noted associated with the gastric wall both along the greater curvature, the inferior surface of the stomach, and along the lesser curvature in the antral pre-pyloric region. No pathologic dilatation of small bowel or colon. Appendix is not confidently identified. Vascular/Lymphatic: Aortic atherosclerosis, without evidence of aneurysm or dissection in the abdominal or pelvic vasculature. Several prominent borderline enlarged and mildly enlarged retroperitoneal lymph nodes are noted measuring up to 1 cm in short axis in the left para-aortic nodal station (axial image 66 of series 2). In the upper abdomen in the expected location of portacaval, hepatic duodenal and celiac axis nodal stations there are large low-attenuation areas which have a cystic appearance, but could reflect necrotic lymphadenopathy. Some of this extends into the lower middle mediastinum (discussed above). While lymphadenopathy is not excluded, at this time these are favored to reflect malignant mucinous/cystic metastatic deposits Reproductive: Prostate gland and seminal vesicles are unremarkable in appearance. Other: Trace volume of ascites. Multifocal peritoneal nodularity noted, concerning for widespread intraperitoneal metastatic disease. Musculoskeletal: There are no aggressive appearing lytic or blastic lesions noted in the visualized portions of the skeleton. IMPRESSION: 1. Widespread intraperitoneal metastasis, as detailed above. The origin of this neoplasm is uncertain, and could either be from a large infiltrative mass in the tail of the pancreas, which has  subsequently invaded the proximal stomach, or a large gastric neoplasm which extended into a splenectomy bed now involving the tail of the pancreas. Tissue sampling is recommended to establish a definitive diagnosis. 2. No definite evidence of intrapulmonary metastasis. 3. There does appear to be direct spread of disease through the esophageal hiatus into the lower middle mediastinum. 4. Additional incidental findings, as above. Electronically Signed   By: Vinnie Langton M.D.   On: 05/28/2018 07:25   Ct Abdomen Pelvis W Contrast  Result Date: 05/19/2018 CLINICAL DATA:  Mid and lower abdominal pain for several months. Nausea and vomiting with hematemesis beginning last night. Personal history of melanoma and cutaneous squamous cell carcinoma. EXAM: CT ABDOMEN AND PELVIS WITH CONTRAST TECHNIQUE:  Multidetector CT imaging of the abdomen and pelvis was performed using the standard protocol following bolus administration of intravenous contrast. CONTRAST:  12m ISOVUE-300 IOPAMIDOL (ISOVUE-300) INJECTION 61% COMPARISON:  None. FINDINGS: Lower Chest: No acute findings. Old bilateral rib fracture deformities noted. Hepatobiliary: No hepatic masses identified. Small cysts seen in the posterior left hepatic lobe. Prior cholecystectomy. No evidence of biliary obstruction. Pancreas: Ill-defined low-attenuation mass is seen in the pancreatic tail measuring 3.3 x 2.0 cm on image 24/2. Pancreatic ductal dilatation is seen in the distal pancreatic tail upstream from this mass. Spleen: Prior splenectomy. Ill-defined soft tissue mass in the splenectomy bed which abuts the lateral wall the stomach and pancreatic tail. This measures 5.8 x 4.2 cm on image 19/2. Adrenals/Urinary Tract: No masses identified. No evidence of hydronephrosis. Stomach/Bowel: Bulky masslike soft tissue density is seen along the lateral wall of the gastric body and fundus. A fluid density component is seen along the inferolateral wall of the gastric body  which measures 6.3 x 3.2 cm on image 10/7. Ill-defined adjacent soft tissue density is also seen in the left upper quadrant, as described in the spleen section above. Proximal small bowel loops are distended by oral contrast, but no transition point is seen suggesting this is due to bolus ingestion of contrast. Vascular/Lymphatic: Mild lymphadenopathy is seen in the left paraaortic region, largest measuring 1.5 cm on image 30/2. No other pathologically enlarged lymph nodes identified. No significant vascular abnormality . Reproductive:  No mass or other significant abnormality. Other: Mild ascites is seen. Multilocular fluid collections are seen in the upper abdomen in the gastrohepatic ligament, porta hepatis, and portacaval spaces. Mild peritoneal thickening and enhancement is seen in the right paracolic gutter and dependent portion of the pelvis. Mild soft tissue stranding and nodularity is also seen in the region of the greater omentum in the left abdomen. 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 Musculoskeletal: No suspicious bone lesions identified. IMPRESSION: 3.3 cm masslike soft tissue density in the pancreatic tail with proximal pancreatic ductal dilatation. This could be due to primary pancreatic carcinoma or metastatic disease involving the pancreas. Bulky masslike soft tissue density along the lateral wall the stomach, and in the adjacent gastrosplenic ligament. This is suspicious for metastatic disease, although primary gastric carcinoma cannot be excluded. Consider endoscopy and [place]. Mild ascites with multiloculated fluid collections in the upper abdomen. Peritoneal and omental soft tissue thickening and nodularity, highly suspicious for peritoneal carcinomatosis. Electronically Signed   By: JEarle GellM.D.   On: 05/19/2018 11:29   Ir Ivc Filter Plmt / S&i /img Guid/mod Sed  Result Date: 06/12/2018 CLINICAL DATA:  Recurrent pulmonary emboli despite  therapeutic anticoagulation. Caval filtration requested. EXAM: INFERIOR VENACAVOGRAM IVC FILTER PLACEMENT UNDER FLUOROSCOPY FLUOROSCOPY TIME:  54 seconds, 283 mGy TECHNIQUE: The procedure, risks (including but not limited to bleeding, infection, organ damage ), benefits, and alternatives were explained to the patient. Questions regarding the procedure were encouraged and answered. The patient understands and consents to the procedure. Patency of the right IJ vein was confirmed with ultrasound with image documentation. An appropriate skin site was determined. Skin site was marked, prepped with chlorhexidine, and draped using maximum barrier technique. The region was infiltrated locally with 1% lidocaine. Intravenous Fentanyl and Versed were administered as conscious sedation during continuous monitoring of the patient's level of consciousness and physiological / cardiorespiratory status by the radiology RN, with a total moderate sedation time of 20 minutes. Under real-time ultrasound guidance, the right IJ  vein was accessed with a 21 gauge micropuncture needle; the needle tip within the vein was confirmed with ultrasound image documentation. The needle was exchanged over a 018 guidewire for a transitional dilator, which allow advancement of the [employer] [facility] wire into the IVC. A long 6 French vascular sheath was placed for inferior venacavography. This demonstrated no caval thrombus. Renal vein inflows were evident. The [facility] IVC filter was advanced through the sheath and successfully deployed under fluoroscopy at the L2 level. Followup cavagram demonstrates stable filter position and no evident complication. The sheath was removed and hemostasis achieved at the site. No immediate complication. IMPRESSION: 1. Normal IVC. No thrombus or significant anatomic variation. 2. Technically successful infrarenal IVC filter placement. This is a retrievable model. PLAN: This IVC filter is potentially retrievable. The patient will be  assessed for filter retrieval by Interventional Radiology in approximately 8-12 weeks. Further recommendations regarding filter retrieval, continued surveillance or declaration of device permanence, will be made at that time. Electronically Signed   By: Lucrezia Europe M.D.   On: 06/12/2018 07:53   US Abdomen Limited  Result Date: 05/29/2018 CLINICAL DATA:  57 year old male with a history of questionable ascites. EXAM: LIMITED ABDOMEN ULTRASOUND FOR ASCITES TECHNIQUE: Limited ultrasound survey for ascites was performed in all four abdominal quadrants. COMPARISON:  None. FINDINGS: Limited ultrasound survey of the abdomen demonstrates no significant ascites. IMPRESSION: Sonographic survey demonstrates no significant ascites. Electronically Signed   By: Corrie Mckusick D.O.   On: 05/29/2018 14:33   Dg Chest Port 1 View  Result Date: 06/11/2018 CLINICAL DATA:  Dyspnea EXAM: PORTABLE CHEST 1 VIEW COMPARISON:  CT chest 06/11/2018, chest two-view 06/09/2018 FINDINGS: Cardiac enlargement. Negative for heart failure. Negative for pneumonia or effusion. Mild left lower lobe atelectasis. Port-A-Cath tip in the upper right atrium. Chronic clavicle fractures bilaterally. IMPRESSION: Cardiac enlargement without acute abnormality. Electronically Signed   By: Franchot Gallo M.D.   On: 06/11/2018 16:14   Ir Cholangiogram Existing Tube  Result Date: 06/01/2018 INDICATION: Concern for metastatic gastric cancer with presumed malignant biliary obstruction post failed attempted ERCP and as such underwent ultrasound and fluoroscopic guided placement of a right hepatic approach biliary drainage catheter on 05/26/2018. Unfortunately, the patient continues to suffer from persistently elevated LFTs as such request made for cholangiogram via the internal/external drainage catheter evaluate for drainage catheter functionality. EXAM: CHOLANGIOGRAM VIA EXISTING INTERNAL/EXTERNAL PERCUTANEOUS BILIARY DRAINAGE CATHETER COMPARISON:  COMPARISON  Ultrasound fluoroscopic guided percutaneous biliary drainage catheter placement - 05/26/2018; CT abdomen and pelvis - 05/19/2018 CONTRAST:  20 cc Isovue-300 administered via the biliary drainage catheter FLUOROSCOPY TIME:  1 minute 12 seconds (8.5 mGy) COMPLICATIONS: None immediate. TECHNIQUE: Informed written consent was obtained from the patient after a discussion of the risks, benefits and alternatives to treatment. Questions regarding the procedure were encouraged and answered. A timeout was performed prior to the initiation of the procedure. Patient was positioned supine on the fluoroscopy table. Preprocedural spot fluoroscopic image was obtained of the right upper abdominal quadrant existing percutaneous drainage catheter. Multiple spot fluoroscopic and radiographic images were obtained in various obliquities following the injection of a small amount contrast. Images reviewed in the procedure was terminated. The percutaneous biliary drainage catheter was flushed and reconnected to a gravity bag. A dressing was placed. The patient tolerated the procedure well without immediate postprocedural complication. FINDINGS: The existing percutaneous biliary catheter is appropriately positioned and functioning. Contrast injection demonstrates brisk passage of contrast through the biliary drainage catheter to the level of the duodenum. There  is no definitive opacification of the intrahepatic biliary tree despite vigorous contrast injection. IMPRESSION: Appropriately positioned and functioning right hepatic approach percutaneous biliary drainage catheter. PLAN: My assumption is the lack of opacification of the intrahepatic biliary tree is secondary to the lack of any significant intrahepatic biliary ductal dilatation as was suggested both on the preceding abdominal CT performed 05/19/2018 as well as the ultrasound and fluoroscopic guided biliary drainage catheter placement performed 05/26/2018. To confirm this assumption,  I will obtain a IV only CT scan of the abdomen. If there is no significant intrahepatic biliary duct dilatation, the etiology of the patient's elevated LFTs is likely secondary to a non obstructive etiology. If intrahepatic biliary duct dilatation is seen on the abdominal CT, would pursue fluoroscopic guided biliary drainage catheter exchange and potential up sizing. Electronically Signed   By: Sandi Mariscal M.D.   On: 06/01/2018 17:33   Ir Int Lianne Cure Biliary Drain With Cholangiogram  Result Date: 05/28/2018 INDICATION: Malignant obstructive jaundice, unsuccessful ERCP stent placement EXAM: FLUOROSCOPIC RIGHT INTERNAL EXTERNAL 10 FRENCH BILIARY DRAIN MEDICATIONS: 3.375 G ZOSYN; The antibiotic was administered within an appropriate time frame prior to the initiation of the procedure. ANESTHESIA/SEDATION: Moderate (conscious) sedation was employed during this procedure. A total of Versed 3.0 mg and Fentanyl 150 mcg was administered intravenously. Moderate Sedation Time: 18 minutes. The patient's level of consciousness and vital signs were monitored continuously by radiology nursing throughout the procedure under my direct supervision. FLUOROSCOPY TIME:  Fluoroscopy Time: 2 minutes 30 seconds (140 mGy). COMPLICATIONS: None immediate. PROCEDURE: Informed written consent was obtained from the patient after a thorough discussion of the procedural risks, benefits and alternatives. All questions were addressed. Maximal Sterile Barrier Technique was utilized including caps, mask, sterile gowns, sterile gloves, sterile drape, hand hygiene and skin antiseptic. A timeout was performed prior to the initiation of the procedure. Under sterile conditions and local anesthesia, percutaneous ultrasound access performed of a peripheral dilated right hepatic duct. Images obtained for documentation. There was return of bile. Contrast injection performed for initial cholangiogram demonstrating mild biliary dilatation. Guidewire advanced  centrally within the biliary tree followed by the Accustick dilator set. Kumpe catheter and guidewire were advanced into the duodenum. Contrast injection confirms position in the duodenum. Amplatz guidewire inserted. Tract dilatation performed to insert a 10 [place] drain. Retention loop formed in the duodenum. Contrast injection confirms drainage of the bile ducts. Catheter secured with a Prolene suture. External gravity drainage bag connected. No immediate complication. Patient tolerated the procedure well. IMPRESSION: Successful ultrasound 10 French right internal external biliary drain. Electronically Signed   By: Jerilynn Mages.  Shick M.D.   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 Patient has an elevated bilirubin despite having an internal/external biliary drain. Recent CT demonstrated mild intrahepatic biliary dilatation. Patient presents for cholangiogram and tube exchange and up sizing. EXAM: CHOLANGIOGRAM THROUGH EXISTING CATHETER EXCHANGE OF BILIARY CATHETER WITH FLUOROSCOPY MEDICATIONS: Zosyn 3.375 g; The antibiotic was administered within an appropriate time frame prior to the initiation of the procedure. ANESTHESIA/SEDATION: Moderate (conscious) sedation was employed during this procedure. A total of Versed 3 mg and Fentanyl 100 mcg was administered intravenously. Moderate Sedation Time: 20 minutes. The patient's level of consciousness and vital signs were monitored continuously by radiology nursing throughout the procedure under my direct supervision. FLUOROSCOPY TIME:  Fluoroscopy Time: 3 minutes 30 seconds (116 mGy). COMPLICATIONS: None immediate. PROCEDURE: Informed written consent was obtained from the patient  after a thorough discussion of the procedural risks, benefits and alternatives. All questions were addressed. Maximal Sterile Barrier Technique was utilized including caps, mask, sterile gowns, sterile gloves,  sterile drape, hand hygiene and skin antiseptic. A timeout was performed prior to the initiation of the procedure. The existing catheter was prepped and draped in sterile fashion. Contrast was injected through the tube. The tube was cut and removed over a [place] wire. A 7 French sheath was advanced into the liver over the wire. Cholangiogram performed through this sheath. A new 12 [place] biliary drain was selected. Two additional sideholes were created. The tube was advanced over the wire with mild difficulty. Tube was advanced into the duodenum. Catheter was injected with contrast and flushed with saline. Skin was anesthetized with 1% lidocaine. Catheter was sutured to skin with suture and attached to gravity bag. FINDINGS: The sheath cholangiogram demonstrated narrowing of the proximal extrahepatic biliary system and central intrahepatic ducts. Opacification of the intrahepatic biliary system. No significant filling of left intrahepatic ducts and similar to the previous cholangiograms. New drain was advanced into the duodenum. There is filling of the right intrahepatic ducts after placement of the new tube. IMPRESSION: Narrowing of the central intrahepatic ducts and proximal extrahepatic biliary system. Not clear if this narrowing is related to sludge or tumor involvement. The drain was successfully exchanged and upsized to 12 [place]. Additional sideholes were created to help with biliary drainage. Electronically Signed   By: Markus Daft M.D.   On: 06/03/2018 15:05   Ir Imaging Guided Port Insertion  Result Date: 05/31/2018 INDICATION: History of metastatic pancreatic cancer, in need of durable intravenous access for chemotherapy administration. EXAM: IMPLANTED PORT A CATH PLACEMENT WITH ULTRASOUND AND FLUOROSCOPIC GUIDANCE COMPARISON:  CT the chest, abdomen pelvis - 05/27/2018 MEDICATIONS: Cleocin 900 mg IV; The antibiotic was administered within an appropriate time interval prior to skin puncture.  ANESTHESIA/SEDATION: Moderate (conscious) sedation was employed during this procedure. A total of Versed 2 mg and Fentanyl 100 mcg was administered intravenously. Moderate Sedation Time: 27 minutes. The patient's level of consciousness and vital signs were monitored continuously by radiology nursing throughout the procedure under my direct supervision. CONTRAST:  None FLUOROSCOPY TIME:  12 seconds (9 mGy) COMPLICATIONS: None immediate. PROCEDURE: The procedure, risks, benefits, and alternatives were explained to the patient. Questions regarding the procedure were encouraged and answered. The patient understands and consents to the procedure. The right neck and chest were prepped with chlorhexidine in a sterile fashion, and a sterile drape was applied covering the operative field. Maximum barrier sterile technique with sterile gowns and gloves were used for the procedure. A timeout was performed prior to the initiation of the procedure. Local anesthesia was provided with 1% lidocaine with epinephrine. After creating a small venotomy incision, a micropuncture kit was utilized to access the internal jugular vein. Real-time ultrasound guidance was utilized for vascular access including the acquisition of a permanent ultrasound image documenting patency of the accessed vessel. The microwire was utilized to measure appropriate catheter length. A subcutaneous port pocket was then created along the upper chest wall utilizing a combination of sharp and blunt dissection. The pocket was irrigated with sterile saline. A single lumen thin power injectable port was chosen for placement. The 8 Fr catheter was tunneled from the port pocket site to the venotomy incision. The port was placed in the pocket. The external catheter was trimmed to appropriate length. At the venotomy, an 8 Fr peel-away sheath was placed over a guidewire under  fluoroscopic guidance. The catheter was then placed through the sheath and the sheath was removed.  Final catheter positioning was confirmed and documented with a fluoroscopic spot radiograph. The port was accessed with a Huber needle, aspirated and flushed with heparinized saline. The venotomy site was closed with an interrupted 4-0 Vicryl suture. The port pocket incision was closed with interrupted 2-0 Vicryl suture and the skin was opposed with a running subcuticular 4-0 Vicryl suture. Dermabond and Steri-strips were applied to both incisions. Dressings were placed. The patient tolerated the procedure well without immediate post procedural complication. FINDINGS: After catheter placement, the tip lies within the superior cavoatrial junction. The catheter aspirates and flushes normally and is ready for immediate use. IMPRESSION: Successful placement of a right internal jugular approach power injectable Port-A-Cath. The catheter is ready for immediate use. Electronically Signed   By: Sandi Mariscal M.D.   On: 05/31/2018 11:41       Subjective: No chest pain or sob.   Discharge Exam: Vitals:   06/15/18 2129 06/16/18 0537  BP: 116/79 110/85  Pulse: 71 66  Resp: 16 17  Temp: (!) 97.4 F (36.3 C) 97.6 F (36.4 C)  SpO2: 100% 100%   Vitals:   Jul 06, 2018 2147 06/15/18 0601 06/15/18 2129 06/16/18 0537  BP: 117/79 120/84 116/79 110/85  Pulse: 69 68 71 66  Resp:  '15 16 17  '$ Temp: 97.8 F (36.6 C) 97.6 F (36.4 C) (!) 97.4 F (36.3 C) 97.6 F (36.4 C)  TempSrc: Oral Oral Oral Oral  SpO2: 98% 99% 100% 100%  Weight:  88.4 kg (194 lb 14.2 oz) 86.2 kg (190 lb 0.6 oz) 86.2 kg (190 lb 0.6 oz)  Height:        General: Pt is alert, awake, not in acute distress Cardiovascular: RRR, S1/S2 +, no rubs, no gallops Respiratory: CTA bilaterally, no wheezing, no rhonchi Abdominal: Soft, NT, ND, bowel sounds + Extremities: no edema, no cyanosis    The results of significant diagnostics from this hospitalization (including imaging, microbiology, ancillary and laboratory) are listed below for reference.      Microbiology: Recent Results (from the past 240 hour(s))  Culture, blood (routine x 2)     Status: None   Collection Time: 06/09/18  1:03 PM  Result Value Ref Range Status   Specimen Description   Final    BLOOD LEFT ANTECUBITAL Performed at [facility] [address], [place], [place] [address]    Special Requests   Final    BOTTLES DRAWN AEROBIC AND ANAEROBIC Blood Culture adequate volume Performed at [facility] [address] [place], [place] [address]    Culture   Final    NO GROWTH 5 DAYS Performed at [facility], [place] [address] [place], [place] [address]    Report Status 07-06-18 FINAL  Final  Culture, blood (routine x 2)     Status: None   Collection Time: 06/09/18  1:03 PM  Result Value Ref Range Status   Specimen Description   Final    BLOOD PORTA CATH Performed at [facility] [address], [place], [place] [address]    Special Requests   Final    BOTTLES DRAWN AEROBIC ONLY Blood Culture adequate volume Performed at [facility] [address] [place], [place] [address]    Culture   Final    NO GROWTH 5 DAYS Performed at [facility], [place] [address], [place], [place] [address]  Report Status 07/05/2018 FINAL  Final  MRSA PCR Screening     Status: None   Collection Time: 06/11/18  6:50 PM  Result Value Ref Range Status   MRSA by PCR NEGATIVE NEGATIVE Final    Comment:        The GeneXpert MRSA Assay (FDA approved for NASAL specimens only), is one component of a comprehensive MRSA colonization surveillance program. It is not intended to diagnose MRSA infection nor to guide or monitor treatment for MRSA infections. Performed at [facility], [place] [address], [place], [place] [address]   Body fluid culture     Status: None (Preliminary result)   Collection Time: 07/05/2018  3:45 PM  Result Value Ref Range Status   Specimen  Description   Final    BILE Performed at [facility], [place] [address] [place], [place] [address]    Special Requests   Final    Normal Performed at [facility], [place] [address] [place], [place] [address]    Gram Stain   Final    FEW WBC PRESENT, PREDOMINANTLY MONONUCLEAR ABUNDANT GRAM POSITIVE COCCI ABUNDANT GRAM NEGATIVE RODS ABUNDANT GRAM VARIABLE ROD    Culture   Final    CULTURE REINCUBATED FOR BETTER GROWTH Performed at [facility], [place] [address] [place], [place] [address]    Report Status PENDING  Incomplete     Labs: BNP (last 3 results) Recent Labs    06/09/18 1254  BNP 384.6*   Basic Metabolic Panel: Recent Labs  Lab 06/11/18 0447 06/12/18 0315 06/13/18 0341 2018/07/05 0758 06/16/18 0727  NA 136 140 138 135 139  K 3.6 4.3 4.5 3.9 3.8  CL 100 105 102 96* 100  CO2 '26 24 28 27 29  '$ GLUCOSE 108* 105* 106* 109* 115*  BUN '11 10 11 8 7  '$ CREATININE 0.72 0.73 0.89 0.77 0.74  CALCIUM 8.3* 8.5* 8.6* 8.6* 8.7*  MG 2.0 2.1 2.1 2.1  --    Liver Function Tests: Recent Labs  Lab 06/11/18 0447 06/12/18 0315 06/13/18 0341 07/05/2018 0758 06/16/18 0727  AST 115* 114* 107* 122* 115*  ALT 112* 113* 102* 106* 102*  ALKPHOS 359* 360* 383* 437* 406*  BILITOT 6.3* 6.6* 6.3* 6.1* 5.9*  PROT 6.2* 6.5 6.6 7.0 6.7  ALBUMIN 2.5* 2.5* 2.6* 2.7* 2.6*   No results for input(s): LIPASE, AMYLASE in the last 168 hours. No results for input(s): AMMONIA in the last 168 hours. CBC: Recent Labs  Lab 06/11/18 0447 06/12/18 0315 06/13/18 0341 07/05/18 0758  WBC 10.8* 9.8 10.5 9.7  HGB 9.2* 9.5* 9.4* 10.1*  HCT 29.2* 29.2* 29.4* 31.6*  MCV 94.8 93.6 93.6 93.8  PLT 473* 499* 522* 533*   Cardiac Enzymes: No results for input(s): CKTOTAL, CKMB, CKMBINDEX, TROPONINI in the last 168 hours. BNP: Invalid input(s): POCBNP CBG: No results for input(s): GLUCAP in the last 168 hours. D-Dimer No results for input(s): DDIMER in the last  72 hours. Hgb A1c No results for input(s): HGBA1C in the last 72 hours. Lipid Profile No results for input(s): CHOL, HDL, LDLCALC, TRIG, CHOLHDL, LDLDIRECT in the last 72 hours. Thyroid function studies No results for input(s): TSH, T4TOTAL, T3FREE, THYROIDAB in the last 72 hours.  Invalid input(s): FREET3 Anemia work up No results for input(s): VITAMINB12, FOLATE, FERRITIN, TIBC, IRON, RETICCTPCT in the last 72 hours. Urinalysis    Component Value Date/Time   COLORURINE AMBER (A) 06/09/2018 1506   APPEARANCEUR CLEAR 06/09/2018  1506   LABSPEC 1.024 06/09/2018 1506   PHURINE 5.0 06/09/2018 1506   GLUCOSEU NEGATIVE 06/09/2018 1506   HGBUR NEGATIVE 06/09/2018 1506   HGBUR negative 09/21/2009 1223   BILIRUBINUR SMALL (A) 06/09/2018 1506   BILIRUBINUR n 04/03/2015 [name] 06/09/2018 1506   PROTEINUR NEGATIVE 06/09/2018 1506   UROBILINOGEN 0.2 04/03/2015 1041   UROBILINOGEN 0.2 09/21/2009 1223   NITRITE NEGATIVE 06/09/2018 1506   LEUKOCYTESUR NEGATIVE 06/09/2018 1506   Sepsis Labs Invalid input(s): PROCALCITONIN,  WBC,  LACTICIDVEN Microbiology Recent Results (from the past 240 hour(s))  Culture, blood (routine x 2)     Status: None   Collection Time: 06/09/18  1:03 PM  Result Value Ref Range Status   Specimen Description   Final    BLOOD LEFT ANTECUBITAL Performed at [facility], [place] [address] [place], [place] [address]    Special Requests   Final    BOTTLES DRAWN AEROBIC AND ANAEROBIC Blood Culture adequate volume Performed at [facility] [address], [place], [place] [address]    Culture   Final    NO GROWTH 5 DAYS Performed at [facility], [place] [address], [place], [place] [address]    Report Status July 07, 2018 FINAL  Final  Culture, blood (routine x 2)     Status: None   Collection Time: 06/09/18  1:03 PM  Result Value Ref Range Status   Specimen Description   Final    BLOOD PORTA CATH Performed  at [facility] [address], [place], [place] [address]    Special Requests   Final    BOTTLES DRAWN AEROBIC ONLY Blood Culture adequate volume Performed at [facility] [address], [place], [place] [address]    Culture   Final    NO GROWTH 5 DAYS Performed at [facility], [place] [address] [place], [place] [address]    Report Status 07-Jul-2018 FINAL  Final  MRSA PCR Screening     Status: None   Collection Time: 06/11/18  6:50 PM  Result Value Ref Range Status   MRSA by PCR NEGATIVE NEGATIVE Final    Comment:        The GeneXpert MRSA Assay (FDA approved for NASAL specimens only), is one component of a comprehensive MRSA colonization surveillance program. It is not intended to diagnose MRSA infection nor to guide or monitor treatment for MRSA infections. Performed at [facility], [place] [address], [place], [place] [address]   Body fluid culture     Status: None (Preliminary result)   Collection Time: 07/07/18  3:45 PM  Result Value Ref Range Status   Specimen Description   Final    BILE Performed at [facility], [place] [address], [place], [place] [address]    Special Requests   Final    Normal Performed at [facility], [place] [address], [place], [place] [address]    Gram Stain   Final    FEW WBC PRESENT, PREDOMINANTLY MONONUCLEAR ABUNDANT GRAM POSITIVE COCCI ABUNDANT GRAM NEGATIVE RODS ABUNDANT GRAM VARIABLE ROD    Culture   Final    CULTURE REINCUBATED FOR BETTER GROWTH Performed at [facility], [place] [address], [place], [place] [address]    Report Status PENDING  Incomplete     Time coordinating discharge:35  minutes  SIGNED:   Hosie Poisson, MD  [facility] 06/17/2018, 9:58 AM Pager   If  7PM-7AM, please contact night-coverage [email or web address] Password [email or web address]

## 2018-06-18 DIAGNOSIS — R0600 Dyspnea, unspecified: Secondary | ICD-10-CM

## 2018-06-19 ENCOUNTER — Other Ambulatory Visit: Payer: Self-pay | Admitting: Oncology

## 2018-06-21 ENCOUNTER — Inpatient Hospital Stay: Payer: BLUE CROSS/BLUE SHIELD

## 2018-06-21 ENCOUNTER — Inpatient Hospital Stay: Payer: BLUE CROSS/BLUE SHIELD | Attending: Hematology & Oncology | Admitting: Hematology & Oncology

## 2018-06-21 ENCOUNTER — Encounter: Payer: Self-pay | Admitting: Hematology & Oncology

## 2018-06-21 ENCOUNTER — Telehealth: Payer: Self-pay | Admitting: *Deleted

## 2018-06-21 ENCOUNTER — Other Ambulatory Visit: Payer: Self-pay

## 2018-06-21 VITALS — BP 115/85 | HR 85 | Temp 97.6°F | Resp 19 | Wt 177.1 lb

## 2018-06-21 DIAGNOSIS — Z86718 Personal history of other venous thrombosis and embolism: Secondary | ICD-10-CM

## 2018-06-21 DIAGNOSIS — R530 Neoplastic (malignant) related fatigue: Secondary | ICD-10-CM

## 2018-06-21 DIAGNOSIS — M2550 Pain in unspecified joint: Secondary | ICD-10-CM

## 2018-06-21 DIAGNOSIS — Z79899 Other long term (current) drug therapy: Secondary | ICD-10-CM | POA: Insufficient documentation

## 2018-06-21 DIAGNOSIS — D508 Other iron deficiency anemias: Secondary | ICD-10-CM | POA: Diagnosis not present

## 2018-06-21 DIAGNOSIS — Z86711 Personal history of pulmonary embolism: Secondary | ICD-10-CM | POA: Diagnosis not present

## 2018-06-21 DIAGNOSIS — R0602 Shortness of breath: Secondary | ICD-10-CM | POA: Diagnosis not present

## 2018-06-21 DIAGNOSIS — M791 Myalgia, unspecified site: Secondary | ICD-10-CM | POA: Diagnosis not present

## 2018-06-21 DIAGNOSIS — C169 Malignant neoplasm of stomach, unspecified: Secondary | ICD-10-CM | POA: Diagnosis not present

## 2018-06-21 DIAGNOSIS — R112 Nausea with vomiting, unspecified: Secondary | ICD-10-CM

## 2018-06-21 DIAGNOSIS — R109 Unspecified abdominal pain: Secondary | ICD-10-CM | POA: Diagnosis not present

## 2018-06-21 DIAGNOSIS — K831 Obstruction of bile duct: Secondary | ICD-10-CM | POA: Insufficient documentation

## 2018-06-21 DIAGNOSIS — G893 Neoplasm related pain (acute) (chronic): Secondary | ICD-10-CM | POA: Diagnosis not present

## 2018-06-21 DIAGNOSIS — K838 Other specified diseases of biliary tract: Secondary | ICD-10-CM

## 2018-06-21 DIAGNOSIS — R110 Nausea: Secondary | ICD-10-CM | POA: Insufficient documentation

## 2018-06-21 DIAGNOSIS — R634 Abnormal weight loss: Secondary | ICD-10-CM | POA: Diagnosis not present

## 2018-06-21 DIAGNOSIS — C801 Malignant (primary) neoplasm, unspecified: Secondary | ICD-10-CM

## 2018-06-21 DIAGNOSIS — Z7901 Long term (current) use of anticoagulants: Secondary | ICD-10-CM | POA: Insufficient documentation

## 2018-06-21 DIAGNOSIS — C799 Secondary malignant neoplasm of unspecified site: Secondary | ICD-10-CM

## 2018-06-21 DIAGNOSIS — K8689 Other specified diseases of pancreas: Secondary | ICD-10-CM

## 2018-06-21 DIAGNOSIS — K3189 Other diseases of stomach and duodenum: Secondary | ICD-10-CM

## 2018-06-21 DIAGNOSIS — C7889 Secondary malignant neoplasm of other digestive organs: Secondary | ICD-10-CM | POA: Diagnosis not present

## 2018-06-21 LAB — CBC WITH DIFFERENTIAL (CANCER CENTER ONLY)
Basophils Absolute: 0 10*3/uL (ref 0.0–0.1)
Basophils Relative: 0 %
EOS ABS: 0.1 10*3/uL (ref 0.0–0.5)
EOS PCT: 1 %
HCT: 33.7 % — ABNORMAL LOW (ref 38.7–49.9)
Hemoglobin: 10.5 g/dL — ABNORMAL LOW (ref 13.0–17.1)
LYMPHS ABS: 0.8 10*3/uL — AB (ref 0.9–3.3)
LYMPHS PCT: 10 %
MCH: 28.5 pg (ref 28.0–33.4)
MCHC: 31.2 g/dL — AB (ref 32.0–35.9)
MCV: 91.3 fL (ref 82.0–98.0)
MONOS PCT: 13 %
Monocytes Absolute: 0.9 10*3/uL (ref 0.1–0.9)
Neutro Abs: 5.4 10*3/uL (ref 1.5–6.5)
Neutrophils Relative %: 76 %
PLATELETS: 528 10*3/uL — AB (ref 145–400)
RBC: 3.69 MIL/uL — ABNORMAL LOW (ref 4.20–5.70)
RDW: 14.9 % (ref 11.1–15.7)
WBC Count: 7.2 10*3/uL (ref 4.0–10.0)

## 2018-06-21 LAB — CMP (CANCER CENTER ONLY)
ALK PHOS: 462 U/L — AB (ref 26–84)
ALT: 108 U/L — AB (ref 10–47)
ANION GAP: 8 (ref 5–15)
AST: 119 U/L — ABNORMAL HIGH (ref 11–38)
Albumin: 3.2 g/dL — ABNORMAL LOW (ref 3.5–5.0)
BUN: 10 mg/dL (ref 7–22)
CHLORIDE: 95 mmol/L — AB (ref 98–108)
CO2: 32 mmol/L (ref 18–33)
Calcium: 9.4 mg/dL (ref 8.0–10.3)
Creatinine: 0.9 mg/dL (ref 0.60–1.20)
GLUCOSE: 143 mg/dL — AB (ref 73–118)
POTASSIUM: 4.1 mmol/L (ref 3.3–4.7)
SODIUM: 135 mmol/L (ref 128–145)
TOTAL PROTEIN: 7.7 g/dL (ref 6.4–8.1)
Total Bilirubin: 5.8 mg/dL (ref 0.2–1.6)

## 2018-06-21 LAB — BILIRUBIN, DIRECT: BILIRUBIN DIRECT: 3.5 mg/dL — AB (ref 0.0–0.2)

## 2018-06-21 LAB — LACTATE DEHYDROGENASE: LDH: 331 U/L — AB (ref 98–192)

## 2018-06-21 MED ORDER — LORAZEPAM 1 MG PO TABS: 1.0000 mg | ORAL_TABLET | Freq: Four times a day (QID) | ORAL | 0 refills | Status: AC | PRN

## 2018-06-21 MED ORDER — PROCHLORPERAZINE MALEATE 10 MG PO TABS: 10.0000 mg | ORAL_TABLET | Freq: Four times a day (QID) | ORAL | 3 refills | Status: AC | PRN

## 2018-06-21 MED ORDER — DRONABINOL 5 MG PO CAPS: 5.0000 mg | ORAL_CAPSULE | Freq: Two times a day (BID) | ORAL | 0 refills | Status: AC

## 2018-06-21 NOTE — Telephone Encounter (Signed)
Critical Value Total Bilirubin 5.8 Dr Marin Olp notified. No orders at this time.

## 2018-06-21 NOTE — Progress Notes (Signed)
Hematology and Oncology Follow Up Visit  [name] [id number] 06-Jan-1961 57 y.o. 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  Unfortunately, he was readmitted after we last saw him.  He developed extensive pulmonary emboli with right heart strain.  He had bilateral lower extremity DVT.  He was on heparin.  He now is on Lovenox.  He is on twice a day Lovenox.  I clearly believe that the thromboembolic disease is from his underlying malignancy.  Of note, our molecular analysis came back on him.  It really does not look good.  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  He has MSI stable.  His PD-L1 is negative.  He has a TMB that is low.  He does not want to be treated today.  He says that he just does not feel like he can take treatment today.  He is on supplemental oxygen.  He still has the external biliary drainage catheter.  It is draining clear bile.  He has had no fever.  He has had no bleeding.  He has had no appetite.  Somehow, his Marinol was discontinued.  I told him to restart the Marinol.  I think this is incredibly important.  [name].  He was discharged on June 21.  He still has the biliary drainage bag attached.  He is having quite a bit of pain.  He is on a fentanyl patch.  He is on oxycodone.  He was told to take the oxycodone every 8 hours.  This is clearly not adequate for him.  We we will make sure that he takes the oxycodone every 4 hours if needed.  I offered to increase the fentanyl patch.  He is okay with the current dosage.  So I first saw him, his CA Newman-9 was 23,000.  His appetite is slightly better.  I think his appetite will improve if he gets better pain control.  In addition, I will call in some Marinol for him.  I will try 5 mg  p.o. twice daily dosing.  He still having quite a bit of pain.  He is on a fentanyl patch at 25 mcg.  He does not want to have this increased.  He is on oxycodone at 5 mg.  He does not want to have this changed.  His liver function studies are improving slowly.  His CA Newman-9 unfortunately has not gone down.  We checked it a couple weeks ago, it was 26,700.  His CEA was 48.  I will have to say that right now, his performance status is ECOG 2-3.  Medications:  Current Outpatient Medications:  .  cetirizine (ZYRTEC) 10 MG tablet, Take 10 mg by mouth daily.  , Disp: , Rfl:  .  Cyanocobalamin (VITAMIN B 12 PO), Take 1,000 mcg by mouth daily., Disp: , Rfl:  .  enoxaparin (LOVENOX) 100 MG/ML injection, Inject 0.9 mLs (90 mg total) into the skin every 12 (twelve) hours., Disp: 60 Syringe, Rfl: 0 .  esomeprazole (NEXIUM) 20 MG capsule, TAKE ONE CAPSULE BY MOUTH EVERY DAY, Disp: 100 capsule, Rfl: 3 .  fentaNYL (DURAGESIC - DOSED MCG/HR) 25 MCG/HR patch, Place 1 patch (25 mcg total) onto the skin every 3 (three) days., Disp: 5 patch, Rfl:  0 .  flintstones complete (FLINTSTONES) 60 MG chewable tablet, Chew 1 tablet by mouth daily., Disp: , Rfl:  .  fluconazole (DIFLUCAN) 200 MG tablet, Take 1 tablet (200 mg total) by mouth daily., Disp: 18 tablet, Rfl: 0 .  hydrocortisone cream 1 %, Apply 1 application topically daily as needed for itching., Disp: , Rfl:  .  LORazepam (ATIVAN) 1 MG tablet, Take 1 tablet (1 mg total) by mouth every 6 (six) hours as needed for anxiety., Disp: 60 tablet, Rfl: 0 .  ondansetron (ZOFRAN-ODT) 4 MG disintegrating tablet, Take 4 mg by mouth., Disp: , Rfl: 0 .  oxyCODONE (OXY IR/ROXICODONE) 5 MG immediate release tablet, Take 1 tablet (5 mg total) by mouth every 8 (eight) hours as needed for severe pain or breakthrough pain., Disp: 120 tablet, Rfl: 0 .  polyethylene glycol (MIRALAX / GLYCOLAX) packet, Take 17 g by mouth daily., Disp: , Rfl:  .  prochlorperazine (COMPAZINE) 10 MG  tablet, Take 1 tablet (10 mg total) by mouth every 6 (six) hours as needed for nausea or vomiting., Disp: 120 tablet, Rfl: 3  Allergies:  Allergies  Allergen Reactions  . Cephalexin Other (See Comments)    "Sore throat" REACTION: SORE THROAT  . Other     Bee sting  . Latex Rash  . Tape Rash    Past Medical History, Surgical history, Social history, and Family History were reviewed and updated.  Review of Systems: Review of Systems  Constitutional: Positive for fatigue.  HENT:  Negative.   Eyes: Positive for icterus.  Respiratory: Positive for shortness of breath.   Cardiovascular: Negative.   Gastrointestinal: Positive for abdominal pain, nausea and vomiting.  Endocrine: Negative.   Genitourinary: Negative.    Musculoskeletal: Positive for arthralgias and myalgias.  Skin: Negative.   Neurological: Negative.   Hematological: Negative.   Psychiatric/Behavioral: Negative.     Physical Exam:  weight is 177 lb 1.3 oz (80.3 kg). His oral temperature is 97.6 F (36.4 C). His blood pressure is 115/85 and his pulse is 85. His respiration is Newman and oxygen saturation is 100%.   Wt Readings from Last 3 Encounters:  07/08/Newman 177 lb 1.3 oz (80.3 kg)  07/03/Newman 190 lb 0.6 oz (86.2 kg)  06/25/Newman 189 lb (85.7 kg)    Physical Exam  Constitutional: He is oriented to person, place, and time.  HENT:  Head: Normocephalic and atraumatic.  Mouth/Throat: Oropharynx is clear and moist.  Eyes: Pupils are equal, round, and reactive to light. EOM are normal.  Neck: Normal range of motion.  Cardiovascular: Normal rate, regular rhythm and normal heart sounds.  Pulmonary/Chest: Effort normal and breath sounds normal.  Abdominal: Soft. Bowel sounds are normal.  He has the biliary drainage catheter on the right side of his abdomen.  It is draining dark clear bile.  Musculoskeletal: Normal range of motion. He exhibits no edema, tenderness or deformity.  Lymphadenopathy:    He has no cervical  adenopathy.  Neurological: He is alert and oriented to person, place, and time.  Skin: Skin is warm and dry. No rash noted. No erythema.  Psychiatric: He has a normal mood and affect. His behavior is normal. Judgment and thought content normal.  Vitals reviewed.    Lab Results  Component Value Date   WBC 7.2 06/21/2018   HGB 10.5 (L) 06/21/2018   HCT 33.7 (L) 06/21/2018   MCV 91.3 06/21/2018   PLT 528 (H) 06/21/2018     Chemistry  Component Value Date/Time   NA 135 06/21/2018 1045   K 4.1 06/21/2018 1045   CL 95 (L) 06/21/2018 1045   CO2 32 06/21/2018 1045   BUN 10 06/21/2018 1045   CREATININE 0.90 06/21/2018 1045      Component Value Date/Time   CALCIUM 9.4 06/21/2018 1045   ALKPHOS 462 (H) 06/21/2018 1045   AST 119 (H) 06/21/2018 1045   ALT 108 (H) 06/21/2018 1045   BILITOT 5.8 (HH) 06/21/2018 1045       Impression and Plan: [name] is a 58 year old white male.  He has metastatic gastric cancer.  His CA Newman-9 was incredibly high.  We are clearly dealing with quality of life issues.  I do not have any problem with respect to holding his treatment for a week.  I think this would be reasonable since he just got out of the hospital on July 3.  I just we wish that his performance status and quality of life would improve.  He will continue the Lovenox at twice a day dosing.  I think with the extensive nature of his thromboembolic disease, I just do not feel confident putting him on a NOAC.  We will see him back in 1 week.  I have taken his iron studies.  I suspect that he probably is iron deficient.  He has gotten IV iron in the hospital.   Volanda Napoleon, MD 7/8/20194:59 PM

## 2018-06-21 NOTE — Patient Instructions (Signed)
Implanted Port Home Guide An implanted port is a type of central line that is placed under the skin. Central lines are used to provide IV access when treatment or nutrition needs to be given through a person's veins. Implanted ports are used for long-term IV access. An implanted port may be placed because:  You need IV medicine that would be irritating to the small veins in your hands or arms.  You need long-term IV medicines, such as antibiotics.  You need IV nutrition for a long period.  You need frequent blood draws for lab tests.  You need dialysis.  Implanted ports are usually placed in the chest area, but they can also be placed in the upper arm, the abdomen, or the leg. An implanted port has two main parts:  Reservoir. The reservoir is round and will appear as a small, raised area under your skin. The reservoir is the part where a needle is inserted to give medicines or draw blood.  Catheter. The catheter is a thin, flexible tube that extends from the reservoir. The catheter is placed into a large vein. Medicine that is inserted into the reservoir goes into the catheter and then into the vein.  How will I care for my incision site? Do not get the incision site wet. Bathe or shower as directed by your health care provider. How is my port accessed? Special steps must be taken to access the port:  Before the port is accessed, a numbing cream can be placed on the skin. This helps numb the skin over the port site.  Your health care provider uses a sterile technique to access the port. ? Your health care provider must put on a mask and sterile gloves. ? The skin over your port is cleaned carefully with an antiseptic and allowed to dry. ? The port is gently pinched between sterile gloves, and a needle is inserted into the port.  Only "non-coring" port needles should be used to access the port. Once the port is accessed, a blood return should be checked. This helps ensure that the port  is in the vein and is not clogged.  If your port needs to remain accessed for a constant infusion, a clear (transparent) bandage will be placed over the needle site. The bandage and needle will need to be changed every week, or as directed by your health care provider.  Keep the bandage covering the needle clean and dry. Do not get it wet. Follow your health care provider's instructions on how to take a shower or bath while the port is accessed.  If your port does not need to stay accessed, no bandage is needed over the port.  What is flushing? Flushing helps keep the port from getting clogged. Follow your health care provider's instructions on how and when to flush the port. Ports are usually flushed with saline solution or a medicine called heparin. The need for flushing will depend on how the port is used.  If the port is used for intermittent medicines or blood draws, the port will need to be flushed: ? After medicines have been given. ? After blood has been drawn. ? As part of routine maintenance.  If a constant infusion is running, the port may not need to be flushed.  How long will my port stay implanted? The port can stay in for as long as your health care provider thinks it is needed. When it is time for the port to come out, surgery will be   done to remove it. The procedure is similar to the one performed when the port was put in. When should I seek immediate medical care? When you have an implanted port, you should seek immediate medical care if:  You notice a bad smell coming from the incision site.  You have swelling, redness, or drainage at the incision site.  You have more swelling or pain at the port site or the surrounding area.  You have a fever that is not controlled with medicine.  This information is not intended to replace advice given to you by your health care provider. Make sure you discuss any questions you have with your health care provider. Document  Released: 12/01/2005 Document Revised: 05/08/2016 Document Reviewed: 08/08/2013 Elsevier Interactive Patient Education  2017 [employer]  

## 2018-06-22 LAB — IRON AND TIBC
Iron: 30 ug/dL — ABNORMAL LOW (ref 42–163)
SATURATION RATIOS: 9 % — AB (ref 42–163)
TIBC: 329 ug/dL (ref 202–409)
UIBC: 299 ug/dL

## 2018-06-22 LAB — FERRITIN: Ferritin: 436 ng/mL — ABNORMAL HIGH (ref 24–336)

## 2018-06-22 LAB — CANCER ANTIGEN 19-9: CAN 19-9: 44965 U/mL — AB (ref 0–35)

## 2018-06-23 ENCOUNTER — Inpatient Hospital Stay: Payer: BLUE CROSS/BLUE SHIELD

## 2018-06-25 ENCOUNTER — Other Ambulatory Visit: Payer: Self-pay | Admitting: *Deleted

## 2018-06-25 DIAGNOSIS — C801 Malignant (primary) neoplasm, unspecified: Secondary | ICD-10-CM

## 2018-06-25 DIAGNOSIS — K831 Obstruction of bile duct: Principal | ICD-10-CM

## 2018-06-25 DIAGNOSIS — K8689 Other specified diseases of pancreas: Secondary | ICD-10-CM

## 2018-06-25 MED ORDER — FENTANYL 25 MCG/HR TD PT72: 25.0000 ug | MEDICATED_PATCH | TRANSDERMAL | 0 refills | Status: AC

## 2018-06-25 MED ORDER — FLUCONAZOLE 200 MG PO TABS
200.0000 mg | ORAL_TABLET | Freq: Every day | ORAL | 0 refills | Status: AC
Start: 2018-06-25 — End: ?

## 2018-06-26 ENCOUNTER — Telehealth: Payer: Self-pay | Admitting: Radiology

## 2018-06-26 NOTE — Progress Notes (Signed)
Pt father called reporting I/E biliary drain would not flush last pm. Output also has slowed down. Does not appear to have come out or retracted. No pain or l;eakage around drain at skin site.  Advised okay to flush with a little more force or use flush/aspirate technique to attempt to clear any obstruction. If still unsuccessful, will likely need to come in Monday to assess. Pt has appt with Dr. Marin Olp on Monday for treatment.  If pt develops pain or leakage around site, he may call back for further instructions.   Ascencion Dike PA-C Interventional Radiology 06/26/2018 12:19 PM

## 2018-06-28 ENCOUNTER — Other Ambulatory Visit: Payer: Self-pay

## 2018-06-28 ENCOUNTER — Encounter: Payer: Self-pay | Admitting: Family

## 2018-06-28 ENCOUNTER — Inpatient Hospital Stay: Payer: BLUE CROSS/BLUE SHIELD

## 2018-06-28 ENCOUNTER — Inpatient Hospital Stay (HOSPITAL_BASED_OUTPATIENT_CLINIC_OR_DEPARTMENT_OTHER): Payer: BLUE CROSS/BLUE SHIELD | Admitting: Family

## 2018-06-28 ENCOUNTER — Telehealth: Payer: Self-pay | Admitting: *Deleted

## 2018-06-28 VITALS — BP 116/76 | HR 84 | Temp 98.0°F | Resp 18 | Wt 166.4 lb

## 2018-06-28 VITALS — BP 109/62

## 2018-06-28 DIAGNOSIS — K831 Obstruction of bile duct: Secondary | ICD-10-CM

## 2018-06-28 DIAGNOSIS — C799 Secondary malignant neoplasm of unspecified site: Principal | ICD-10-CM

## 2018-06-28 DIAGNOSIS — Z86711 Personal history of pulmonary embolism: Secondary | ICD-10-CM

## 2018-06-28 DIAGNOSIS — Z7901 Long term (current) use of anticoagulants: Secondary | ICD-10-CM

## 2018-06-28 DIAGNOSIS — D508 Other iron deficiency anemias: Secondary | ICD-10-CM

## 2018-06-28 DIAGNOSIS — R0602 Shortness of breath: Secondary | ICD-10-CM

## 2018-06-28 DIAGNOSIS — K3189 Other diseases of stomach and duodenum: Secondary | ICD-10-CM

## 2018-06-28 DIAGNOSIS — R109 Unspecified abdominal pain: Secondary | ICD-10-CM

## 2018-06-28 DIAGNOSIS — R530 Neoplastic (malignant) related fatigue: Secondary | ICD-10-CM

## 2018-06-28 DIAGNOSIS — R634 Abnormal weight loss: Secondary | ICD-10-CM

## 2018-06-28 DIAGNOSIS — C7889 Secondary malignant neoplasm of other digestive organs: Secondary | ICD-10-CM | POA: Diagnosis not present

## 2018-06-28 DIAGNOSIS — D509 Iron deficiency anemia, unspecified: Secondary | ICD-10-CM | POA: Insufficient documentation

## 2018-06-28 DIAGNOSIS — C169 Malignant neoplasm of stomach, unspecified: Secondary | ICD-10-CM | POA: Diagnosis not present

## 2018-06-28 DIAGNOSIS — Z79899 Other long term (current) drug therapy: Secondary | ICD-10-CM

## 2018-06-28 DIAGNOSIS — G893 Neoplasm related pain (acute) (chronic): Secondary | ICD-10-CM

## 2018-06-28 DIAGNOSIS — R110 Nausea: Secondary | ICD-10-CM

## 2018-06-28 DIAGNOSIS — Z86718 Personal history of other venous thrombosis and embolism: Secondary | ICD-10-CM

## 2018-06-28 DIAGNOSIS — R112 Nausea with vomiting, unspecified: Secondary | ICD-10-CM

## 2018-06-28 LAB — CBC WITH DIFFERENTIAL (CANCER CENTER ONLY)
Basophils Absolute: 0 10*3/uL (ref 0.0–0.1)
Basophils Relative: 0 %
EOS PCT: 0 %
Eosinophils Absolute: 0 10*3/uL (ref 0.0–0.5)
HCT: 29.2 % — ABNORMAL LOW (ref 38.7–49.9)
HEMOGLOBIN: 9 g/dL — AB (ref 13.0–17.1)
LYMPHS ABS: 0.8 10*3/uL — AB (ref 0.9–3.3)
LYMPHS PCT: 8 %
MCH: 28 pg (ref 28.0–33.4)
MCHC: 30.8 g/dL — ABNORMAL LOW (ref 32.0–35.9)
MCV: 90.7 fL (ref 82.0–98.0)
MONOS PCT: 10 %
Monocytes Absolute: 1 10*3/uL — ABNORMAL HIGH (ref 0.1–0.9)
NEUTROS PCT: 82 %
Neutro Abs: 8.1 10*3/uL — ABNORMAL HIGH (ref 1.5–6.5)
Platelet Count: 536 10*3/uL — ABNORMAL HIGH (ref 145–400)
RBC: 3.22 MIL/uL — ABNORMAL LOW (ref 4.20–5.70)
RDW: 14.5 % (ref 11.1–15.7)
WBC: 9.9 10*3/uL (ref 4.0–10.0)

## 2018-06-28 LAB — CMP (CANCER CENTER ONLY)
ALK PHOS: 641 U/L — AB (ref 26–84)
ALT: 126 U/L — ABNORMAL HIGH (ref 10–47)
ANION GAP: 11 (ref 5–15)
AST: 138 U/L — ABNORMAL HIGH (ref 11–38)
Albumin: 3.2 g/dL — ABNORMAL LOW (ref 3.5–5.0)
BUN: 20 mg/dL (ref 7–22)
CO2: 31 mmol/L (ref 18–33)
Calcium: 9.4 mg/dL (ref 8.0–10.3)
Chloride: 95 mmol/L — ABNORMAL LOW (ref 98–108)
Creatinine: 0.8 mg/dL (ref 0.60–1.20)
Glucose, Bld: 145 mg/dL — ABNORMAL HIGH (ref 73–118)
POTASSIUM: 4.4 mmol/L (ref 3.3–4.7)
Sodium: 137 mmol/L (ref 128–145)
TOTAL PROTEIN: 7.6 g/dL (ref 6.4–8.1)
Total Bilirubin: 5 mg/dL (ref 0.2–1.6)

## 2018-06-28 LAB — IRON AND TIBC
IRON: 21 ug/dL — AB (ref 42–163)
SATURATION RATIOS: 6 % — AB (ref 42–163)
TIBC: 355 ug/dL (ref 202–409)
UIBC: 334 ug/dL

## 2018-06-28 LAB — FERRITIN: FERRITIN: 261 ng/mL (ref 24–336)

## 2018-06-28 MED ORDER — SODIUM CHLORIDE 0.9% FLUSH
10.0000 mL | INTRAVENOUS | Status: DC | PRN
  Administered 2018-06-28: 10 mL
  Filled 2018-06-28: qty 10

## 2018-06-28 MED ORDER — SODIUM CHLORIDE 0.9 % IV SOLN: Freq: Once | INTRAVENOUS | Status: DC

## 2018-06-28 MED ORDER — SODIUM CHLORIDE 0.9 % IJ SOLN
10.0000 mL | Freq: Once | INTRAMUSCULAR | Status: AC
  Administered 2018-06-28: 10 mL
  Filled 2018-06-28: qty 10

## 2018-06-28 MED ORDER — PROMETHAZINE HCL 12.5 MG PO TABS: 12.5000 mg | ORAL_TABLET | Freq: Four times a day (QID) | ORAL | 1 refills | Status: AC | PRN

## 2018-06-28 MED ORDER — HEPARIN SOD (PORK) LOCK FLUSH 100 UNIT/ML IV SOLN
500.0000 [IU] | Freq: Once | INTRAVENOUS | Status: AC | PRN
  Administered 2018-06-28: 500 [IU]
  Filled 2018-06-28: qty 5

## 2018-06-28 MED ORDER — PROMETHAZINE HCL 25 MG/ML IJ SOLN
12.5000 mg | Freq: Once | INTRAMUSCULAR | Status: AC
  Administered 2018-06-28: 12.5 mg via INTRAVENOUS

## 2018-06-28 MED ORDER — FERUMOXYTOL INJECTION 510 MG/17 ML
510.0000 mg | Freq: Once | INTRAVENOUS | Status: AC
  Administered 2018-06-28: 510 mg via INTRAVENOUS
  Filled 2018-06-28: qty 17

## 2018-06-28 NOTE — Patient Instructions (Addendum)
Nausea and Vomiting, Adult Feeling sick to your stomach (nausea) means that your stomach is upset or you feel like you have to throw up (vomit). Feeling more and more sick to your stomach can lead to throwing up. Throwing up happens when food and liquid from your stomach are thrown up and out the mouth. Throwing up can make you feel weak and cause you to get dehydrated. Dehydration can make you tired and thirsty, make you have a dry mouth, and make it so you pee (urinate) less often. Older adults and people with other diseases or a weak defense system (immune system) are at higher risk for dehydration. If you feel sick to your stomach or if you throw up, it is important to follow instructions from your doctor about how to take care of yourself. Follow these instructions at home: Eating and drinking Follow these instructions as told by your doctor:  Take an oral rehydration solution (ORS). This is a drink that is sold at pharmacies and stores.  Drink clear fluids in small amounts as you are able, such as: ? Water. ? Ice chips. ? Diluted fruit juice. ? Low-calorie sports drinks.  Eat bland, easy-to-digest foods in small amounts as you are able, such as: ? Bananas. ? Applesauce. ? Rice. ? Low-fat (lean) meats. ? Toast. ? Crackers.  Avoid fluids that have a lot of sugar or caffeine in them.  Avoid alcohol.  Avoid spicy or fatty foods.  General instructions  Drink enough fluid to keep your pee (urine) clear or pale yellow.  Wash your hands often. If you cannot use soap and water, use hand sanitizer.  Make sure that all people in your home wash their hands well and often.  Take over-the-counter and prescription medicines only as told by your doctor.  Rest at home while you get better.  Watch your condition for any changes.  Breathe slowly and deeply when you feel sick to your stomach.  Keep all follow-up visits as told by your doctor. This is important. Contact a doctor  if:  You have a fever.  You cannot keep fluids down.  Your symptoms get worse.  You have new symptoms.  You feel sick to your stomach for more than two days.  You feel light-headed or dizzy.  You have a headache.  You have muscle cramps. Get help right away if:  You have pain in your chest, neck, arm, or jaw.  You feel very weak or you pass out (faint).  You throw up again and again.  You see blood in your throw-up.  Your throw-up looks like black coffee grounds.  You have bloody or black poop (stools) or poop that look like tar.  You have a very bad headache, a stiff neck, or both.  You have a rash.  You have very bad pain, cramping, or bloating in your belly (abdomen).  You have trouble breathing.  You are breathing very quickly.  Your heart is beating very quickly.  Your skin feels cold and clammy.  You feel confused.  You have pain when you pee.  You have signs of dehydration, such as: ? Dark pee, hardly any pee, or no pee. ? Cracked lips. ? Dry mouth. ? Sunken eyes. ? Sleepiness. ? Weakness. These symptoms may be an emergency. Do not wait to see if the symptoms will go away. Get medical help right away. Call your local emergency services (911 in the [place] Do not drive yourself to the hospital. This information is   not intended to replace advice given to you by your health care provider. Make sure you discuss any questions you have with your health care provider. Document Released: 05/19/2008 Document Revised: 06/20/2016 Document Reviewed: 08/07/2015 Elsevier Interactive Patient Education  2018 [employer].   Ferumoxytol injection Shirlean Kelly) What is this medicine? FERUMOXYTOL is an iron complex. Iron is used to make healthy red blood cells, which carry oxygen and nutrients throughout the body. This medicine is used to treat iron deficiency anemia in people with chronic kidney disease. This medicine may be used for other purposes; ask your health  care provider or pharmacist if you have questions. COMMON BRAND NAME(S): Feraheme What should I tell my health care provider before I take this medicine? They need to know if you have any of these conditions: -anemia not caused by low iron levels -high levels of iron in the blood -magnetic resonance imaging (MRI) test scheduled -an unusual or allergic reaction to iron, other medicines, foods, dyes, or preservatives -pregnant or trying to get pregnant -breast-feeding How should I use this medicine? This medicine is for injection into a vein. It is given by a health care professional in a hospital or clinic setting. Talk to your pediatrician regarding the use of this medicine in children. Special care may be needed. Overdosage: If you think you have taken too much of this medicine contact a poison control center or emergency room at once. NOTE: This medicine is only for you. Do not share this medicine with others. What if I miss a dose? It is important not to miss your dose. Call your doctor or health care professional if you are unable to keep an appointment. What may interact with this medicine? This medicine may interact with the following medications: -other iron products This list may not describe all possible interactions. Give your health care provider a list of all the medicines, herbs, non-prescription drugs, or dietary supplements you use. Also tell them if you smoke, drink alcohol, or use illegal drugs. Some items may interact with your medicine. What should I watch for while using this medicine? Visit your doctor or healthcare professional regularly. Tell your doctor or healthcare professional if your symptoms do not start to get better or if they get worse. You may need blood work done while you are taking this medicine. You may need to follow a special diet. Talk to your doctor. Foods that contain iron include: whole grains/cereals, dried fruits, beans, or peas, leafy green  vegetables, and organ meats (liver, kidney). What side effects may I notice from receiving this medicine? Side effects that you should report to your doctor or health care professional as soon as possible: -allergic reactions like skin rash, itching or hives, swelling of the face, lips, or tongue -breathing problems -changes in blood pressure -feeling faint or lightheaded, falls -fever or chills -flushing, sweating, or hot feelings -swelling of the ankles or feet Side effects that usually do not require medical attention (report to your doctor or health care professional if they continue or are bothersome): -diarrhea -headache -nausea, vomiting -stomach pain This list may not describe all possible side effects. Call your doctor for medical advice about side effects. You may report side effects to FDA at 1-800-FDA-1088. Where should I keep my medicine? This drug is given in a hospital or clinic and will not be stored at home. NOTE: This sheet is a summary. It may not cover all possible information. If you have questions about this medicine, talk to your doctor,  pharmacist, or health care provider.  2018 Elsevier/Gold Standard (2012019/09/21 12:41:49)

## 2018-06-28 NOTE — Progress Notes (Signed)
Hematology and Oncology Follow Up Visit  CLOYD RAGAS [id number] December 20, 1960 57 y.o. 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 Teale is here today with his father for follow-up and treatment. He is quite lethargic/fatigued and feeling nauseated despite having taken Zofran before leaving home this morning. He has not vomited.  He has not been eating well or staying hydrated. His weight is down another 11 lbs since we saw him last week.  He states that he is still having consistent abdominal pain. His biliary drain is intact with dark bile output.  CA 19-9 last week was up to 44,965.  Iron studies are quite low with iron saturation at 6%. He denies fever, chills, cough, rash, chest pain, palpitations, or changes in bowel or bladder habits.  No swelling, tenderness, numbness or tingling in her extremities at this time.  No lymphadenopathy noted on his exam.  He denies noting any episodes of bleeding, no bruising or petechiae.   ECOG Performance Status: 1 - Symptomatic but completely ambulatory  Medications:  Allergies as of 06/28/2018      Reactions   Cephalexin Other (See Comments)   "Sore throat" REACTION: SORE THROAT   Other    Bee sting   Latex Rash   Tape Rash      Medication List        Accurate as of 06/28/18 10:47 AM. Always use your most recent med list.          cetirizine 10 MG tablet Commonly known as:  ZYRTEC Take 10 mg by mouth daily.   dronabinol 5 MG capsule Commonly known as:  MARINOL Take 1 capsule (5 mg total) by mouth 2 (two) times daily before lunch and supper.   enoxaparin 100 MG/ML injection Commonly known as:  LOVENOX Inject 0.9 mLs (90 mg total) into the skin every 12 (twelve) hours.   esomeprazole 20 MG capsule Commonly known as:  NEXIUM TAKE  ONE CAPSULE BY MOUTH EVERY DAY   fentaNYL 25 MCG/HR patch Commonly known as:  [place] - dosed mcg/hr Place 1 patch (25 mcg total) onto the skin every 3 (three) days.   flintstones complete 60 MG chewable tablet Chew 1 tablet by mouth daily.   fluconazole 200 MG tablet Commonly known as:  DIFLUCAN Take 1 tablet (200 mg total) by mouth daily.   hydrocortisone cream 1 % Apply 1 application topically daily as needed for itching.   LORazepam 1 MG tablet Commonly known as:  ATIVAN Take 1 tablet (1 mg total) by mouth every 6 (six) hours as needed for anxiety.   ondansetron 4 MG disintegrating tablet Commonly known as:  ZOFRAN-ODT Take 4 mg by mouth.   oxyCODONE 5 MG immediate release tablet Commonly known as:  Oxy IR/ROXICODONE Take 1 tablet (5 mg total) by mouth every 8 (eight) hours as needed for severe pain or breakthrough pain.   polyethylene glycol packet Commonly known as:  MIRALAX / GLYCOLAX Take 17 g by mouth daily.   prochlorperazine 10 MG tablet Commonly known as:  COMPAZINE Take 1 tablet (10 mg total) by mouth every 6 (six) hours as needed for nausea or vomiting.   VITAMIN B 12 PO Take 1,000 mcg by mouth daily.       Allergies:  Allergies  Allergen Reactions  . Cephalexin Other (See Comments)    "  Sore throat" REACTION: SORE THROAT  . Other     Bee sting  . Latex Rash  . Tape Rash    Past Medical History, Surgical history, Social history, and Family History were reviewed and updated.  Review of Systems: All other 10 point review of systems is negative.   Physical Exam:  weight is 166 lb 6 oz (75.5 kg). His oral temperature is 98 F (36.7 C). His blood pressure is 116/76 and his pulse is 84. His respiration is 18 and oxygen saturation is 100%.   Wt Readings from Last 3 Encounters:  06/28/18 166 lb 6 oz (75.5 kg)  06/21/18 177 lb 1.3 oz (80.3 kg)  06/16/18 190 lb 0.6 oz (86.2 kg)    Ocular: Sclerae unicteric, pupils equal, round and reactive to  light Ear-nose-throat: Oropharynx clear, dentition fair Lymphatic: No cervical, supraclavicular or axillary adenopathy Lungs no rales or rhonchi, good excursion bilaterally Heart regular rate and rhythm, no murmur appreciated Abd soft, generalized tenderness, biliary drain intact, positive bowel sounds, no liver or spleen tip palpated on exam, no fluid wave  MSK no focal spinal tenderness, no joint edema Neuro: non-focal, well-oriented, appropriate affect Breasts: Deferred   Lab Results  Component Value Date   WBC 9.9 06/28/2018   HGB 9.0 (L) 06/28/2018   HCT 29.2 (L) 06/28/2018   MCV 90.7 06/28/2018   PLT 536 (H) 06/28/2018   Lab Results  Component Value Date   FERRITIN 436 (H) 06/21/2018   IRON 30 (L) 06/21/2018   TIBC 329 06/21/2018   UIBC 299 06/21/2018   IRONPCTSAT 9 (L) 06/21/2018   Lab Results  Component Value Date   RBC 3.22 (L) 06/28/2018   No results found for: KPAFRELGTCHN, LAMBDASER, KAPLAMBRATIO No results found for: IGGSERUM, IGA, IGMSERUM No results found for: TOTALPROTELP, ALBUMINELP, A1GS, A2GS, BETS, Carloyn Jaeger, MSPIKE, SPEI   Chemistry      Component Value Date/Time   NA 137 06/28/2018 0848   K 4.4 06/28/2018 0848   CL 95 (L) 06/28/2018 0848   CO2 31 06/28/2018 0848   BUN 20 06/28/2018 0848   CREATININE 0.80 06/28/2018 0848      Component Value Date/Time   CALCIUM 9.4 06/28/2018 0848   ALKPHOS 641 (H) 06/28/2018 0848   AST 138 (H) 06/28/2018 0848   ALT 126 (H) 06/28/2018 0848   BILITOT 5.0 ([place]) 06/28/2018 0848      Impression and Plan: Mr. Mcfarren is a pleasant 57 yo caucasian gentleman with metastatic gastric cancer and a CA 19-9 > 44,000. He is currently not eating or hydrating well and has had weight loss of 11 lbs since last week. He is feeling nauseated, no vomiting.  We gave him 12.5 mg IV today and will sent him home on PO.  Dr. Marin Olp was also able to go in a an speak with the patient. We will hold treatment today and give him a 2  weeks break.  His iron saturation is low at 6% so we will give him IV iron today and again next week.  We will plan to see him back in 2 weeks.  They will contact our office with any questions or concerns. We can certainly see him sooner if need be.    Laverna Peace, NP 7/15/201910:47 AM   Addendum:   I had a long talk with Mr. Laduca and his father.  I explained to him that I just did not think that he was going to be able to take any additional  chemotherapy.  His performance status is not that good (ECOG  3).  He just is losing weight.  He is lost 11 pounds in 1 week.  He is not eating.  I know that he is trying his best.  I does think that this cancer is just beginning to overwhelm him.  I want to give him respect, dignity and comfort.  I told him that I thought one possibility would be hospice.  I think hospice would be a very good idea for he and his family.  Hospice can come in.  Try to keep him at home.  They can help provide medications and equipment.  I have not yet talked to him about end-of-life issues.  I have not talked to him yet about what he desires for the event that he passes and whether or not he wants to be kept alive on machines.  I will have to talk to him about this the next time we see him.  I told him that I would give him 2 weeks.  If his weight goes up in 2 weeks, then we will consider treatment on him.  However, if his weight continues to go down, then we will not offer any further chemotherapy and just focus on comfort and quality of life.  I would be surprised if he made it through August.  For that matter, if his decline continues as he is going, he may not even make it to August.  I spent about an hour with he and his dad.  I spent all the time face-to-face with them.  The whole time was spent talking to them about his current status and what are options are.  Lattie Haw, MD

## 2018-06-28 NOTE — Telephone Encounter (Signed)
Critical Value Total Bilirubin 5.0 Laverna Peace NP aware. No orders at this time.

## 2018-06-28 NOTE — Patient Instructions (Signed)
Implanted Port Home Guide An implanted port is a type of central line that is placed under the skin. Central lines are used to provide IV access when treatment or nutrition needs to be given through a person's veins. Implanted ports are used for long-term IV access. An implanted port may be placed because:  You need IV medicine that would be irritating to the small veins in your hands or arms.  You need long-term IV medicines, such as antibiotics.  You need IV nutrition for a long period.  You need frequent blood draws for lab tests.  You need dialysis.  Implanted ports are usually placed in the chest area, but they can also be placed in the upper arm, the abdomen, or the leg. An implanted port has two main parts:  Reservoir. The reservoir is round and will appear as a small, raised area under your skin. The reservoir is the part where a needle is inserted to give medicines or draw blood.  Catheter. The catheter is a thin, flexible tube that extends from the reservoir. The catheter is placed into a large vein. Medicine that is inserted into the reservoir goes into the catheter and then into the vein.  How will I care for my incision site? Do not get the incision site wet. Bathe or shower as directed by your health care provider. How is my port accessed? Special steps must be taken to access the port:  Before the port is accessed, a numbing cream can be placed on the skin. This helps numb the skin over the port site.  Your health care provider uses a sterile technique to access the port. ? Your health care provider must put on a mask and sterile gloves. ? The skin over your port is cleaned carefully with an antiseptic and allowed to dry. ? The port is gently pinched between sterile gloves, and a needle is inserted into the port.  Only "non-coring" port needles should be used to access the port. Once the port is accessed, a blood return should be checked. This helps ensure that the port  is in the vein and is not clogged.  If your port needs to remain accessed for a constant infusion, a clear (transparent) bandage will be placed over the needle site. The bandage and needle will need to be changed every week, or as directed by your health care provider.  Keep the bandage covering the needle clean and dry. Do not get it wet. Follow your health care provider's instructions on how to take a shower or bath while the port is accessed.  If your port does not need to stay accessed, no bandage is needed over the port.  What is flushing? Flushing helps keep the port from getting clogged. Follow your health care provider's instructions on how and when to flush the port. Ports are usually flushed with saline solution or a medicine called heparin. The need for flushing will depend on how the port is used.  If the port is used for intermittent medicines or blood draws, the port will need to be flushed: ? After medicines have been given. ? After blood has been drawn. ? As part of routine maintenance.  If a constant infusion is running, the port may not need to be flushed.  How long will my port stay implanted? The port can stay in for as long as your health care provider thinks it is needed. When it is time for the port to come out, surgery will be   done to remove it. The procedure is similar to the one performed when the port was put in. When should I seek immediate medical care? When you have an implanted port, you should seek immediate medical care if:  You notice a bad smell coming from the incision site.  You have swelling, redness, or drainage at the incision site.  You have more swelling or pain at the port site or the surrounding area.  You have a fever that is not controlled with medicine.  This information is not intended to replace advice given to you by your health care provider. Make sure you discuss any questions you have with your health care provider. Document  Released: 12/01/2005 Document Revised: 05/08/2016 Document Reviewed: 08/08/2013 Elsevier Interactive Patient Education  2017 [employer]  

## 2018-06-29 LAB — CANCER ANTIGEN 19-9: CAN 19-9: 45373 U/mL — AB (ref 0–35)

## 2018-06-30 ENCOUNTER — Telehealth: Payer: Self-pay

## 2018-06-30 ENCOUNTER — Telehealth: Payer: Self-pay | Admitting: Family Medicine

## 2018-06-30 ENCOUNTER — Inpatient Hospital Stay: Payer: BLUE CROSS/BLUE SHIELD

## 2018-06-30 NOTE — Telephone Encounter (Signed)
Patient and father present to our office today d/t pump stop appt that was canceled as patient did not go home with ambulatory chemo pump. Sherene Sires, pt's father requests to speak with nurse. Charlsie Merles RN adequately and appropriated answered questions related to future appts and states that Dr Marin Olp indicated at appt earlier in the week "there may not be much else we can do for [name]." Jori Moll questions if a Hospice consult could be performed to evaluate patient and offer eligible care. Dr Marin Olp agrees with this plan. New patient referral call placed to Amber with [facility].

## 2018-06-30 NOTE — Telephone Encounter (Signed)
Copied from [place] [id number]. Topic: Quick Communication - See Telephone Encounter >> Jun 30, 2018 10:24 AM Synthia Innocent wrote: CRM for notification. See Telephone encounter for: 06/30/18. [place] calling back with requesting to resume home health orders, please advise

## 2018-06-30 NOTE — Telephone Encounter (Signed)
Left message for them to call back We did not order home health looks like oncology did.

## 2018-07-02 ENCOUNTER — Encounter (HOSPITAL_COMMUNITY): Payer: Self-pay

## 2018-07-02 ENCOUNTER — Ambulatory Visit (HOSPITAL_COMMUNITY)
Admit: 2018-07-02 | Discharge: 2018-07-02 | Disposition: A | Discharge status: Home / Self Care | Payer: BLUE CROSS/BLUE SHIELD | Attending: Radiology | Admitting: Radiology

## 2018-07-02 DIAGNOSIS — C801 Malignant (primary) neoplasm, unspecified: Secondary | ICD-10-CM

## 2018-07-02 DIAGNOSIS — K831 Obstruction of bile duct: Principal | ICD-10-CM

## 2018-07-06 ENCOUNTER — Other Ambulatory Visit: Payer: Self-pay | Admitting: *Deleted

## 2018-07-06 ENCOUNTER — Telehealth: Payer: Self-pay | Admitting: *Deleted

## 2018-07-06 DIAGNOSIS — C259 Malignant neoplasm of pancreas, unspecified: Secondary | ICD-10-CM

## 2018-07-06 DIAGNOSIS — C799 Secondary malignant neoplasm of unspecified site: Principal | ICD-10-CM

## 2018-07-06 NOTE — Telephone Encounter (Signed)
Patient's father calling stating that at this time, they would like to hold on Hospice, but enroll in the Palliative Care Program instead. Order placed for Palliative Care. Father aware.   Father also mentions some soreness to the abdomen from where he is getting his lovenox. Recommended using ice for pain control. They will try this.

## 2018-07-07 ENCOUNTER — Telehealth: Payer: Self-pay

## 2018-07-07 NOTE — Telephone Encounter (Signed)
Phone call placed to patient. Visit scheduled with Palliative Care for 07/09/18

## 2018-07-09 ENCOUNTER — Other Ambulatory Visit (HOSPITAL_COMMUNITY): Payer: 59

## 2018-07-09 ENCOUNTER — Other Ambulatory Visit: Payer: Self-pay | Admitting: *Deleted

## 2018-07-09 ENCOUNTER — Other Ambulatory Visit: Payer: BLUE CROSS/BLUE SHIELD | Admitting: Nurse Practitioner

## 2018-07-09 ENCOUNTER — Telehealth: Payer: Self-pay | Admitting: *Deleted

## 2018-07-09 DIAGNOSIS — C4372 Malignant melanoma of left lower limb, including hip: Secondary | ICD-10-CM

## 2018-07-09 NOTE — Telephone Encounter (Signed)
Stephanie Martinique, NP with Palliative Care, has spoken with patient and family and they are ready for hospice referral. Order for hospice given.   The family also has concern about how they will transport patient to appointment on Monday. He is too weak to get out of bed. Will cancel appointment.  Lastly, patient is c/o discomfort related to lovenox injections. Dr Marin Olp is okay to discontinue these.  Patient's family will reach out as needed.

## 2018-07-12 ENCOUNTER — Inpatient Hospital Stay: Payer: BLUE CROSS/BLUE SHIELD | Admitting: Hematology & Oncology

## 2018-07-12 ENCOUNTER — Telehealth: Payer: Self-pay | Admitting: *Deleted

## 2018-07-12 ENCOUNTER — Inpatient Hospital Stay: Payer: BLUE CROSS/BLUE SHIELD

## 2018-07-12 NOTE — Telephone Encounter (Signed)
Home health orders received 07/12/18 for [facility] initiation orders: 06/17/18-07/02/18.  Home health re-certification orders: no. Patient last seen by ordering physician for this condition: 06/07/18. Must be less than 90 days for re-certification and less than 30 days prior for initiation. Visit must have been for the condition the orders are being placed.  Patient meets criteria for Physician to sign orders: yes       Current med list has been attached: yes        Orders placed on physicians desk for signature: 07/12/18 (date) If patient does not meet criteria for orders to be signed: pt was called to schedule appt. Appt is scheduled for    [name]

## 2018-07-12 NOTE — Telephone Encounter (Signed)
I signed these orders. However, he ha snot been seen or managed by this provider for those conditions since prior to his most recent hospitalization.  Furthermore, I did not give a verbal order for this order as it states on July 4. Our office was closed and I was on vacation. Please make the [facility] agency aware of the above.

## 2018-07-13 ENCOUNTER — Other Ambulatory Visit: Payer: Self-pay | Admitting: *Deleted

## 2018-07-13 MED ORDER — MORPHINE SULFATE (CONCENTRATE) 20 MG/ML PO SOLN: 5.0000 mg | ORAL | 0 refills | Status: AC | PRN

## 2018-07-13 NOTE — Telephone Encounter (Signed)
Home health orders faxed to [facility]

## 2018-07-14 ENCOUNTER — Inpatient Hospital Stay: Payer: BLUE CROSS/BLUE SHIELD

## 2018-07-14 ENCOUNTER — Telehealth: Payer: Self-pay | Admitting: *Deleted

## 2018-07-14 NOTE — Telephone Encounter (Signed)
Received notification from hospice RN that patient passed away this am at home at 10:53 am.  Dr Marin Olp notified.

## 2018-07-15 NOTE — Progress Notes (Signed)
IP PROGRESS NOTE  Subjective:   [name] completed a first cycle of FOLFOX on 06/02/2018 for treatment of metastatic gastric cancer.  He was admitted 06/09/2018 with extensive pulmonary emboli.  He was placed on heparin anticoagulation. On 06/11/2018 he developed increased dyspnea.  A repeat chest CT 06/11/2018 revealed new clot in the distal left pulmonary artery and increased clot burden in the left sided pulmonary arteries. An IVC filter was placed on 06/12/2018.  He continues heparin anticoagulation.  [name] reports persistent dyspnea.  Objective: Vital signs in last 24 hours: Blood pressure 114/83, pulse 68, temperature 97.9 F (36.6 C), temperature source Oral, resp. rate 14, height 5\' 11"  (1.803 m), weight 203 lb 4.2 oz (92.2 kg), SpO2 96 %.  Intake/Output from previous day: 06/30 0701 - 07/01 0700 In: 943.5 [P.O.:600; I.V.:333.5] Out: 800 [Urine:350; Drains:450]  Physical Exam:  HEENT: scleral icterus Lungs: inspiratory rales at posterior base bilaterally Cardiac: RRR Abdomen: tender Left upper abdomen, no hepatomegaly, RUQ biliary drain Extremities: no leg edema   Portacath/PICC-without erythema  Lab Results: Recent Labs    06/12/18 0315 06/13/18 0341  WBC 9.8 10.5  HGB 9.5* 9.4*  HCT 29.2* 29.4*  PLT 499* 522*    BMET Recent Labs    06/12/18 0315 06/13/18 0341  NA 140 138  K 4.3 4.5  CL 105 102  CO2 24 28  GLUCOSE 105* 106*  BUN 10 11  CREATININE 0.73 0.89  CALCIUM 8.5* 8.6*    Lab Results  Component Value Date   CEA1 47.72 (H) 06/08/2018    Studies/Results: No results found.  Medications: I have reviewed the patient's current medications.  Assessment/Plan:  1.  Metastatic gastric cancer with abdominal carcinomatosis  Cycle 1 FOLFOX 06/02/2018  2.  Bilateral pulmonary emboli 06/09/2018, maintained on IV heparin  IVC filter 06/12/2018 after CT chest 06/11/2018 confirmed an increase in the chest but burden  Lower extremity Dopplers  06/10/2018-bilateral lower extremity deep vein thrombosis  3.  Biliary obstruction secondary to #1, status post placement of an internal-external biliary drain 05/28/2018  4.  Pain secondary to #1   [name] has metastatic gastric cancer.  He is completed 1 cycle of systemic therapy with FOLFOX.  He is now admitted with pulmonary emboli.  He has been maintained on heparin anticoagulation since hospital admission.  An IVC filter is in place.  Recommendations: 1.  Outpatient follow-up will be scheduled at the [facility] oncology clinic for cycle 2 FOLFOX 2.  Increase ambulation and wean oxygen as tolerated 3.  Convert to therapeutic twice daily Lovenox dosing at discharge  LOS: 5 days   Betsy Coder, MD   Jun 17, 2018, 7:37 AM

## 2018-07-15 NOTE — Progress Notes (Signed)
Received patient as transfer from ICU.  Patient stable, oriented to unit and equipment, place on telemetry and verified with CMT.  Agree with previous RN's assessment of patient.  Will continue to monitor.

## 2018-07-15 NOTE — Progress Notes (Signed)
PROGRESS NOTE    [name]  JOA:[id number] DOB: 31-Aug-1961 DOA: 06/09/2018 PCP: Ma Hillock, DO   Brief Narrative:  57 y.o. male with medical history significant of adenocarcinoma-gastric, transaminitis/ elevated total bilirubin status post drain placement 6/14, GERD, chronic constipation recently diagnosed of gastric cancer comes back to the hospital with complains of shortness of breath.  Patient was recently here and discharged on June 04, 2018 after being treated for elevated LFTs with percutaneous biliary drain, status post endoscopy with gastric biopsy suggestive of adenocarcinoma and started on FOLFOX on June 19.  He returns to the hospital with complains of shortness of breath and hypoxia.  He was found to have submassive pulmonary embolism with right-sided heart strain therefore admitted on heparin drip.  Patient had worsening of shortness of breath on 6/28 with concern of progression of his clot therefore CT of the chest was done which confirmed progression of his pulmonary embolism with increasing right heart strain.  Interventional radiology was consulted and IVC filter was placed.  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  We will continue this and try and wean him off as much as possible. - Initial CT a of the chest showed pulmonary embolism with right heart strain but had a follow-up CAT scan due to worsening of breathing symptoms on 6/28 which showed progression of his pulmonary embolism with increasing right heart strain -Continue heparin drip and we will switch him over to Lovenox once okay with  oncology.  Status post IVC filter placement on 6/28 - Echocardiogram done on 6/27 showed ejection fraction 60-65% with grade 1 diastolic dysfunction. -Lower extremity Doppler is positive for bilateral DVT -Pain control  Gastro adenocarcinoma Transaminitis with mild ductal dilation/elevated total bilirubin -Biliary drain is in place since 6/14.  Continue to trend LFTs.  History of GERD -Continue PPI  DVT prophylaxis: On Hep Drip  Code Status: Full  Family Communication: None at bedside  Disposition Plan:  TBD Consults called: Oncology, IR Admission status:  If remains stable we will transfer him to telemetry.  Consultants:   Oncology   Interventional radiology  Procedures:   IVC filter placement 6/28  Antimicrobials:   None   Subjective: Patient continues to ask if he can move around and walk in the hallway.  I explained to him that he is at a very high risk for dislodging his clot and it can migrate making his symptoms worse.  No other acute events overnight otherwise.  Review of Systems Otherwise negative except as per HPI, including: General = no fevers, chills, dizziness, malaise, fatigue HEENT/EYES = negative for pain, redness, loss of vision, double vision, blurred vision, loss of hearing, sore throat, hoarseness, dysphagia Cardiovascular= negative for chest pain, palpitation, murmurs, lower extremity swelling Respiratory/lungs= negative for shortness of breath, cough, hemoptysis, wheezing, mucus production Gastrointestinal= negative for nausea, vomiting,, abdominal pain, melena, hematemesis Genitourinary= negative for Dysuria, Hematuria, Change in Urinary Frequency MSK = Negative for arthralgia, myalgias, Back Pain, Joint swelling  Neurology= Negative for headache, seizures, numbness, tingling  Psychiatry= Negative for anxiety, depression, suicidal and homocidal ideation Allergy/Immunology= Medication/Food allergy as listed  Skin= Negative for Rash,  lesions,  ulcers, itching    Objective: Vitals:   June 18, 2018 0500 06-18-2018 0600 18-Jun-2018 0800 06/18/2018 1010  BP:    119/72  Pulse: 71 68  84  Resp: 14 14  (!) 27  Temp:   97.8 F (36.6 C)   TempSrc:   Oral   SpO2: 97% 96%  98%  Weight: 92.2 kg (203 lb 4.2 oz)     Height:        Intake/Output Summary (Last 24 hours) at Jun 18, 2018 1046 Last data filed at 06-18-2018 0728 Gross per 24 hour  Intake 530 ml  Output 700 ml  Net -170 ml   Filed Weights   06/11/18 0510 06/13/18 0500 06/18/18 0500  Weight: 87.6 kg (193 lb 1.6 oz) 92 kg (202 lb 13.2 oz) 92.2 kg (203 lb 4.2 oz)    Examination: Constitutional: NAD, calm, comfortable; remains on 3L [place] Eyes: PERRL, lids and conjunctivae normal ENMT: Mucous membranes are moist. Posterior pharynx clear of any exudate or lesions.Normal dentition.  Neck: normal, supple, no masses, no thyromegaly Respiratory: clear to auscultation bilaterally, no wheezing, no crackles. Normal respiratory effort. No accessory muscle use.  Chemo port in place.  Cardiovascular: Regular rate and rhythm, no murmurs / rubs / gallops. No extremity edema. 2+ pedal pulses. No carotid bruits.  Abdomen: no tenderness, no masses palpated. No hepatosplenomegaly. Bowel sounds positive.  Musculoskeletal: no clubbing / cyanosis. No joint deformity upper and lower extremities. Good ROM, no contractures. Normal muscle tone.  Skin: no rashes, lesions, ulcers. No induration Neurologic: CN 2-12 grossly intact. Sensation intact, DTR normal. Strength 5/5 in all 4.  Psychiatric: Normal judgment and insight. Alert and oriented x 3. Normal mood.   Data Reviewed:   CBC: Recent Labs  Lab 06/07/18 1428 06/08/18 1504 06/09/18 1254 06/10/18 0454 06/11/18 0447 06/12/18 0315 06/13/18 0341 06/18/18 0758  WBC 11.7* 12.4* 14.3* 14.0* 10.8* 9.8 10.5 9.7  NEUTROABS 9.4* 10.3* 11.9*  --   --   --   --   --   HGB 11.3* 11.1* 11.1* 9.7* 9.2* 9.5* 9.4* 10.1*  HCT 33.5* 33.8* 34.1* 30.4* 29.2* 29.2*  29.4* 31.6*  MCV 92.4 93.1 94.2 94.7 94.8 93.6 93.6 93.8  PLT 473.0* 479* 530* 439* 473* 499* 522* 409*   Basic Metabolic Panel: Recent Labs  Lab 06/10/18 0454 06/11/18 0447 06/12/18 0315 06/13/18 0341 2018/06/18 0758  NA 140 136 140 138 135  K 4.2 3.6 4.3 4.5 3.9  CL 101 100 105 102 96*  CO2 31 26 24 28 27   GLUCOSE 112* 108* 105* 106* 109*  BUN 15 11 10 11 8   CREATININE 0.87 0.72 0.73 0.89 0.77  CALCIUM 8.8* 8.3* 8.5* 8.6* 8.6*  MG 2.1 2.0 2.1 2.1 2.1   GFR: Estimated Creatinine Clearance: 122.6 mL/min (by C-G formula based on SCr of 0.77 mg/dL). Liver Function Tests: Recent Labs  Lab 06/09/18 1254 06/11/18 0447 06/12/18 0315 06/13/18 0341 06/18/2018 0758  AST 144* 115* 114* 107* 122*  ALT 153* 112* 113* 102* 106*  ALKPHOS 522* 359* 360* 383* 437*  BILITOT 8.2* 6.3* 6.6* 6.3* 6.1*  PROT 8.2* 6.2* 6.5 6.6 7.0  ALBUMIN 3.2* 2.5* 2.5* 2.6* 2.7*   Recent Labs  Lab 06/09/18 1254  LIPASE 57*   No results for input(s): AMMONIA in the last 168 hours. Coagulation Profile: Recent Labs  Lab 06/09/18 1254  INR 1.40   Cardiac Enzymes: No results for input(s): CKTOTAL, CKMB, CKMBINDEX, TROPONINI in the last 168 hours. BNP (last 3  results) No results for input(s): PROBNP in the last 8760 hours. HbA1C: No results for input(s): HGBA1C in the last 72 hours. CBG: No results for input(s): GLUCAP in the last 168 hours. Lipid Profile: No results for input(s): CHOL, HDL, LDLCALC, TRIG, CHOLHDL, LDLDIRECT in the last 72 hours. Thyroid Function Tests: No results for input(s): TSH, T4TOTAL, FREET4, T3FREE, THYROIDAB in the last 72 hours. Anemia Panel: No results for input(s): VITAMINB12, FOLATE, FERRITIN, TIBC, IRON, RETICCTPCT in the last 72 hours. Sepsis Labs: Recent Labs  Lab 06/09/18 1316  LATICACIDVEN 1.74    Recent Results (from the past 240 hour(s))  Culture, blood (routine x 2)     Status: None (Preliminary result)   Collection Time: 06/09/18  1:03 PM  Result  Value Ref Range Status   Specimen Description   Final    BLOOD LEFT ANTECUBITAL Performed at [facility] [address] [place], [place] [address]    Special Requests   Final    BOTTLES DRAWN AEROBIC AND ANAEROBIC Blood Culture adequate volume Performed at [facility] [address], [place], [place] [address]    Culture   Final    NO GROWTH 4 DAYS Performed at [facility], [place] [address], [place], [place] [address]    Report Status PENDING  Incomplete  Culture, blood (routine x 2)     Status: None (Preliminary result)   Collection Time: 06/09/18  1:03 PM  Result Value Ref Range Status   Specimen Description   Final    BLOOD PORTA CATH Performed at [facility] [address], [place], [place] [address]    Special Requests   Final    BOTTLES DRAWN AEROBIC ONLY Blood Culture adequate volume Performed at [facility] [address] [place], [place] [address]    Culture   Final    NO GROWTH 4 DAYS Performed at [facility], [place] [address], [place], [place] [address]    Report Status PENDING  Incomplete  MRSA PCR Screening     Status: None   Collection Time: 06/11/18  6:50 PM  Result Value Ref Range Status   MRSA by PCR NEGATIVE NEGATIVE Final    Comment:        The GeneXpert MRSA Assay (FDA approved for NASAL specimens only), is one component of a comprehensive MRSA colonization surveillance program. It is not intended to diagnose MRSA infection nor to guide or monitor treatment for MRSA infections. Performed at [facility], [place] [address], [place], [place] [address]          Radiology Studies: No results found.      Scheduled Meds: . Chlorhexidine Gluconate Cloth  6 each Topical Daily  . dronabinol  5 mg Oral BID AC  . feeding supplement  1 Container Oral BID BM  . fentaNYL  25 mcg Transdermal Q72H  . fluconazole  200 mg Oral Daily  .  loratadine  10 mg Oral Daily  . mouth rinse  15 mL Mouth Rinse BID  . multivitamin with minerals  1 tablet Oral Daily  . pantoprazole  40 mg Oral Daily  . polyethylene glycol  17 g Oral Daily  . protein supplement shake  11 oz Oral Q24H  . sodium chloride flush  10-40 mL Intracatheter Q12H  . vitamin B-12  1,000 mcg Oral Daily   Continuous Infusions: . heparin 1,450 Units/hr (07-02-2018 0600)     LOS: 5 days  I have spent 22 minutes face to face with the patient and on the ward discussing the patients care, assessment, plan and disposition with other care givers. >50% of the time was devoted counseling the patient about the risks and benefits of treatment and coordinating care.     Ankit Arsenio Loader, MD [facility] Pager [phone]   If 7PM-7AM, please contact night-coverage [email or web address] Password [email or web address] 07/01/18, 10:46 AM

## 2018-07-15 NOTE — Progress Notes (Signed)
Referring Physician(s): [place]  Supervising Physician: Aletta Edouard  Patient Status:  [facility] - In-pt  Chief Complaint: Shortness of breath    Subjective: Patient status post internal/external biliary drain placement 05/28/18, right chest wall Port-A-Cath placement on 05/31/18, and exchange of biliary drain on 06/03/18. Patient has history of gastric adenocarcinoma, GERD, chronic constipation. Patient was admitted to hospital with shortness of breath and hypoxia. He was found to have a submassive pulmonary embolism with right-sided heart strain. He was placed on heparin drip. He continued to have shortness of breath and CT of chest was performed on 6/28 which showed increased pulmonary emboli the in the left pulmonary arteries and unchanged large clot burden in right pulmonary arteries with slightly increased right heart strain. IVC filter was placed 06/11/18. He is currently stable.  Past Medical History:  Diagnosis Date  . Allergy   . Arthritis   . Asplenia   . Basal cell carcinoma   . Erectile dysfunction   . Facial fracture ([place])   . GERD (gastroesophageal reflux disease)   . History of chicken pox   . Melanoma of thigh ([place])    left  . Metastasis from gastric cancer ([place]) 06/01/2018  . Squamous cell carcinoma, arm    left   Past Surgical History:  Procedure Laterality Date  . BIOPSY  05/28/2018   Procedure: BIOPSY;  Surgeon: Milus Banister, MD;  Location: [facility];  Service: Endoscopy;;  . CHOLECYSTECTOMY  2009  . ESOPHAGOGASTRODUODENOSCOPY (EGD) WITH PROPOFOL Left 05/28/2018   Procedure: ESOPHAGOGASTRODUODENOSCOPY (EGD) WITH PROPOFOL;  Surgeon: Milus Banister, MD;  Location: [facility];  Service: Endoscopy;  Laterality: Left;  . IR CHOLANGIOGRAM EXISTING TUBE  06/01/2018  . IR EXCHANGE BILIARY DRAIN  06/03/2018  . IR IMAGING GUIDED PORT INSERTION  05/31/2018  . IR INT EXT BILIARY DRAIN WITH CHOLANGIOGRAM  05/28/2018  . IR IVC FILTER PLMT / S&I /IMG GUID/MOD SED   06/11/2018  . MANDIBLE SURGERY    . MELANOMA EXCISION WITH SENTINEL LYMPH NODE BIOPSY  1999  . NASAL SINUS SURGERY    . ORIF ORBITAL FRACTURE    . PALATE SURGERY    . right arm  09/14/08   x2 plates/screws  . SPLENECTOMY, TOTAL  2008   trauma  . zygomtic Left 2015   fracture/plate-screw    Allergies: Cephalexin; Other; Latex; and Tape  Medications: Prior to Admission medications   Medication Sig Start Date End Date Taking? Authorizing Provider  cetirizine (ZYRTEC) 10 MG tablet Take 10 mg by mouth daily.     Yes [provider]  Cyanocobalamin (VITAMIN B 12 PO) Take 1,000 mcg by mouth daily.   Yes [provider]  esomeprazole (NEXIUM) 20 MG capsule TAKE ONE CAPSULE BY MOUTH EVERY DAY 04/10/15  Yes Dorena Cookey, MD  fentaNYL (DURAGESIC - DOSED MCG/HR) 25 MCG/HR patch Place 1 patch (25 mcg total) onto the skin every 3 (three) days. 06/06/18  Yes Georgette Shell, MD  flintstones complete (FLINTSTONES) 60 MG chewable tablet Chew 1 tablet by mouth daily.   Yes [provider]  fluconazole (DIFLUCAN) 200 MG tablet Take 1 tablet (200 mg total) by mouth daily. 06/04/18  Yes Georgette Shell, MD  hydrocortisone cream 1 % Apply 1 application topically daily as needed for itching.   Yes [provider]  oxyCODONE (OXY IR/ROXICODONE) 5 MG immediate release tablet Take 1 tablet (5 mg total) by mouth every 8 (eight) hours as needed for severe pain or breakthrough  pain. 06/07/18  Yes Kuneff, Renee A, DO  polyethylene glycol (MIRALAX / GLYCOLAX) packet Take 17 g by mouth daily.   Yes [provider]  prochlorperazine (COMPAZINE) 10 MG tablet Take 1 tablet (10 mg total) by mouth every 6 (six) hours as needed for nausea or vomiting. 06/07/18  Yes Kuneff, Renee A, DO  sildenafil (REVATIO) 20 MG tablet 1-5 tabs PO prior to sexually activity. 04/20/18  Yes Kuneff, Renee A, DO  dronabinol (MARINOL) 5 MG capsule Take 1 capsule (5 mg total) by mouth 2 (two)  times daily before lunch and supper. Patient not taking: Reported on 06/09/2018 06/08/18   Volanda Napoleon, MD     Vital Signs: BP 119/72   Pulse 84   Temp 97.6 F (36.4 C) (Oral)   Resp (!) 27   Ht '5\' 11"'$  (1.803 m)   Wt 203 lb 4.2 oz (92.2 kg)   SpO2 98%   BMI 28.35 kg/m   Physical Exam: Sitting up in chair, in no acute distress. Lungs clear to auscultation on 3L nasal cannula. Normal heart rate and rhythm. No lower extremity edema. Abdomen is mildly tender at site of biliary drain, non distended, normal bowel sounds.   Imaging: Ct Angio Chest Pe W Or Wo Contrast  Result Date: 06/11/2018 CLINICAL DATA:  57 year old with metastatic gastric cancer and pulmonary emboli. Worsening shortness of breath and weakness. EXAM: CT ANGIOGRAPHY CHEST WITH CONTRAST TECHNIQUE: Multidetector CT imaging of the chest was performed using the standard protocol during bolus administration of intravenous contrast. Multiplanar CT image reconstructions and MIPs were obtained to evaluate the vascular anatomy. CONTRAST:  136m ISOVUE-370 IOPAMIDOL (ISOVUE-370) INJECTION 76% COMPARISON:  Chest CTA 06/09/2018. FINDINGS: Cardiovascular: There is new clot in the distal left pulmonary artery. There is increased clot burden in the main left lower lobe pulmonary artery and left lower lobe segmental branches. There is increased clot involving the left upper lobe segmental branches. The large clot burden in the right pulmonary arteries has not significantly changed. There continues to be clot in the distal main right pulmonary artery and extending into the right upper and lower pulmonary arteries. Right ventricle continues to be enlarged and there continues to be evidence for right heart strain. The RV: LV ratio has slightly increased, now measuring close to 1.4. No significant pericardial fluid. Mediastinum/Nodes: Visualized thyroid tissue is unremarkable. Right jugular Port-A-Cath is present. The tip is probably near the  cavoatrial junction but difficult to characterize due to motion artifact. Again noted is large amount of fluid in the distal paraesophageal region which is extending up from the abdomen. No significant chest lymphadenopathy. Lungs/Pleura: The trachea and mainstem bronchi are patent. Again noted is atelectasis or scarring in the lingula. There is increased atelectasis or trace pleural fluid at the left lung base. No large areas of lung consolidation or airspace disease. Upper Abdomen: Patient has an internal/external biliary drain from a right hepatic approach. There is a small amount of perihepatic ascites which is slightly hyperdense. Again noted is a large low-density structure along the posterior right hepatic lobe in the subcapsular region. Again noted are numerous low-density collections throughout the gastrohepatic ligament region and surrounding the stomach and anterior aspect of the pancreas. There is evidence for intrahepatic biliary dilatation despite the percutaneous drain. Evidence for peritoneal disease in the upper abdomen and increased stranding around left adrenal gland. Musculoskeletal: Multiple old rib fractures. Old clavicle fractures. Review of the MIP images confirms the above findings. IMPRESSION: Increased pulmonary emboli in  the left pulmonary arteries. The large clot burden in the right pulmonary arteries has not significantly changed. Slightly increased right heart strain based on the RV : LV ratio. No significant changes in the upper abdomen with respect to the metastatic cancer and biliary drain. There continues to be evidence for intrahepatic biliary dilatation despite the biliary drain. These results were called by telephone at the time of interpretation on 06/11/2018 at 4:27 pm to Dr. Gerlean Ren , who verbally acknowledged these results. Electronically Signed   By: Markus Daft M.D.   On: 06/11/2018 16:29   Ir Ivc Filter Plmt / S&i /img Guid/mod Sed  Result Date: 06/12/2018 CLINICAL  DATA:  Recurrent pulmonary emboli despite therapeutic anticoagulation. Caval filtration requested. EXAM: INFERIOR VENACAVOGRAM IVC FILTER PLACEMENT UNDER FLUOROSCOPY FLUOROSCOPY TIME:  54 seconds, 283 mGy TECHNIQUE: The procedure, risks (including but not limited to bleeding, infection, organ damage ), benefits, and alternatives were explained to the patient. Questions regarding the procedure were encouraged and answered. The patient understands and consents to the procedure. Patency of the right IJ vein was confirmed with ultrasound with image documentation. An appropriate skin site was determined. Skin site was marked, prepped with chlorhexidine, and draped using maximum barrier technique. The region was infiltrated locally with 1% lidocaine. Intravenous Fentanyl and Versed were administered as conscious sedation during continuous monitoring of the patient's level of consciousness and physiological / cardiorespiratory status by the radiology RN, with a total moderate sedation time of 20 minutes. Under real-time ultrasound guidance, the right IJ vein was accessed with a 21 gauge micropuncture needle; the needle tip within the vein was confirmed with ultrasound image documentation. The needle was exchanged over a 018 guidewire for a transitional dilator, which allow advancement of the [facility] wire into the IVC. A long 6 French vascular sheath was placed for inferior venacavography. This demonstrated no caval thrombus. Renal vein inflows were evident. The [facility] IVC filter was advanced through the sheath and successfully deployed under fluoroscopy at the L2 level. Followup cavagram demonstrates stable filter position and no evident complication. The sheath was removed and hemostasis achieved at the site. No immediate complication. IMPRESSION: 1. Normal IVC. No thrombus or significant anatomic variation. 2. Technically successful infrarenal IVC filter placement. This is a retrievable model. PLAN: This IVC filter is  potentially retrievable. The patient will be assessed for filter retrieval by Interventional Radiology in approximately 8-12 weeks. Further recommendations regarding filter retrieval, continued surveillance or declaration of device permanence, will be made at that time. Electronically Signed   By: Lucrezia Europe M.D.   On: 06/12/2018 07:53   Dg Chest Port 1 View  Result Date: 06/11/2018 CLINICAL DATA:  Dyspnea EXAM: PORTABLE CHEST 1 VIEW COMPARISON:  CT chest 06/11/2018, chest two-view 06/09/2018 FINDINGS: Cardiac enlargement. Negative for heart failure. Negative for pneumonia or effusion. Mild left lower lobe atelectasis. Port-A-Cath tip in the upper right atrium. Chronic clavicle fractures bilaterally. IMPRESSION: Cardiac enlargement without acute abnormality. Electronically Signed   By: Franchot Gallo M.D.   On: 06/11/2018 16:14    Labs:  CBC: Recent Labs    06/11/18 0447 06/12/18 0315 06/13/18 0341 2018/06/29 0758  WBC 10.8* 9.8 10.5 9.7  HGB 9.2* 9.5* 9.4* 10.1*  HCT 29.2* 29.2* 29.4* 31.6*  PLT 473* 499* 522* 533*    COAGS: Recent Labs    05/26/18 1548 05/31/18 0419 06/09/18 1254  INR 1.2* 1.04 1.40  APTT  --   --  31    BMP: Recent Labs  06/11/18 0447 06/12/18 0315 06/13/18 0341 06-30-2018 0758  NA 136 140 138 135  K 3.6 4.3 4.5 3.9  CL 100 105 102 96*  CO2 '26 24 28 27  '$ GLUCOSE 108* 105* 106* 109*  BUN '11 10 11 8  '$ CALCIUM 8.3* 8.5* 8.6* 8.6*  CREATININE 0.72 0.73 0.89 0.77  GFRNONAA >60 >60 >60 >60  GFRAA >60 >60 >60 >60    LIVER FUNCTION TESTS: Recent Labs    06/11/18 0447 06/12/18 0315 06/13/18 0341 June 30, 2018 0758  BILITOT 6.3* 6.6* 6.3* 6.1*  AST 115* 114* 107* 122*  ALT 112* 113* 102* 106*  ALKPHOS 359* 360* 383* 437*  PROT 6.2* 6.5 6.6 7.0  ALBUMIN 2.5* 2.5* 2.6* 2.7*    Assessment and Plan: Pt with hx met gastric ca, recent PE/bilat DVT; Status post IVC filter placement and biliary drain exchange. Site of IVC catheter insertion at internal  jugular is dry and clean. Site of biliary drain is dry, will recommend change of dressing by nursing. T bili continues to slowly decline. 6.1 today.Cont drain irrigation, lab monitoring; send bile for cytology/cx. F/u cholangiogram scheduled on 7/26.    Electronically Signed: D. Rowe Robert, PA-C 2018/06/30, 2:49 PM   I spent a total of 25 Minutes at the the patient's bedside AND on the patient's hospital floor or unit, greater than 50% of which was counseling/coordinating care for biliary drain    Patient ID: [name], male   DOB: 03/17/1961, 57 y.o.   MRN: [id number]

## 2018-07-15 NOTE — Progress Notes (Signed)
[facility] for heparin drip Indication: pulmonary embolus  Allergies  Allergen Reactions  . Cephalexin Other (See Comments)    "Sore throat" REACTION: SORE THROAT  . Other     Bee sting  . Latex Rash  . Tape Rash    Patient Measurements: Height: '5\' 11"'$  (180.3 cm) Weight: 203 lb 4.2 oz (92.2 kg) IBW/kg (Calculated) : 75.3 Heparin Dosing Weight: 86 kg  Vital Signs:  Temp: 97.8 F (36.6 C) (07/01 0800) Temp Source: Oral (07/01 0800) Pulse Rate: 68 (07/01 0600)  Labs: Recent Labs    06/12/18 0315 06/13/18 0341 06-17-2018 0758  HGB 9.5* 9.4* 10.1*  HCT 29.2* 29.4* 31.6*  PLT 499* 522* 533*  HEPARINUNFRC 0.48 0.46 0.36  CREATININE 0.73 0.89  --     Estimated Creatinine Clearance: 110.2 mL/min (by C-G formula based on SCr of 0.89 mg/dL).   Assessment: Pharmacy consulted to dose and monitor heparin in this 57 year old male diagnosed with PE. 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 Per completed med rec and insurance claims, patient is not on any anticoagulation prior to admission. Baseline labs are WNL.  Today, 06-17-18   Heparin level continues to be therapeutic on 1450 units/hr  Heparin off for only ~23mn on 6/28 for IVC filter placement. Filter placed for extension of clot  Per Onc note, recommends continuing heparin through the weekend and change to PO LMWH 7/1  Hgb 9.4,  Plt WNL  No bleeding reported  Goal of Therapy:  Heparin level 0.3-0.7 units/ml Monitor platelets by anticoagulation protocol: Yes   Plan:   Continue heparin infusion of 1450 units/hr  CBC and HL daily  Monitor patient for any signs/symptoms of bleeding  F/u transition to LMWH - Onc recommends heparin through the weekend   JAdrian Saran PharmD, BCPS Pager [phone]-Jul-[id number] AM

## 2018-07-15 DEATH — deceased

## 2018-07-26 ENCOUNTER — Other Ambulatory Visit: Payer: BLUE CROSS/BLUE SHIELD

## 2018-07-26 ENCOUNTER — Ambulatory Visit: Payer: BLUE CROSS/BLUE SHIELD

## 2018-07-26 ENCOUNTER — Ambulatory Visit: Payer: BLUE CROSS/BLUE SHIELD | Admitting: Hematology & Oncology

## 2020-03-22 IMAGING — CT CT ABD-PELV W/ CM
2 of 5 series · 14 of 46 positions shown, 16 images · IV contrast (APPLIED)
Comparison: None.

CLINICAL DATA: Mid and lower abdominal pain for several months.
Nausea and vomiting with hematemesis beginning last night. Personal
history of melanoma and cutaneous squamous cell carcinoma.

EXAM:
CT ABDOMEN AND PELVIS WITH CONTRAST
TECHNIQUE: Multidetector CT imaging of the abdomen and pelvis was performed
using the standard protocol following bolus administration of
intravenous contrast.
CONTRAST:  100mL ROF2NL-BUU IOPAMIDOL (ROF2NL-BUU) INJECTION 61%

[Series 2: axial st · axial · 0.87mm/px · z∈[-545,-65]mm · 11 of 108 slices shown, 13 images]
[im 6/108  soft-tissue]
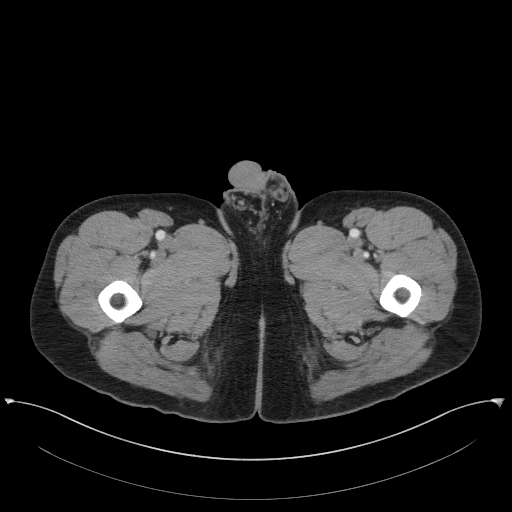
[im 6/108  bone]
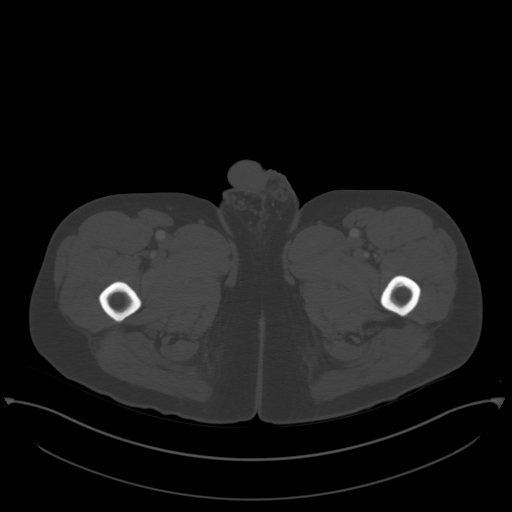
[im 18/108  soft-tissue]
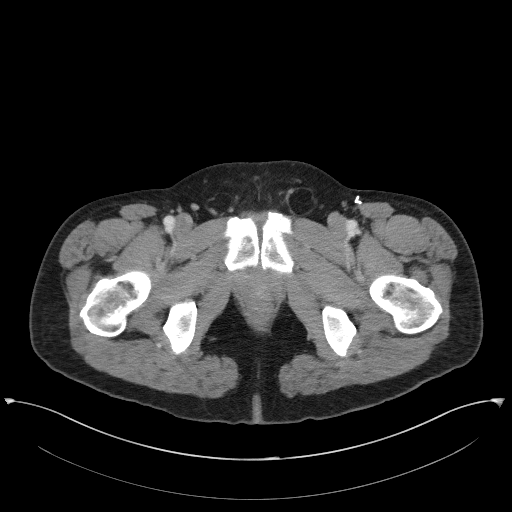
[im 24/108  soft-tissue]
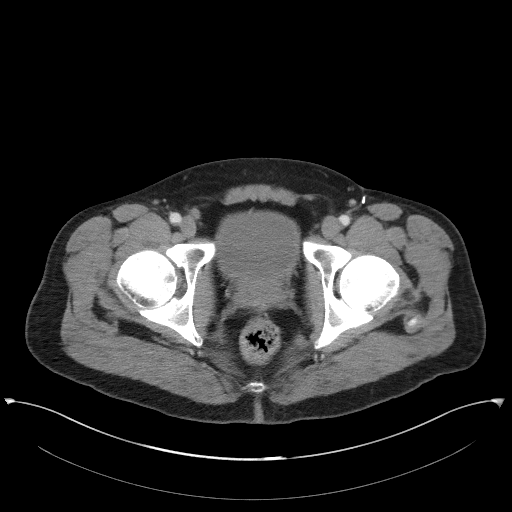
[im 36/108  soft-tissue]
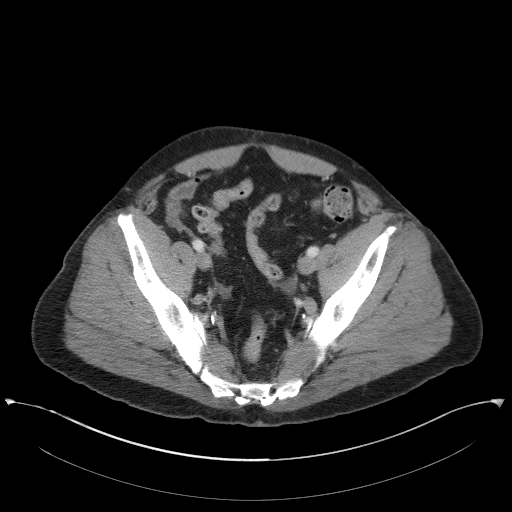
[im 42/108  soft-tissue]
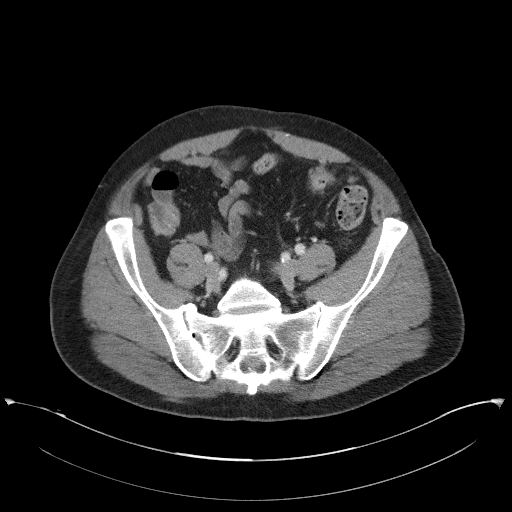
[im 54/108  soft-tissue]
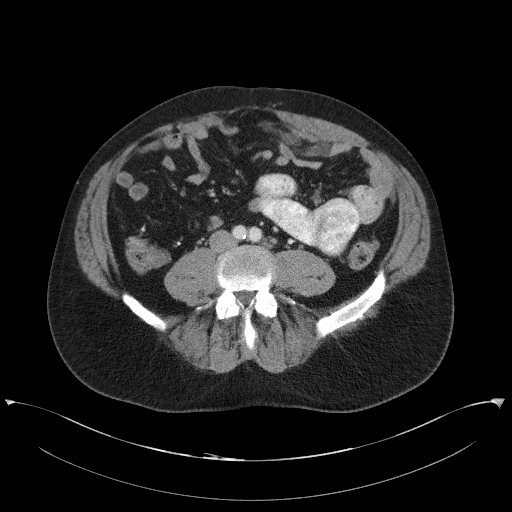
[im 66/108  soft-tissue]
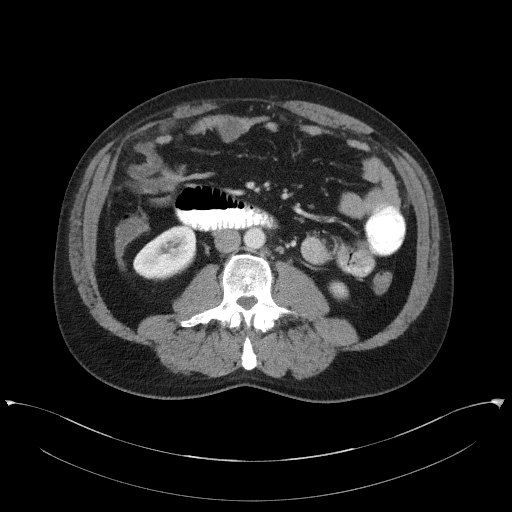
[im 72/108  soft-tissue]
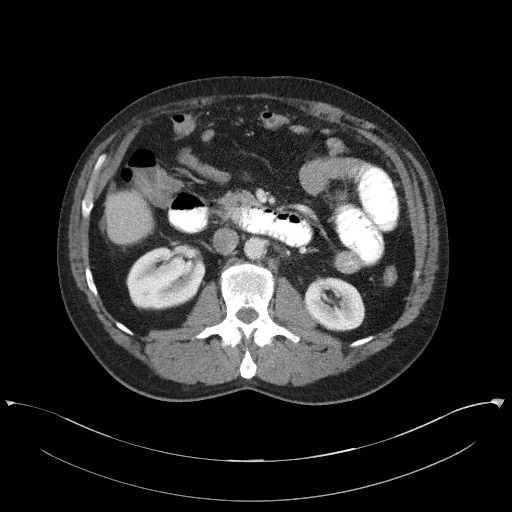
[im 84/108  soft-tissue]
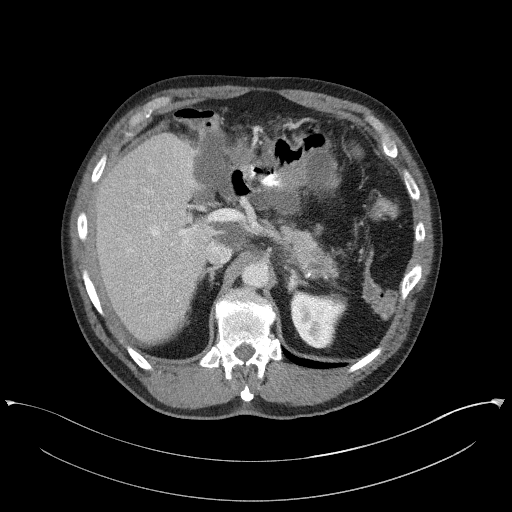
[im 84/108  bone]
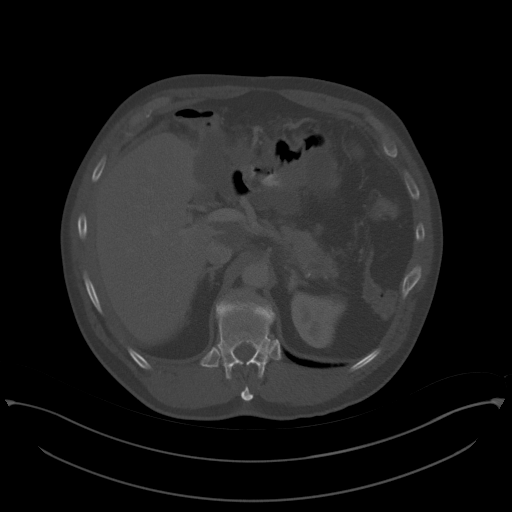
[im 90/108  soft-tissue]
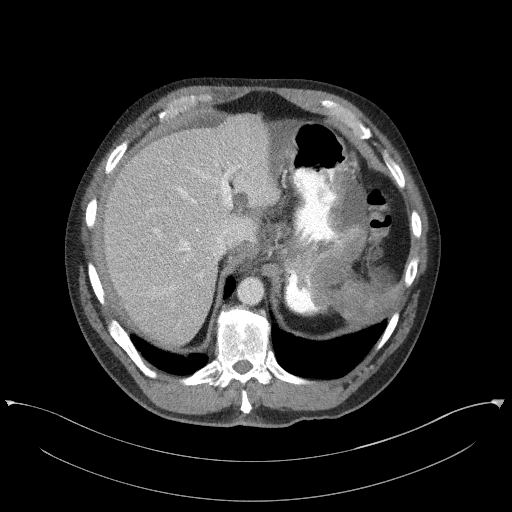
[im 102/108  soft-tissue]
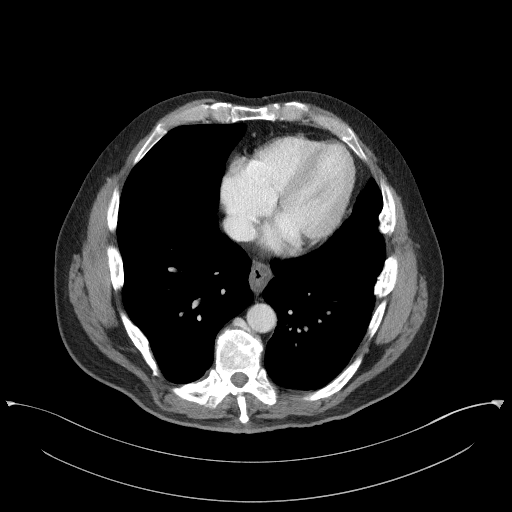

[Series 5: coronal st · coronal · 0.77mm/px · 3 of 102 slices shown]
[im 34/102  soft-tissue]
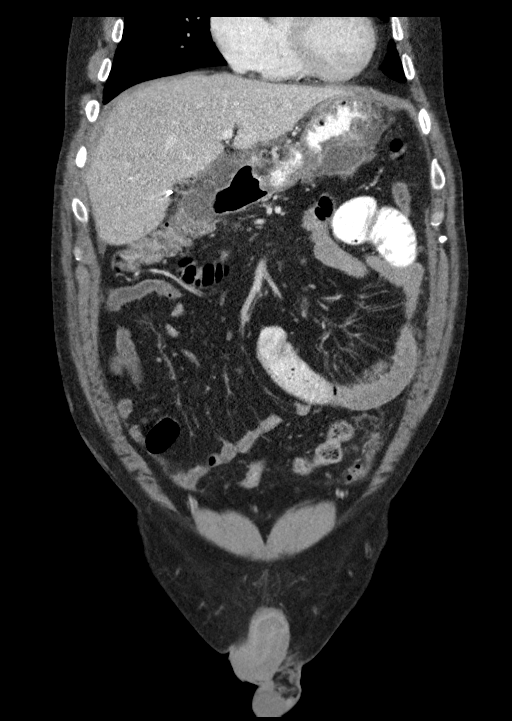
[im 45/102  soft-tissue]
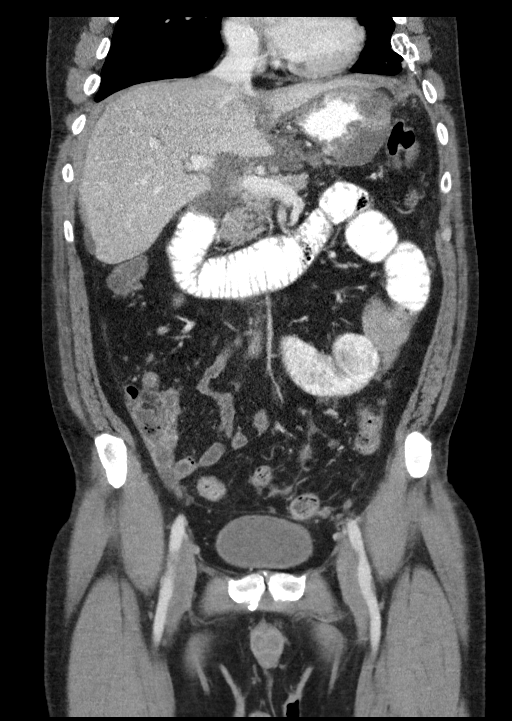
[im 57/102  soft-tissue]
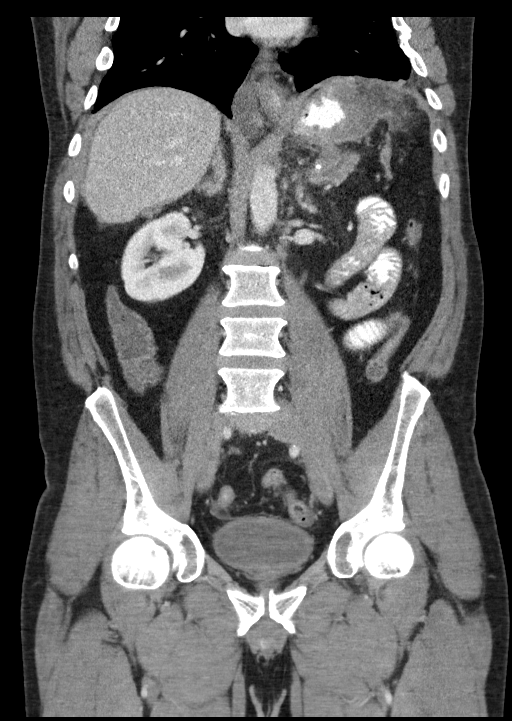

[14 of 46 positions shown; findings below may reference images not displayed]

FINDINGS: Lower Chest: No acute findings. Old bilateral rib fracture
deformities noted.

Hepatobiliary: No hepatic masses identified. Small cysts seen in the
posterior left hepatic lobe. Prior cholecystectomy. No evidence of
biliary obstruction.

Pancreas: Ill-defined low-attenuation mass is seen in the pancreatic
tail measuring 3.3 x 2.0 cm on image [DATE]. Pancreatic ductal
dilatation is seen in the distal pancreatic tail upstream from this
mass.

Spleen: Prior splenectomy. Ill-defined soft tissue mass in the
splenectomy bed which abuts the lateral wall the stomach and
pancreatic tail. This measures 5.8 x 4.2 cm on image [DATE].

Adrenals/Urinary Tract: No masses identified. No evidence of
hydronephrosis.

Stomach/Bowel: Bulky masslike soft tissue density is seen along the
lateral wall of the gastric body and fundus. A fluid density
component is seen along the inferolateral wall of the gastric body
which measures 6.3 x 3.2 cm on image [DATE]. Ill-defined adjacent soft
tissue density is also seen in the left upper quadrant, as described
in the spleen section above. Proximal small bowel loops are
distended by oral contrast, but no transition point is seen
suggesting this is due to bolus ingestion of contrast.

Vascular/Lymphatic: Mild lymphadenopathy is seen in the left
paraaortic region, largest measuring 1.5 cm on image [DATE]. No other
pathologically enlarged lymph nodes identified. No significant
vascular abnormality .

Reproductive:  No mass or other significant abnormality.

Other: Mild ascites is seen. Multilocular fluid collections are seen
in the upper abdomen in the gastrohepatic ligament, porta hepatis,
and portacaval spaces. Mild peritoneal thickening and enhancement is
seen in the right paracolic gutter and dependent portion of the
pelvis. Mild soft tissue stranding and nodularity is also seen in
the region of the greater omentum in the left abdomen. These
findings are suspicious for peritoneal carcinomatosis.

Musculoskeletal: No suspicious bone lesions identified.
IMPRESSION: 3.3 cm masslike soft tissue density in the pancreatic tail with
proximal pancreatic ductal dilatation. This could be due to primary
pancreatic carcinoma or metastatic disease involving the pancreas.

Bulky masslike soft tissue density along the lateral wall the
stomach, and in the adjacent gastrosplenic ligament. This is
suspicious for metastatic disease, although primary gastric
carcinoma cannot be excluded. Consider endoscopy and US for further
evaluation.

Mild ascites with multiloculated fluid collections in the upper
abdomen. Peritoneal and omental soft tissue thickening and
nodularity, highly suspicious for peritoneal carcinomatosis.
